# Patient Record
Sex: Female | Born: 1970
Health system: Southern US, Community
[De-identification: ages and names within clinical notes are randomized; demographics above are authoritative.]

## PROBLEM LIST (undated history)

## (undated) DIAGNOSIS — I341 Nonrheumatic mitral (valve) prolapse: Secondary | ICD-10-CM

## (undated) DIAGNOSIS — T753XXA Motion sickness, initial encounter: Secondary | ICD-10-CM

## (undated) DIAGNOSIS — I890 Lymphedema, not elsewhere classified: Secondary | ICD-10-CM

## (undated) DIAGNOSIS — K621 Rectal polyp: Secondary | ICD-10-CM

## (undated) DIAGNOSIS — Q784 Enchondromatosis: Secondary | ICD-10-CM

## (undated) DIAGNOSIS — IMO0001 Reserved for inherently not codable concepts without codable children: Secondary | ICD-10-CM

## (undated) DIAGNOSIS — R011 Cardiac murmur, unspecified: Secondary | ICD-10-CM

## (undated) DIAGNOSIS — F419 Anxiety disorder, unspecified: Secondary | ICD-10-CM

## (undated) DIAGNOSIS — F319 Bipolar disorder, unspecified: Secondary | ICD-10-CM

## (undated) DIAGNOSIS — Z923 Personal history of irradiation: Secondary | ICD-10-CM

## (undated) DIAGNOSIS — Z9889 Other specified postprocedural states: Secondary | ICD-10-CM

## (undated) DIAGNOSIS — K311 Adult hypertrophic pyloric stenosis: Secondary | ICD-10-CM

## (undated) DIAGNOSIS — N941 Unspecified dyspareunia: Secondary | ICD-10-CM

## (undated) DIAGNOSIS — Z9221 Personal history of antineoplastic chemotherapy: Secondary | ICD-10-CM

## (undated) DIAGNOSIS — Z973 Presence of spectacles and contact lenses: Secondary | ICD-10-CM

## (undated) DIAGNOSIS — Z124 Encounter for screening for malignant neoplasm of cervix: Secondary | ICD-10-CM

## (undated) DIAGNOSIS — D509 Iron deficiency anemia, unspecified: Secondary | ICD-10-CM

## (undated) DIAGNOSIS — K4090 Unilateral inguinal hernia, without obstruction or gangrene, not specified as recurrent: Secondary | ICD-10-CM

## (undated) DIAGNOSIS — R112 Nausea with vomiting, unspecified: Secondary | ICD-10-CM

## (undated) DIAGNOSIS — N921 Excessive and frequent menstruation with irregular cycle: Secondary | ICD-10-CM

## (undated) DIAGNOSIS — G43909 Migraine, unspecified, not intractable, without status migrainosus: Secondary | ICD-10-CM

## (undated) DIAGNOSIS — K219 Gastro-esophageal reflux disease without esophagitis: Secondary | ICD-10-CM

## (undated) HISTORY — PX: NASAL SINUS SURGERY: SHX719

## (undated) HISTORY — DX: Bipolar disorder, unspecified: F31.9

## (undated) HISTORY — DX: Adult hypertrophic pyloric stenosis: K31.1

## (undated) HISTORY — DX: Migraine, unspecified, not intractable, without status migrainosus: G43.909

## (undated) HISTORY — PX: ARTHROSCOPIC REPAIR ACL: SUR80

## (undated) HISTORY — PX: LYMPH NODE BIOPSY: SHX201

## (undated) HISTORY — PX: PYLOROPLASTY: SHX418

## (undated) HISTORY — DX: Reserved for inherently not codable concepts without codable children: IMO0001

## (undated) HISTORY — DX: Gastro-esophageal reflux disease without esophagitis: K21.9

## (undated) HISTORY — PX: TUBAL LIGATION: SHX77

## (undated) HISTORY — DX: Anxiety disorder, unspecified: F41.9

## (undated) HISTORY — DX: Encounter for screening for malignant neoplasm of cervix: Z12.4

---
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
## 1997-11-06 ENCOUNTER — Observation Stay (HOSPITAL_COMMUNITY): Admission: RE | Admit: 1997-11-06 | Discharge: 1997-11-07 | Payer: Self-pay | Admitting: Orthopedic Surgery

## 1998-03-04 ENCOUNTER — Other Ambulatory Visit: Admission: RE | Admit: 1998-03-04 | Discharge: 1998-03-04 | Payer: Self-pay | Admitting: Obstetrics and Gynecology

## 2000-03-11 ENCOUNTER — Other Ambulatory Visit: Admission: RE | Admit: 2000-03-11 | Discharge: 2000-03-11 | Payer: Self-pay | Admitting: Obstetrics and Gynecology

## 2002-01-05 HISTORY — PX: TOE SURGERY: SHX1073

## 2004-01-17 ENCOUNTER — Ambulatory Visit: Payer: Self-pay | Admitting: Family Medicine

## 2006-02-17 ENCOUNTER — Encounter (INDEPENDENT_AMBULATORY_CARE_PROVIDER_SITE_OTHER): Payer: Self-pay | Admitting: Gynecology

## 2006-02-17 ENCOUNTER — Ambulatory Visit: Payer: Self-pay | Admitting: Obstetrics & Gynecology

## 2007-01-03 ENCOUNTER — Ambulatory Visit: Payer: Self-pay | Admitting: Gynecology

## 2007-02-23 ENCOUNTER — Ambulatory Visit: Payer: Self-pay | Admitting: Family Medicine

## 2007-02-23 ENCOUNTER — Encounter: Payer: Self-pay | Admitting: Family Medicine

## 2007-03-19 ENCOUNTER — Emergency Department: Payer: Self-pay | Admitting: Emergency Medicine

## 2007-03-30 ENCOUNTER — Ambulatory Visit: Payer: Self-pay | Admitting: Gynecology

## 2007-04-06 ENCOUNTER — Ambulatory Visit: Payer: Self-pay | Admitting: Obstetrics & Gynecology

## 2008-01-06 DIAGNOSIS — Z8614 Personal history of Methicillin resistant Staphylococcus aureus infection: Secondary | ICD-10-CM

## 2008-01-06 HISTORY — DX: Personal history of Methicillin resistant Staphylococcus aureus infection: Z86.14

## 2008-05-07 ENCOUNTER — Ambulatory Visit: Payer: Self-pay | Admitting: Family Medicine

## 2008-05-28 ENCOUNTER — Ambulatory Visit: Payer: Self-pay | Admitting: Family Medicine

## 2010-04-06 DIAGNOSIS — Z124 Encounter for screening for malignant neoplasm of cervix: Secondary | ICD-10-CM

## 2010-04-06 HISTORY — DX: Encounter for screening for malignant neoplasm of cervix: Z12.4

## 2010-05-20 NOTE — Assessment & Plan Note (Signed)
NAME:  Carolyn Clements NO.:  [id number]   MEDICAL RECORD NO.:  [id number]          PATIENT TYPE:  POB   LOCATION:  [facility] at [facility]         FACILITY:  [facility]   PHYSICIAN:  Tinnie Gens, MD        DATE OF BIRTH:  18-Mar-1970   DATE OF SERVICE:  05/28/2008                                  CLINIC NOTE   CHIEF COMPLAINT:  Retained IUD.   HISTORY OF PRESENT ILLNESS:  The patient is a 40 year old gravida 3,  para 2, who had 3 prior C-sections, who has had an IUD in for 5 years.  She desired replacement.  We have previously tried this in the office on  May 08, 2008, and was unable to get a hold of her IUD.  Her strings have  been too short.  Today, we have medicated her with Cytotec and Valium.  She comes back in for a retrial with presence of a crochet hook from the  GYN Clinic.    On exam, the patient's vitals are as in the chart.  She is a well-  nourished female in no acute distress.  Abdomen is soft, nontender, and  nondistended.   PROCEDURE:  The patient was placed in dorsal lithotomy.  The cervix was  visualized with speculum, was cleaned with Betadine and grasped  anteriorly with a single-tooth tenaculum.  An os finder was used to open  cervix.  A crochet hook is passed x2.  The IUD was felt, retrieved for a  little bit but the strings were then able to be easily visualized and  were grasped and the IUD brought out without difficulty.  After this was  done, the uterus was sounded to 8.5 cm and Mirena IUD was placed without  difficulty.  Strings were trimmed to 2.5 cm and speculum and single-  tooth tenaculum were removed from the cervix.  The patient tolerated the  procedure well.   IMPRESSION:  Intrauterine device retrieval with reinsertion.   PLAN:  Follow up in 2 weeks for IUD string check, alternatively the  patient can check for herself.  If she feels the strings, she can cancel  that appointment.           ______________________________  Tinnie Gens, MD     TP/MEDQ  D:  05/28/2008  T:  05/29/2008  Job:  [id number]

## 2010-05-20 NOTE — Assessment & Plan Note (Signed)
NAME:  Carolyn Clements, Carolyn Clements NO.:  [id number]   MEDICAL RECORD NO.:  [id number]          PATIENT TYPE:  POB   LOCATION:  [facility]         FACILITY:  [facility]   PHYSICIAN:  Tinnie Gens, MD        DATE OF BIRTH:  Sep 30, 1970   DATE OF SERVICE:                                  CLINIC NOTE   CHIEF COMPLAINT:  Yearly exam.   HISTORY OF PRESENT ILLNESS:  The patient is a 40 year old gravida 3,  para 2 who is here for yearly exam.  She also complains today she has a  Mirena IUD, it has been in for 4 years.  She has occasional spotting,  but no significant cycles.  Minimal in the way of cramping.  Last Pap  was 02/2006.   PAST MEDICAL HISTORY:  Significant for GERD and multiple enchondromas  which require yearly follow up with bone scan that is done at an outside  office.   PAST SURGICAL HISTORY:  1. She had an enchondroma removed from her great toe.  2. C-section x2.  3. Knee surgery x 3.  4. Pylorus stenosis repair as an infant.  5. Hernia repair age 40, inguinal.   ALLERGIES:  Sulfa. All narcotics, latex and Macrobid.   MEDICATIONS:  1. Omeprazole b.i.d.  2. Loratadine daily.  3. Multivitamin daily.   FAMILY HISTORY:  Lung cancer, prostate cancer, breast cancer.   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  She has social alcohol use.  No tobacco or drug use.   OB HISTORY:  G3, P2, one SAB at 12.  C section times 2.   GYN HISTORY:  No history of abnormal Pap. Cycles as in HPI.   REVIEW OF SYSTEMS:  A 14 point system reviewed, negative for headache,  vision changes, chest pain, shortness of breath, nausea, vomiting,  diarrhea, constipation, blood in stool, blood in urine, vaginal  discharge, breast masses.   PHYSICAL EXAMINATION:  VITALS:  As noted in the chart.  Blood pressure  111/75, weight 147.  GENERAL:  She is a well-nourished, well-developed female in no acute  distress.  HEENT:  Normocephalic,  atraumatic.  Sclerae anicteric.  NECK:  Supple.  Normal thyroid.  LUNGS:  Clear bilaterally.  CV:  Regular rate and rhythm without rales, gallops or murmurs.  ABDOMEN:  Soft, nontender, nondistended.  EXTREMITIES:  No cyanosis, clubbing, edema.  BREASTS:  Symmetric with everted nipples.  No masses.  No  supraclavicular or axillary adenopathy.  GU:  Normal external female genitalia.  BUS is normal.  Vagina is pink  and rugated.  Cervix is nulliparous without lesion.  Uterus was small,  anteverted.  No adnexal mass or tenderness.   IMPRESSION:  GYN exam.   PLAN:  Pap smear today.  Follow up as needed.           ______________________________  Tinnie Gens, MD     TP/MEDQ  D:  02/23/2007  T:  02/24/2007  Job:  [id number]

## 2010-05-20 NOTE — Assessment & Plan Note (Signed)
NAME:  Carolyn Clements NO.:  [id number]   MEDICAL RECORD NO.:  [id number]          PATIENT TYPE:  POB   LOCATION:  [facility]         FACILITY:  [facility]   PHYSICIAN:  Tinnie Gens, MD        DATE OF BIRTH:  13-Aug-1970   DATE OF SERVICE:                                  CLINIC NOTE   CHIEF COMPLAINT:  IUD change.   HISTORY OF PRESENT ILLNESS:  The patient is a 40 year old gravida 3,  para 2, who has had 2 prior C-sections, who has an IUD in place since  June of 2005.  Her IUD is placed by Dr. Mia Creek and strings were unable  to visualize today.   PROCEDURE:  A speculum was placed inside the vagina.  Cervix was noted  to be very small and IUD strings could not be visualized.  A OS Finder  was used to dilate of the cervix and then multiple passes with a  straight and curved grasper were used to try to locate her IUD, however,  cannot be easily located.  The patient tolerated the procedure well,  however, we could not ever get her old IUD to come out.  A second IUD  was not placed because of this, and the patient will be booked in the  OR.   IMPRESSION:  Retained intrauterine device.   PLAN:  The patient's IUD removal with replacement.  We will schedule  this in the OR, where the patient will be more comfortable and further  instruments and ability to dilate her cervix can be used to retrieve her  old IUD.  The patient did undergo ultrasound recently in the hospital  that showed her IUD to be in perfect place, so I suspect it will be an  easy enough procedure.  Once we are able to do that, we will book her in  the next 2-3 weeks.           ______________________________  Tinnie Gens, MD     TP/MEDQ  D:  05/07/2008  T:  05/08/2008  Job:  [id number]

## 2010-08-25 ENCOUNTER — Other Ambulatory Visit: Payer: Self-pay | Admitting: *Deleted

## 2010-08-25 NOTE — Telephone Encounter (Signed)
Ok to refill 

## 2010-08-26 MED ORDER — ALPRAZOLAM ER 0.5 MG PO TB24: 0.5000 mg | ORAL_TABLET | Freq: Every day | ORAL | Status: DC

## 2010-08-26 NOTE — Telephone Encounter (Signed)
Rx called to pharmacy

## 2010-09-05 ENCOUNTER — Other Ambulatory Visit: Payer: Self-pay | Admitting: Internal Medicine

## 2010-09-09 ENCOUNTER — Other Ambulatory Visit: Payer: Self-pay | Admitting: Internal Medicine

## 2010-09-09 MED ORDER — OMEPRAZOLE 20 MG PO CPDR: 20.0000 mg | DELAYED_RELEASE_CAPSULE | Freq: Two times a day (BID) | ORAL | Status: DC

## 2010-09-10 ENCOUNTER — Telehealth: Payer: Self-pay | Admitting: *Deleted

## 2010-09-10 DIAGNOSIS — B373 Candidiasis of vulva and vagina: Secondary | ICD-10-CM

## 2010-09-10 MED ORDER — FLUCONAZOLE 150 MG PO TABS: 150.0000 mg | ORAL_TABLET | Freq: Every day | ORAL | Status: AC

## 2010-09-10 NOTE — Telephone Encounter (Signed)
rx sent to pharmacy

## 2010-09-10 NOTE — Telephone Encounter (Signed)
Patient says that she has a yeast infection and is asking if she can have med called in. She also says that she always requires two pills instead of one. [facility] [place]

## 2010-09-22 ENCOUNTER — Other Ambulatory Visit: Payer: Self-pay | Admitting: Internal Medicine

## 2010-09-23 MED ORDER — BUPROPION HCL ER (SR) 100 MG PO TB12: 100.0000 mg | ORAL_TABLET | Freq: Two times a day (BID) | ORAL | Status: DC

## 2010-09-25 ENCOUNTER — Other Ambulatory Visit: Payer: Self-pay | Admitting: Internal Medicine

## 2010-09-26 ENCOUNTER — Other Ambulatory Visit: Payer: Self-pay | Admitting: *Deleted

## 2010-09-26 MED ORDER — ALPRAZOLAM ER 0.5 MG PO TB24: 0.5000 mg | ORAL_TABLET | Freq: Every day | ORAL | Status: AC

## 2010-09-26 MED ORDER — ALPRAZOLAM 0.5 MG PO TABS: 0.5000 mg | ORAL_TABLET | Freq: Every evening | ORAL | Status: DC | PRN

## 2010-09-26 NOTE — Telephone Encounter (Signed)
Opened in error

## 2010-11-24 ENCOUNTER — Ambulatory Visit (INDEPENDENT_AMBULATORY_CARE_PROVIDER_SITE_OTHER): Payer: BC Managed Care – PPO | Admitting: Internal Medicine

## 2010-11-24 ENCOUNTER — Encounter: Payer: Self-pay | Admitting: Internal Medicine

## 2010-11-24 DIAGNOSIS — Z124 Encounter for screening for malignant neoplasm of cervix: Secondary | ICD-10-CM | POA: Insufficient documentation

## 2010-11-24 DIAGNOSIS — Z538 Procedure and treatment not carried out for other reasons: Secondary | ICD-10-CM | POA: Insufficient documentation

## 2010-11-24 DIAGNOSIS — K5900 Constipation, unspecified: Secondary | ICD-10-CM

## 2010-11-24 DIAGNOSIS — F419 Anxiety disorder, unspecified: Secondary | ICD-10-CM | POA: Insufficient documentation

## 2010-11-24 DIAGNOSIS — K219 Gastro-esophageal reflux disease without esophagitis: Secondary | ICD-10-CM | POA: Insufficient documentation

## 2010-11-24 DIAGNOSIS — G43909 Migraine, unspecified, not intractable, without status migrainosus: Secondary | ICD-10-CM | POA: Insufficient documentation

## 2010-11-24 LAB — TSH: TSH: 1.24 u[IU]/mL (ref 0.35–5.50)

## 2010-11-24 MED ORDER — ALPRAZOLAM ER 0.5 MG PO TB24: 0.5000 mg | ORAL_TABLET | ORAL | Status: AC

## 2010-11-24 MED ORDER — BUPROPION HCL 75 MG PO TABS: 75.0000 mg | ORAL_TABLET | Freq: Two times a day (BID) | ORAL | Status: DC

## 2010-11-24 NOTE — Progress Notes (Signed)
  Subjective:    Patient ID: Carolyn Clements, female    DOB: 05-15-1970, 40 y.o.   MRN: [id number]  HPI   40 yo white female with history of generalized anxiety want sto start getting of of meds..  Has not been using xanax at bedtime ,  Only the XR,  And yusing wellbutrin 100 mg twice daiy.  Notes constipation ,  Weight gain.,  No hair loss or dry skin.   6 lbs despite doing cardio 3 to  5 times week, and teaching kick boxing.   Review of Systems  Constitutional: Positive for unexpected weight change. Negative for fever and chills.  HENT: Negative for hearing loss, ear pain, nosebleeds, congestion, sore throat, facial swelling, rhinorrhea, sneezing, mouth sores, trouble swallowing, neck pain, neck stiffness, voice change, postnasal drip, sinus pressure, tinnitus and ear discharge.   Eyes: Negative for pain, discharge, redness and visual disturbance.  Respiratory: Negative for cough, chest tightness, shortness of breath, wheezing and stridor.   Cardiovascular: Negative for chest pain, palpitations and leg swelling.  Gastrointestinal: Positive for constipation.  Musculoskeletal: Negative for myalgias and arthralgias.  Skin: Negative for color change and rash.  Neurological: Negative for dizziness, weakness, light-headedness and headaches.  Hematological: Negative for adenopathy.       Objective:   Physical Exam  Constitutional: She is oriented to person, place, and time. She appears well-developed and well-nourished.  HENT:  Mouth/Throat: Oropharynx is clear and moist.  Eyes: EOM are normal. Pupils are equal, round, and reactive to light. No scleral icterus.  Neck: Normal range of motion. Neck supple. No JVD present. No thyromegaly present.  Cardiovascular: Normal rate, regular rhythm, normal heart sounds and intact distal pulses.   Pulmonary/Chest: Effort normal and breath sounds normal.  Abdominal: Soft. Bowel sounds are normal. She exhibits no mass. There is no tenderness.    Musculoskeletal: Normal range of motion. She exhibits no edema.  Lymphadenopathy:    She has no cervical adenopathy.  Neurological: She is alert and oriented to person, place, and time.  Skin: Skin is warm and dry.  Psychiatric: She has a normal mood and affect.          Assessment & Plan:  Depression with anxiety features:   Aggravated by loss of father to lung CA ealrier in the year.  She is requesting reductio in medications as a trial.  Will reduce wellbutrin dose in PM to 75 mg, followed by am dose to 75 mg.  Next step would be to stop the alprazolam or continue reducing wellbutrin.    Weight gain: given concurrent constipation, will rule out thyroid dysfunction.  Discussed diet and current exercise regimen

## 2010-11-24 NOTE — Patient Instructions (Signed)
Reduce the afternoon dose of wellbutrin to 75 mg for one week,  Then reduce morning dose to 75 if tolerated.  Try the Atkins chocolate lovers variety of  protein bars at [employer].  (130 cal, 11 g fiber,  Lots of protein,  Low carb).  We will check your thyroid function today

## 2010-11-26 ENCOUNTER — Encounter: Payer: Self-pay | Admitting: Internal Medicine

## 2010-12-11 ENCOUNTER — Encounter: Payer: Self-pay | Admitting: Internal Medicine

## 2011-01-09 ENCOUNTER — Other Ambulatory Visit: Payer: Self-pay | Admitting: Internal Medicine

## 2011-02-23 ENCOUNTER — Telehealth: Payer: Self-pay | Admitting: *Deleted

## 2011-02-23 MED ORDER — ALPRAZOLAM 0.25 MG PO TABS: 0.2500 mg | ORAL_TABLET | Freq: Every evening | ORAL | Status: DC | PRN

## 2011-02-23 MED ORDER — BUPROPION HCL 75 MG PO TABS: 37.5000 mg | ORAL_TABLET | Freq: Two times a day (BID) | ORAL | Status: DC

## 2011-02-23 NOTE — Telephone Encounter (Signed)
The alprazolam in her chart is not extended release it is the short acting,.  The extended does not come in a 0.25 , if she breaks the extended release in half it becomes short acting,

## 2011-02-23 NOTE — Telephone Encounter (Signed)
You can call in the alprazolam at the 0.25 mg dose #30 with 2 refills.  The wellbutrin does not come in a lower dose than 75 mg but I  Have sent a new rx for 1/2 tablet bid to pharmacy

## 2011-02-23 NOTE — Telephone Encounter (Signed)
Pt is asking if she can lower her doses on wellbutrin and xanax.  She is currently taking 75 mg's of wellbutrin twice a day and one .5 mg tablet of extended release xanax.  If her xanax dose is to stay the same she will need a refill called to her pharmacy.  Please advise.

## 2011-02-24 ENCOUNTER — Other Ambulatory Visit: Payer: Self-pay | Admitting: *Deleted

## 2011-02-24 MED ORDER — FLUTICASONE PROPIONATE 50 MCG/ACT NA SUSP: 2.0000 | Freq: Every day | NASAL | Status: DC

## 2011-02-25 ENCOUNTER — Telehealth: Payer: Self-pay | Admitting: Internal Medicine

## 2011-02-25 NOTE — Telephone Encounter (Signed)
Should she stop taking her Alprazolam E R or should she refill.

## 2011-02-25 NOTE — Telephone Encounter (Signed)
Pt states she has both extended release, which she takes every day, and short acting xanax, which she takes very seldom.  She has several of the .5 mg short acting so she will break these in half and take half a tablet every day.  She'll call back in about a month to report on how she's doing.

## 2011-02-25 NOTE — Telephone Encounter (Signed)
Not sure I can answer that without seeing patient,  If she is taking the lowest dose once a day and doesn't freel she needs it,  Then she can stop it.,

## 2011-02-25 NOTE — Telephone Encounter (Signed)
See other phone note from today for more information.

## 2011-04-09 ENCOUNTER — Telehealth: Payer: Self-pay | Admitting: Internal Medicine

## 2011-04-09 DIAGNOSIS — N760 Acute vaginitis: Secondary | ICD-10-CM

## 2011-04-09 MED ORDER — FLUCONAZOLE 150 MG PO TABS: 150.0000 mg | ORAL_TABLET | Freq: Every day | ORAL | Status: AC

## 2011-04-09 NOTE — Telephone Encounter (Signed)
Patient notified of Rx.  

## 2011-04-09 NOTE — Telephone Encounter (Signed)
Office Message [address] [place], [place] [address] p. [phone] f. [phone] To: [facility] (Daytime Triage) Fax: [phone] From: [facility] Date/ Time: 04/09/2011 9:18 AM Taken By: Patriciaann Clan, RN Caller: Anina Facility: Not Collected Patient: Carolyn Clements, Carolyn Clements DOB: 1970-11-19 Phone: [phone] Reason for Call: Caller was unable to be reached on callback - Left Message Regarding Appointment: No Appt Date: Appt Time: Unknown Provider: Reason: Details: Outcome:

## 2011-04-09 NOTE — Telephone Encounter (Signed)
[facility] [place]

## 2011-04-09 NOTE — Telephone Encounter (Signed)
Caller: Dakayla/Mother; PCP: Duncan Dull; CB#: [phone];  LMP-Mirena IUD.  Patient states she developed vaginal itching/irritation,white vaginal discharge. Onset 04/08/11. Patient requesting Rx for Diflucan. States she has hx of yeast infections. Triage per Vaginal Discharge/Irritation Protocol. No emergent sx identified. Care advice given per guidelines. Patient verbalizes understanding.  PATIENT REQUESTING RX FOR DICFLUCAN. [facility] [place] @ [phone]. PATIENT CAN BE REACHED @ [phone].

## 2011-04-09 NOTE — Telephone Encounter (Signed)
[facility] Triage Call Report Triage Record Num: [id number] Operator: Patriciaann Clan Patient Name: Carolyn Clements Call Date & Time: 04/09/2011 11:43:54AM Patient Phone: [phone] PCP: Duncan Dull Patient Gender: Female PCP Fax : [phone] Patient DOB: 04-08-1970 Practice Name: [facility] Reason for Call: Caller: Junior/Mother; PCP: Duncan Dull; CB#: [phone]; LMP-Mirena IUD. Patient states she developed vaginal itching/irritation,white vaginal discharge. Onset 04/08/11. Patient requesting Rx for Diflucan. States she has hx of yeast infections. Triage per Vaginal Discharge/Irritation Protocol. No emergent sx identified. Care advice given per guidelines. Patient verbalizes understanding. PATIENT REQUESTING RX FOR DICFLUCAN. [facility] [place] @ [phone]. PATIENT CAN BE REACHED @ [phone]. Protocol(s) Used: Vaginal Discharge or Irritation Recommended Outcome per Protocol: See Provider within 24 hours Reason for Outcome: Genital itching, burning or redness Care Advice: ~ Keep perineum clean and dry. ~ Call provider if symptoms worsen or new symptoms develop. ~ SYMPTOM / CONDITION MANAGEMENT Refrain from douching, using scented deodorant tampons, or nonprescription medication until evaluated by provider. Do not use feminine hygiene sprays. Use condoms during sex. ~ 04/09/2011 12:06:03PM Page 1 of 1 [email or web address]

## 2011-05-11 ENCOUNTER — Other Ambulatory Visit: Payer: Self-pay | Admitting: Internal Medicine

## 2011-06-03 ENCOUNTER — Telehealth: Payer: Self-pay | Admitting: Internal Medicine

## 2011-06-03 DIAGNOSIS — B373 Candidiasis of vulva and vagina: Secondary | ICD-10-CM

## 2011-06-03 MED ORDER — FLUCONAZOLE 150 MG PO TABS: 150.0000 mg | ORAL_TABLET | Freq: Every day | ORAL | Status: AC

## 2011-06-03 NOTE — Telephone Encounter (Signed)
I sent an rx for oral fuconazole to her pharmacy ,.  Take daily for 2 days,  If not rexolved after this, ask her to  make appt for pelvic exam.

## 2011-06-03 NOTE — Telephone Encounter (Signed)
Caller: Xanthe/Patient; PCP: Duncan Dull; CB#: [phone]; ; ; Call regarding Yeast Infection; notes vaginal irritation 06/02/11.  Per protocol, emergent symptoms denied; advised appt within 24 hours.  Nno appts available with any provider until 06/08/11;  patient reluctant to go to UC for this.  Info to office for provider review/Rx/callback.  [facility] [phone].   MAY REACH PATIENT AT [phone].

## 2011-06-04 NOTE — Telephone Encounter (Signed)
Patient notified

## 2011-06-17 ENCOUNTER — Other Ambulatory Visit: Payer: Self-pay | Admitting: Internal Medicine

## 2011-06-17 MED ORDER — ALPRAZOLAM 0.25 MG PO TABS: 0.2500 mg | ORAL_TABLET | Freq: Every evening | ORAL | Status: DC | PRN

## 2011-07-07 ENCOUNTER — Ambulatory Visit (INDEPENDENT_AMBULATORY_CARE_PROVIDER_SITE_OTHER): Payer: BC Managed Care – PPO | Admitting: Internal Medicine

## 2011-07-07 ENCOUNTER — Encounter: Payer: Self-pay | Admitting: Internal Medicine

## 2011-07-07 VITALS — BP 110/58 | HR 73 | Temp 97.8°F | Resp 16 | Wt 145.2 lb

## 2011-07-07 DIAGNOSIS — L02429 Furuncle of limb, unspecified: Secondary | ICD-10-CM

## 2011-07-07 DIAGNOSIS — F5102 Adjustment insomnia: Secondary | ICD-10-CM

## 2011-07-07 NOTE — Patient Instructions (Signed)
For your insomnia, try taking the alprazolam 0.25 mg when you wake up for the second time.   Will refill your medication once you call to let me know it is working .

## 2011-07-07 NOTE — Assessment & Plan Note (Addendum)
Currently on doxycycline by dermatologist, pending cultures. Discussed using hibiclens once a week and Lever 2000 soap.

## 2011-07-07 NOTE — Progress Notes (Signed)
Patient ID: Carolyn Clements, female   DOB: Jun 08, 1970, 41 y.o.   MRN: [id number]  Patient Active Problem List  Diagnosis  . GERD (gastroesophageal reflux disease)  . Migraine headache  . IUD  . Screening for cervical cancer  . Insomnia, transient    Subjective:  CC:   Chief Complaint  Patient presents with  . Insomnia    HPI:   Carolyn Clements a 41 y.o. female who presents New onset insomnia.  She is waking up every 2 to 3 hours. She has tried earplugs, noise machines, limiting evening water intake. Following all practices of good sleep hygiene.  Off of all meds for the last several months.  She denies any symptoms of restless legs.  No  anxiety issues keeping her awake.  Sleeps in the fetal position. Symptoms also occurred while on vacation.  2nd issue is recent culture of a boil on right knee which was positive for suspected MRSA, and she is now on doxycyline.  This is her second infection.     Past Medical History  Diagnosis Date  . Pyloric stenosis infant    Inguinal Hernia repair  . Anxiety   . GERD (gastroesophageal reflux disease)   . Migraine headache   . IUD     [facility]  Dr Shawnie Pons  . Screening for cervical cancer April 2012    normal PAP     Past Surgical History  Procedure Date  . Arthroscopic repair acl     torn/repaired ACL, followed by patellar fractuer, [employer]  . Cesarean section     2003 and 2004  . Hernia repair 1976  . Pyloroplasty 1974         The following portions of the patient's history were reviewed and updated as appropriate: Allergies, current medications, and problem list.    Review of Systems:   12 Pt  review of systems was negative except those addressed in the HPI,     History   Social History  . Marital Status: Married    Spouse Name: N/A    Number of Children: N/A  . Years of Education: N/A   Occupational History  . physical therapist     Physical Therapist   Social History  Main Topics  . Smoking status: Never Smoker   . Smokeless tobacco: Never Used  . Alcohol Use: Yes  . Drug Use: No  . Sexually Active: Not on file   Other Topics Concern  . Not on file   Social History Narrative  . No narrative on file    Objective:  BP 110/58  Pulse 73  Temp 97.8 F (36.6 C) (Oral)  Resp 16  Wt 145 lb 4 oz (65.885 kg)  SpO2 99%  General appearance: alert, cooperative and appears stated age Ears: normal TM's and external ear canals both ears Throat: lips, mucosa, and tongue normal; teeth and gums normal Neck: no adenopathy, no carotid bruit, supple, symmetrical, trachea midline and thyroid not enlarged, symmetric, no tenderness/mass/nodules Back: symmetric, no curvature. ROM normal. No CVA tenderness. Lungs: clear to auscultation bilaterally Heart: regular rate and rhythm, S1, S2 normal, no murmur, click, rub or gallop Abdomen: soft, non-tender; bowel sounds normal; no masses,  no organomegaly Pulses: 2+ and symmetric Skin: Skin color, texture, turgor normal. No rashes or lesions Lymph nodes: Cervical, supraclavicular, and axillary nodes normal.  Assessment and Plan:  Insomnia, transient Her early morning wakening is cutting her sleep cycle short.  Since she  has tolerated alprazolam in the past, suggested using alprazolam for early waking.   Furuncle of knee Currently on doxycycline by dermatologist, pending cultures. Discussed using hibiclens once a week and Lever 2000 soap.    Updated Medication List Outpatient Encounter Prescriptions as of 07/07/2011  Medication Sig Dispense Refill  . ALPRAZolam (XANAX) 0.25 MG tablet Take 1 tablet (0.25 mg total) by mouth at bedtime as needed for sleep or anxiety.  30 tablet  3  . doxycycline (VIBRAMYCIN) 100 MG capsule Take 100 mg by mouth 2 (two) times daily. X 10 days      . fexofenadine (ALLEGRA) 180 MG tablet Take 180 mg by mouth daily.        . fluticasone (FLONASE) 50 MCG/ACT nasal spray Place 2 sprays into  the nose daily.  16 g  1  . ketotifen (ZADITOR) 0.025 % ophthalmic solution Place 1 drop into both eyes 2 (two) times daily.        . meloxicam (MOBIC) 15 MG tablet Take 15 mg by mouth daily.        . Multiple Vitamin (MULTIVITAMIN) tablet Take 1 tablet by mouth daily.        Marland Kitchen omeprazole (PRILOSEC) 20 MG capsule TAKE 1 CAPSULE (20 MG TOTAL) BY MOUTH 2 (TWO) TIMES DAILY.  60 capsule  3  . Probiotic Product (PROBIOTIC FORMULA PO) Take by mouth.        . DISCONTD: buPROPion (WELLBUTRIN SR) 100 MG 12 hr tablet Take 1 tablet (100 mg total) by mouth 2 (two) times daily.  60 tablet  3  . DISCONTD: buPROPion (WELLBUTRIN) 75 MG tablet Take 0.5 tablets (37.5 mg total) by mouth 2 (two) times daily.  60 tablet  6

## 2011-07-07 NOTE — Assessment & Plan Note (Signed)
Her early morning wakening is cutting her sleep cycle short.  Since she has tolerated alprazolam in the past, suggested using alprazolam for early waking.

## 2011-07-08 ENCOUNTER — Ambulatory Visit: Payer: BC Managed Care – PPO | Admitting: Internal Medicine

## 2011-07-22 ENCOUNTER — Other Ambulatory Visit: Payer: Self-pay | Admitting: Internal Medicine

## 2011-09-09 ENCOUNTER — Other Ambulatory Visit: Payer: Self-pay | Admitting: Internal Medicine

## 2011-10-25 ENCOUNTER — Other Ambulatory Visit: Payer: Self-pay | Admitting: Internal Medicine

## 2011-10-26 NOTE — Telephone Encounter (Signed)
Ok to refill,  Authorized in epic and printed  

## 2011-10-28 NOTE — Telephone Encounter (Signed)
rx called into pharmacy

## 2011-11-04 ENCOUNTER — Telehealth: Payer: Self-pay | Admitting: Internal Medicine

## 2011-11-04 DIAGNOSIS — N760 Acute vaginitis: Secondary | ICD-10-CM

## 2011-11-04 MED ORDER — FLUCONAZOLE 150 MG PO TABS: 150.0000 mg | ORAL_TABLET | Freq: Every day | ORAL | Status: DC

## 2011-11-04 NOTE — Telephone Encounter (Signed)
LMP none/IUD.  Caller: Carolyn Clements/Patient; Patient Name: Carolyn Clements; PCP: Duncan Dull (Adults only); Best Callback Phone Number: [phone].  Patient calling about symptoms of yeast infection.  Onset 10/31/11.  States she has these a couple of times a year.  c/o itching, irritation.  Per protocol, emergent symptoms denied; advised appt within 24 hours.  Declines; states Dr. Darrick Huntsman usually calls something in for her.  info to office for provider review/Rx/callback. [facility]  [phone].   May reach patient at [phone] cell/may leave message if she is with a patient.

## 2011-11-04 NOTE — Telephone Encounter (Signed)
[facility] [place]

## 2012-01-07 ENCOUNTER — Ambulatory Visit (INDEPENDENT_AMBULATORY_CARE_PROVIDER_SITE_OTHER): Payer: BC Managed Care – PPO | Admitting: Internal Medicine

## 2012-01-07 ENCOUNTER — Encounter: Payer: Self-pay | Admitting: Internal Medicine

## 2012-01-07 VITALS — BP 96/64 | HR 70 | Temp 98.0°F | Resp 16 | Ht 63.0 in | Wt 146.0 lb

## 2012-01-07 DIAGNOSIS — F411 Generalized anxiety disorder: Secondary | ICD-10-CM

## 2012-01-07 DIAGNOSIS — F419 Anxiety disorder, unspecified: Secondary | ICD-10-CM

## 2012-01-07 MED ORDER — SERTRALINE HCL 50 MG PO TABS: 50.0000 mg | ORAL_TABLET | Freq: Every day | ORAL | Status: DC

## 2012-01-07 MED ORDER — ALPRAZOLAM 0.25 MG PO TABS: 0.2500 mg | ORAL_TABLET | Freq: Every evening | ORAL | Status: DC | PRN

## 2012-01-07 NOTE — Patient Instructions (Addendum)
Start the sertraline at 1/2 tablet daily  With dinner,  And   Increase to 1 tablet after 1 week   Continue wellbutrin in the morning only  for one week , then every other day for one week then stop   Continue alprazolam as needed

## 2012-01-07 NOTE — Progress Notes (Signed)
Patient ID: Carolyn Clements, female   DOB: Mar 08, 1970, 42 y.o.   MRN: [id number]  Patient Active Problem List  Diagnosis  . GERD (gastroesophageal reflux disease)  . Migraine headache  . IUD  . Screening for cervical cancer  . Insomnia, transient  . Furuncle of knee  . Generalized anxiety disorder    Subjective:  CC:   Chief Complaint  Patient presents with  . sleep problems  . discuss medication    HPI:   Tomorrow Avent Mimsis a 42 y.o. female who presents Uncontrolled anxiety. Patient weaned herself off of the Wellbutrin several months ago as directed but noticed that after 2 months she was having increased problems with concentration and increased irritability. She is tolerating a morning dose but notes when she takes the afternoon dose she has increased insomnia. She still using the Xanax to initiate sleep. 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 She has been doing a lot of reflecting on her childhood and her current emotional state. She is concerned about her interaction with her children being too controlling and recognizes that her childhood was very controlled. She states that she has been 42 years old since she was a child. She remembers never been allowed adequate a child but was constantly being covered by her father and corrected and regulated to prevent her from making any mistakes.  Her first concern is being a super wife and super mom and she frequently and neglects herself. 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  Her mother recently made the comment that she  she has a more relaxed style with her children and with her up to the house. Rather than appreciated a comment she found that observation somehow an  indictment on her housekeeping skills.  She and her husband have been  working overtime for the last 6 months and do not have much time by themselves.  Mostly Dad who was in charge but not often there. Dad was Buyer, retail,  Mom was stay at home mom and appeased Dad.  "dad was my best friend" and she is just like him. She does does not have any signs or symptoms of obsessive-compulsive behaviors but does have a compulsive personality and is resentful that her childhood was not more spontaneous. She does not want to make the same stick with her children it she is annoyed by her children's failure to act responsibly. Children are 48 and 61 years old. She is requesting pharmacotherapy and referral to psychotherapy    Past Medical History  Diagnosis Date  . Pyloric stenosis infant    Inguinal Hernia repair  . Anxiety   . GERD (gastroesophageal reflux disease)   . Migraine headache   . IUD     [facility]  Dr Shawnie Pons  . Screening for cervical cancer April 2012    normal PAP     Past Surgical History  Procedure Date  . Arthroscopic repair acl     torn/repaired ACL, followed by patellar fractuer, [employer] school [employer]  . Cesarean section     2003 and 2004  . Hernia repair 1976  . Pyloroplasty 1974         The following portions of the patient's history were reviewed and updated  as appropriate: Allergies, current medications, and problem list.    Review of Systems:   12 Pt  review of systems was negative except those addressed in the HPI,     History   Social History  . Marital Status: Married    Spouse Name: N/A    Number of Children: N/A  . Years of Education: N/A   Occupational History  . physical therapist     Physical Therapist   Social History Main Topics  . Smoking status: Never Smoker   . Smokeless tobacco: Never Used  . Alcohol Use: 1.8 oz/week    3 Shots of liquor per week  . Drug Use: No  . Sexually Active: Not on file   Other Topics Concern  . Not on file   Social History Narrative  . No narrative on  file    Objective:  BP 96/64  Pulse 70  Temp 98 F (36.7 C) (Oral)  Resp 16  Ht 5\' 3"  (1.6 m)  Wt 146 lb (66.225 kg)  BMI 25.86 kg/m2  SpO2 97%  General appearance: alert, cooperative and appears stated age Ears: normal TM's and external ear canals both ears Throat: lips, mucosa, and tongue normal; teeth and gums normal Neck: no adenopathy, no carotid bruit, supple, symmetrical, trachea midline and thyroid not enlarged, symmetric, no tenderness/mass/nodules Back: symmetric, no curvature. ROM normal. No CVA tenderness. Lungs: clear to auscultation bilaterally Heart: regular rate and rhythm, S1, S2 normal, no murmur, click, rub or gallop Abdomen: soft, non-tender; bowel sounds normal; no masses,  no organomegaly Pulses: 2+ and symmetric Skin: Skin color, texture, turgor normal. No rashes or lesions Lymph nodes: Cervical, supraclavicular, and axillary nodes normal. Psych: tearful, makes eye contact.  Affect appropriate.   Ahssessand looking ment and Plan:  Generalized anxiety disorder To 30 minutes discussing her current symptoms , her childhood memories and her relationship with her father. She has obsessive-compulsive personality traits and controlling behavior. Up to this point in her life these traits have been beneficial in achieving career goals but are currently interfering with her happiness sleep cycle and interaction with her children. We discussed at length today how this  can be managed. I'm recommending a trial of sertraline and a two week  tapering off of the Wellbutrin. I have also referred her for psychotherapy. In   Updated Medication List Outpatient Encounter Prescriptions as of 01/07/2012  Medication Sig Dispense Refill  . ALPRAZolam (XANAX) 0.25 MG tablet Take 1 tablet (0.25 mg total) by mouth at bedtime as needed for sleep.  30 tablet  3  . fexofenadine (ALLEGRA) 180 MG tablet Take 180 mg by mouth daily.        . fluticasone (FLONASE) 50 MCG/ACT nasal spray  Place 2 sprays into the nose daily.  16 g  1  . ketotifen (ZADITOR) 0.025 % ophthalmic solution Place 1 drop into both eyes 2 (two) times daily.        . meloxicam (MOBIC) 15 MG tablet Take 15 mg by mouth daily.        . Multiple Vitamin (MULTIVITAMIN) tablet Take 1 tablet by mouth daily.        Marland Kitchen omeprazole (PRILOSEC) 20 MG capsule TAKE 1 CAPSULE (20 MG TOTAL) BY MOUTH 2 (TWO) TIMES DAILY.  60 capsule  3  . Probiotic Product (PROBIOTIC FORMULA PO) Take by mouth.        . [DISCONTINUED] ALPRAZolam (XANAX) 0.25 MG tablet TAKE 1 TABLET BY MOUTH AT BEDTIME AS NEEDED FOR  ANXIETY  30 tablet  3  . [DISCONTINUED] buPROPion (WELLBUTRIN SR) 100 MG 12 hr tablet TAKE 1 TABLET BY MOUTH TWICE A DAY  60 tablet  3  . [DISCONTINUED] buPROPion (WELLBUTRIN SR) 100 MG 12 hr tablet       . doxycycline (VIBRAMYCIN) 100 MG capsule Take 100 mg by mouth 2 (two) times daily. X 10 days      . fluconazole (DIFLUCAN) 150 MG tablet Take 1 tablet (150 mg total) by mouth daily.  2 tablet  0  . sertraline (ZOLOFT) 50 MG tablet Take 1 tablet (50 mg total) by mouth daily.  30 tablet  3     Orders Placed This Encounter  Procedures  . Ambulatory referral to Psychology    No Follow-up on file.

## 2012-01-07 NOTE — Assessment & Plan Note (Addendum)
To 30 minutes discussing her current symptoms , her childhood memories and her relationship with her father. She has obsessive-compulsive personality traits and controlling behavior. Up to this point in her life these traits have been beneficial in achieving career goals but are currently interfering with her happiness sleep cycle and interaction with her children. We discussed at length today how this  can be managed. I'm recommending a trial of sertraline and a two week  tapering off of the Wellbutrin. I have also referred her for psychotherapy. In

## 2012-01-08 ENCOUNTER — Other Ambulatory Visit: Payer: Self-pay | Admitting: Internal Medicine

## 2012-02-03 ENCOUNTER — Ambulatory Visit: Payer: BC Managed Care – PPO | Admitting: Psychology

## 2012-02-15 ENCOUNTER — Ambulatory Visit (INDEPENDENT_AMBULATORY_CARE_PROVIDER_SITE_OTHER): Payer: BC Managed Care – PPO | Admitting: Psychology

## 2012-02-15 DIAGNOSIS — F411 Generalized anxiety disorder: Secondary | ICD-10-CM

## 2012-02-22 ENCOUNTER — Encounter: Payer: Self-pay | Admitting: Internal Medicine

## 2012-02-22 DIAGNOSIS — F411 Generalized anxiety disorder: Secondary | ICD-10-CM

## 2012-03-22 ENCOUNTER — Ambulatory Visit (INDEPENDENT_AMBULATORY_CARE_PROVIDER_SITE_OTHER): Payer: BC Managed Care – PPO | Admitting: Adult Health

## 2012-03-22 ENCOUNTER — Encounter: Payer: Self-pay | Admitting: Adult Health

## 2012-03-22 VITALS — BP 103/52 | HR 69 | Temp 98.2°F | Resp 14 | Ht 63.0 in | Wt 143.0 lb

## 2012-03-22 DIAGNOSIS — R42 Dizziness and giddiness: Secondary | ICD-10-CM

## 2012-03-22 DIAGNOSIS — R259 Unspecified abnormal involuntary movements: Secondary | ICD-10-CM

## 2012-03-22 DIAGNOSIS — R251 Tremor, unspecified: Secondary | ICD-10-CM | POA: Insufficient documentation

## 2012-03-22 NOTE — Assessment & Plan Note (Signed)
Symptoms occuring when changing position. Patient's b/p is low and may be causing her problem. She is not drinking enough fluid. 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 She purposefully limits her fluid intake because of lack of facilities while she is out working with patients. Orthostatic b/p were done with dropping of systolic & diastolic pressures with changes mainly from sitting to standing. Also did from squatting to standing with the most drop noted (see v/s). Checking labs: metabolic panel, cbc

## 2012-03-22 NOTE — Patient Instructions (Addendum)
  Please have your labs drawn prior to leaving the office.  I will notify you of the results once they are available.  Have your husband observe to see if your tremors also occur during rest.

## 2012-03-22 NOTE — Assessment & Plan Note (Addendum)
Patient noticed tremors approximately 3 months ago with progressing symptoms. Changes to medications as per HPI. Uncertain if the tremors may have increased with these new meds. I will call Dr. Maryruth Bun and discuss this with her. Baseline ECG done secondary to her symptoms of lightheadedness. ECG was normal. Will check labs: TSH, cbc, metabolic panel, mag level.

## 2012-03-22 NOTE — Progress Notes (Signed)
  Subjective:    Patient ID: Carolyn Clements, female    DOB: January 29, 1970, 42 y.o.   MRN: [id number]  HPI  Patient is a pleasant 42 year old female with history of generalized anxiety disorder who presents to clinic with complaints of tremors. These tremors are of the upper extremities and also in the muscles of her thighs. She first noticed them approximately 3 months ago. They have become progressively worse. Within the last 6 weeks she has noticed other symptoms such as sitting to standing head rush with lightheadedness, buzzing in both years, decreased appetite, she feels her eyes are not focusing well, decreased sleep, decreased endurance and stamina while doing cardio exercise. She has not noticed whether the tremors occur at rest.  Patient was recently taken off Zoloft and Xanax. She was started on trazodone and Effexor. Patient has been on Wellbutrin and she reports that this may be discontinued at a later visit. Medication changes were done by Dr. Maryruth Bun.   Current Outpatient Prescriptions on File Prior to Visit  Medication Sig Dispense Refill  . fexofenadine (ALLEGRA) 180 MG tablet Take 180 mg by mouth daily.        . fluticasone (FLONASE) 50 MCG/ACT nasal spray Place 2 sprays into the nose daily.  16 g  1  . ketotifen (ZADITOR) 0.025 % ophthalmic solution Place 1 drop into both eyes 2 (two) times daily.        . meloxicam (MOBIC) 15 MG tablet Take 15 mg by mouth daily.        Marland Kitchen omeprazole (PRILOSEC) 20 MG capsule TAKE 1 CAPSULE (20 MG TOTAL) BY MOUTH 2 (TWO) TIMES DAILY.  60 capsule  3  . Probiotic Product (PROBIOTIC FORMULA PO) Take by mouth.        . fluconazole (DIFLUCAN) 150 MG tablet Take 1 tablet (150 mg total) by mouth daily.  2 tablet  0   No current facility-administered medications on file prior to visit.    Review of Systems  Constitutional: Positive for appetite change. Negative for fever and fatigue.  HENT: Positive for sinus pressure.   Eyes:       She feels her eyes  are unable to focus  Respiratory: Negative for cough, chest tightness, shortness of breath and wheezing.   Cardiovascular: Negative for chest pain and palpitations.  Neurological: Positive for tremors and light-headedness. Negative for syncope and headaches.  Psychiatric/Behavioral: The patient is nervous/anxious.    BP 103/52  Pulse 69  Temp(Src) 98.2 F (36.8 C) (Oral)  Resp 14  Ht 5\' 3"  (1.6 m)  Wt 143 lb (64.864 kg)  BMI 25.34 kg/m2  SpO2 100%    Objective:   Physical Exam  Constitutional: She is oriented to person, place, and time. She appears well-developed and well-nourished.  Appears slightly anxious  HENT:  Head: Normocephalic and atraumatic.  Musculoskeletal: Normal range of motion.  Neurological: She is alert and oriented to person, place, and time. She exhibits normal muscle tone. Coordination normal.  Upper extremity tremors noticed with arms extended out. Thigh tremor notice while patient sitting and legs in 90 degree angle.  Skin: Skin is warm and dry.  Psychiatric: She has a normal mood and affect. Her behavior is normal. Judgment and thought content normal.          Assessment & Plan:

## 2012-03-23 LAB — CBC WITH DIFFERENTIAL/PLATELET
Basophils Absolute: 0 10*3/uL (ref 0.0–0.1)
Eosinophils Relative: 2.2 % (ref 0.0–5.0)
HCT: 35 % — ABNORMAL LOW (ref 36.0–46.0)
Hemoglobin: 11.8 g/dL — ABNORMAL LOW (ref 12.0–15.0)
Lymphs Abs: 1.4 10*3/uL (ref 0.7–4.0)
Monocytes Relative: 8 % (ref 3.0–12.0)
Neutro Abs: 3.5 10*3/uL (ref 1.4–7.7)
RDW: 12.8 % (ref 11.5–14.6)

## 2012-03-23 LAB — COMPREHENSIVE METABOLIC PANEL
AST: 20 U/L (ref 0–37)
Albumin: 4 g/dL (ref 3.5–5.2)
Alkaline Phosphatase: 51 U/L (ref 39–117)
Potassium: 4.1 mEq/L (ref 3.5–5.1)
Sodium: 136 mEq/L (ref 135–145)
Total Bilirubin: 0.8 mg/dL (ref 0.3–1.2)
Total Protein: 7.6 g/dL (ref 6.0–8.3)

## 2012-03-23 LAB — MAGNESIUM: Magnesium: 1.8 mg/dL (ref 1.5–2.5)

## 2012-04-20 ENCOUNTER — Ambulatory Visit: Payer: BC Managed Care – PPO | Admitting: Adult Health

## 2012-05-03 ENCOUNTER — Telehealth: Payer: Self-pay

## 2012-05-03 DIAGNOSIS — R251 Tremor, unspecified: Secondary | ICD-10-CM

## 2012-05-03 NOTE — Telephone Encounter (Signed)
Referral is in process as requested 

## 2012-05-03 NOTE — Telephone Encounter (Signed)
Notified patient that her referral is in process

## 2012-05-03 NOTE — Telephone Encounter (Signed)
Patient left message on voicemail regarding her tremors. Patient stated that since her last visit on 3/18 they have gotten a little better but she is still having the tremors. She wants to know do she need to come in or do you think she should have a MRI done. Was seen last by Raquel but she wanted to know your opinion on what she should do.

## 2012-05-03 NOTE — Telephone Encounter (Signed)
i would not order an MRI for tremors.,  I would refer to neurology.  Please let me know if she wants to proceed,

## 2012-05-03 NOTE — Telephone Encounter (Signed)
Called patient back and left message on voicemail for her to return call

## 2012-05-03 NOTE — Telephone Encounter (Signed)
Patient returned call informed her that you would refer to neurology. Pt states that is fine and if possible she would like to see someone in the [facility] area first but if not able [place] area would be her second choice.

## 2012-05-09 ENCOUNTER — Other Ambulatory Visit: Payer: Self-pay | Admitting: Internal Medicine

## 2012-05-09 NOTE — Telephone Encounter (Signed)
Rx sent to pharmacy by escript  

## 2012-06-02 ENCOUNTER — Ambulatory Visit: Payer: Self-pay | Admitting: Otolaryngology

## 2012-06-15 ENCOUNTER — Ambulatory Visit: Payer: Self-pay | Admitting: Neurology

## 2012-07-26 ENCOUNTER — Ambulatory Visit: Payer: Self-pay | Admitting: Otolaryngology

## 2012-07-26 LAB — CLOSTRIDIUM DIFFICILE BY PCR

## 2012-07-29 ENCOUNTER — Encounter: Payer: Self-pay | Admitting: Adult Health

## 2012-07-29 ENCOUNTER — Ambulatory Visit (INDEPENDENT_AMBULATORY_CARE_PROVIDER_SITE_OTHER): Payer: BC Managed Care – PPO | Admitting: Adult Health

## 2012-07-29 VITALS — BP 96/56 | HR 63 | Temp 98.7°F | Resp 12 | Wt 137.0 lb

## 2012-07-29 DIAGNOSIS — J029 Acute pharyngitis, unspecified: Secondary | ICD-10-CM

## 2012-07-29 DIAGNOSIS — R197 Diarrhea, unspecified: Secondary | ICD-10-CM | POA: Insufficient documentation

## 2012-07-29 LAB — CBC WITH DIFFERENTIAL/PLATELET
Basophils Absolute: 0.1 10*3/uL (ref 0.0–0.1)
Basophils Relative: 0.6 % (ref 0.0–3.0)
HCT: 34.1 % — ABNORMAL LOW (ref 36.0–46.0)
Hemoglobin: 11.2 g/dL — ABNORMAL LOW (ref 12.0–15.0)
Lymphs Abs: 1.2 10*3/uL (ref 0.7–4.0)
MCHC: 32.8 g/dL (ref 30.0–36.0)
Monocytes Relative: 9.1 % (ref 3.0–12.0)
Neutro Abs: 6.4 10*3/uL (ref 1.4–7.7)
RBC: 3.82 Mil/uL — ABNORMAL LOW (ref 3.87–5.11)
RDW: 14.4 % (ref 11.5–14.6)

## 2012-07-29 NOTE — Progress Notes (Signed)
  Subjective:    Patient ID: Carolyn Clements, female    DOB: [birth year], 42 y.o.   MRN: [id number]  HPI  Patient presents s/p sinus surgery and sgy for deviated septum in May. She developed postop sinusitus and was started on antibiotics and prednisone. In the beginning of July she was again started on a 12 day prednisone taper. She started to have loose stools. Reports this has been ongoing for 2.5 weeks. She was running a low grade fever from 7/18 through 7/22. Now also with sore throat. She reports that ENT doctor checked for c-diff and came back negative. She adds that she was recently started on lamictal for diagnosis of mild bipolar. Dose was increased approximately 1 week prior to her symptom of diarrhea.   Current Outpatient Prescriptions on File Prior to Visit  Medication Sig Dispense Refill  . ketotifen (ZADITOR) 0.025 % ophthalmic solution Place 1 drop into both eyes 2 (two) times daily.        . fexofenadine (ALLEGRA) 180 MG tablet 180 mg once a week.       Marland Kitchen omeprazole (PRILOSEC) 20 MG capsule TAKE 1 CAPSULE (20 MG TOTAL) BY MOUTH 2 (TWO) TIMES DAILY.  60 capsule  5  . Probiotic Product (PROBIOTIC FORMULA PO) Take by mouth.         No current facility-administered medications on file prior to visit.    Review of Systems  Constitutional: Positive for fever.  HENT: Positive for sore throat.   Respiratory: Negative.   Cardiovascular: Negative.   Gastrointestinal: Positive for diarrhea.  Psychiatric/Behavioral: Negative.        Objective:   Physical Exam  Constitutional: She is oriented to person, place, and time. She appears well-developed and well-nourished. No distress.  Cardiovascular: Normal rate and regular rhythm.   Pulmonary/Chest: Effort normal. No respiratory distress.  Abdominal: Soft. She exhibits no distension and no mass. There is no tenderness. There is no rebound and no guarding.  Hyperactive bowel sounds  Lymphadenopathy:    She has no cervical  adenopathy.  Neurological: She is alert and oriented to person, place, and time.  Skin: Skin is warm and dry.  Psychiatric: She has a normal mood and affect. Her behavior is normal. Judgment and thought content normal.          Assessment & Plan:

## 2012-07-29 NOTE — Assessment & Plan Note (Signed)
This may be multifactorial including common side effect of Lamictal especially with increasing dose. This medication is new to patient. Check CBC, stool for C. difficile, ova and parasite and culture.

## 2012-07-29 NOTE — Assessment & Plan Note (Signed)
Suspect viral. Culture for strep negative. Continue to drink fluids, may use Chloraseptic spray and gargle with salt water solution. RTC if symptoms are not improved within 3-4 days.

## 2012-07-30 LAB — CLOSTRIDIUM DIFFICILE EIA: CDIFTX: NEGATIVE

## 2012-08-01 ENCOUNTER — Encounter: Payer: Self-pay | Admitting: Internal Medicine

## 2012-08-01 LAB — STOOL CULTURE

## 2012-08-02 ENCOUNTER — Encounter: Payer: Self-pay | Admitting: Adult Health

## 2012-11-08 ENCOUNTER — Other Ambulatory Visit: Payer: Self-pay | Admitting: Internal Medicine

## 2012-11-10 ENCOUNTER — Other Ambulatory Visit: Payer: Self-pay

## 2012-11-11 ENCOUNTER — Other Ambulatory Visit: Payer: Self-pay | Admitting: Internal Medicine

## 2012-11-22 ENCOUNTER — Encounter: Payer: Self-pay | Admitting: Internal Medicine

## 2012-11-22 ENCOUNTER — Ambulatory Visit (INDEPENDENT_AMBULATORY_CARE_PROVIDER_SITE_OTHER): Payer: BC Managed Care – PPO | Admitting: Internal Medicine

## 2012-11-22 VITALS — BP 104/68 | HR 69 | Temp 98.7°F | Resp 12 | Ht 63.0 in | Wt 132.8 lb

## 2012-11-22 DIAGNOSIS — D509 Iron deficiency anemia, unspecified: Secondary | ICD-10-CM

## 2012-11-22 DIAGNOSIS — R5381 Other malaise: Secondary | ICD-10-CM

## 2012-11-22 DIAGNOSIS — N390 Urinary tract infection, site not specified: Secondary | ICD-10-CM | POA: Insufficient documentation

## 2012-11-22 LAB — COMPREHENSIVE METABOLIC PANEL
ALT: 9 U/L (ref 0–35)
AST: 18 U/L (ref 0–37)
Alkaline Phosphatase: 50 U/L (ref 39–117)
BUN: 15 mg/dL (ref 6–23)
CO2: 30 mEq/L (ref 19–32)
Calcium: 9.1 mg/dL (ref 8.4–10.5)
Chloride: 101 mEq/L (ref 96–112)
Creatinine, Ser: 0.7 mg/dL (ref 0.4–1.2)
GFR: 92.35 mL/min (ref >60.00)
Total Bilirubin: 0.7 mg/dL (ref 0.3–1.2)

## 2012-11-22 LAB — CBC WITH DIFFERENTIAL/PLATELET
Basophils Relative: 0.3 % (ref 0.0–3.0)
Eosinophils Absolute: 0 10*3/uL (ref 0.0–0.7)
Eosinophils Relative: 0.4 % (ref 0.0–5.0)
Hemoglobin: 11.7 g/dL — ABNORMAL LOW (ref 12.0–15.0)
Lymphocytes Relative: 16.7 % (ref 12.0–46.0)
Lymphs Abs: 1.4 10*3/uL (ref 0.7–4.0)
MCV: 86 fl (ref 78.0–100.0)
Monocytes Relative: 6.2 % (ref 3.0–12.0)
Neutro Abs: 6.5 10*3/uL (ref 1.4–7.7)
RBC: 3.98 Mil/uL (ref 3.87–5.11)
RDW: 13.8 % (ref 11.5–14.6)
WBC: 8.5 10*3/uL (ref 4.5–10.5)

## 2012-11-22 LAB — POCT URINALYSIS DIPSTICK
Glucose, UA: NEGATIVE
Spec Grav, UA: 1.01
Urobilinogen, UA: 0.2

## 2012-11-22 LAB — IRON AND TIBC
%SAT: 14 % — ABNORMAL LOW (ref 20–55)
TIBC: 362 ug/dL (ref 250–470)
UIBC: 310 ug/dL (ref 125–400)

## 2012-11-22 MED ORDER — CIPROFLOXACIN HCL 250 MG PO TABS: 250.0000 mg | ORAL_TABLET | Freq: Two times a day (BID) | ORAL | Status: DC

## 2012-11-22 NOTE — Progress Notes (Signed)
Pre-visit discussion using our clinic review tool. No additional management support is needed unless otherwise documented below in the visit note.  

## 2012-11-22 NOTE — Progress Notes (Signed)
Patient ID: Carolyn Clements, female   DOB: 01-17-1970, 42 y.o.   MRN: [id number]   Patient Active Problem List   Diagnosis Date Noted  . Anemia, iron deficiency 11/22/2012  . UTI (urinary tract infection) 11/22/2012  . Frequent loose stools 07/29/2012  . Sore throat 07/29/2012  . Tremor 03/22/2012  . Lightheadedness 03/22/2012  . Generalized anxiety disorder 01/07/2012  . Insomnia, transient 07/07/2011  . Furuncle of knee 07/07/2011  . GERD (gastroesophageal reflux disease)   . Migraine headache   . IUD   . Screening for cervical cancer     Subjective:  CC:   Chief Complaint  Patient presents with  . Urinary Tract Infection    polyuria, dysuria at end of flow. Trace of blood this morning.    HPI:   Carolyn Clements a 42 y.o. female who presents with UTI symptoms for 4 days. Frequency,  Slight burniing at the end of the void.  Has been staying at the hospital with her mother.  Feeling run down and dehydrated.  Donated blood in September. Was told her iron was low  after blood donation .Denies heavy menstrual periods.     Past Medical History  Diagnosis Date  . Pyloric stenosis infant    Inguinal Hernia repair  . Anxiety   . GERD (gastroesophageal reflux disease)   . Migraine headache   . IUD     [facility]  Dr Shawnie Pons  . Screening for cervical cancer April 2012    normal PAP     Past Surgical History  Procedure Laterality Date  . Arthroscopic repair acl      torn/repaired ACL, followed by patellar fractuer, high school [employer]  . Cesarean section      2003 and 2004  . Hernia repair  1976  . Pyloroplasty  1974       The following portions of the patient's history were reviewed and updated as appropriate: Allergies, current medications, and problem list.    Review of Systems:   12 Pt  review of systems was negative except those addressed in the HPI,     History   Social History  . Marital Status: Married    Spouse Name: N/A     Number of Children: N/A  . Years of Education: N/A   Occupational History  . physical therapist     Physical Therapist   Social History Main Topics  . Smoking status: Never Smoker   . Smokeless tobacco: Never Used  . Alcohol Use: 1.8 oz/week    3 Shots of liquor per week  . Drug Use: No  . Sexual Activity: Not on file   Other Topics Concern  . Not on file   Social History Narrative  . No narrative on file    Objective:  Filed Vitals:   11/22/12 1425  BP: 104/68  Pulse: 69  Temp: 98.7 F (37.1 C)  Resp: 12     General appearance: alert, cooperative and appears stated age Ears: normal TM's and external ear canals both ears Throat: lips, mucosa, and tongue normal; teeth and gums normal Neck: no adenopathy, no carotid bruit, supple, symmetrical, trachea midline and thyroid not enlarged, symmetric, no tenderness/mass/nodules Back: symmetric, no curvature. ROM normal. No CVA tenderness. Lungs: clear to auscultation bilaterally Heart: regular rate and rhythm, S1, S2 normal, no murmur, click, rub or gallop Abdomen: soft, non-tender; bowel sounds normal; no masses,  no organomegaly Pulses: 2+ and symmetric Skin: Skin color, texture, turgor  normal. No rashes or lesions Lymph nodes: Cervical, supraclavicular, and axillary nodes normal.  Assessment and Plan:  UTI (urinary tract infection) UA is abnormal and she is having dysuria, Empirici cipro   Anemia, iron deficiency History of mild normocytic anemia.  Iron stores ate borderline.  prenatal vitamins recommended.  Lab Results  Component Value Date   WBC 8.5 11/22/2012   HGB 11.7* 11/22/2012   HCT 34.2* 11/22/2012   MCV 86.0 11/22/2012   PLT 178.0 11/22/2012   Lab Results  Component Value Date   FERRITIN 14.6 11/22/2012   Lab Results  Component Value Date   IRON 52 11/22/2012   TIBC 362 11/22/2012   FERRITIN 14.6 11/22/2012    Updated Medication List Outpatient Encounter Prescriptions as of 11/22/2012   Medication Sig  . fexofenadine (ALLEGRA) 180 MG tablet 180 mg daily.   . fluticasone (VERAMYST) 27.5 MCG/SPRAY nasal spray Place 2 sprays into the nose daily.  Marland Kitchen ketotifen (ZADITOR) 0.025 % ophthalmic solution Place 1 drop into both eyes 2 (two) times daily.    Marland Kitchen lamoTRIgine (LAMICTAL) 25 MG tablet Take 75 mg by mouth daily.  Marland Kitchen levonorgestrel (MIRENA) 20 MCG/24HR IUD 1 each by Intrauterine route once.  Marland Kitchen omeprazole (PRILOSEC) 20 MG capsule TAKE ONE CAPSULE BY MOUTH TWICE A DAY  . Probiotic Product (PROBIOTIC FORMULA PO) Take by mouth.    . traZODone (DESYREL) 150 MG tablet Take 150 mg by mouth at bedtime.  . ciprofloxacin (CIPRO) 250 MG tablet Take 1 tablet (250 mg total) by mouth 2 (two) times daily.  . [DISCONTINUED] rimantadine (FLUMADINE) 100 MG tablet Take 100 mg by mouth 2 (two) times daily.     Orders Placed This Encounter  Procedures  . Urine culture  . Fecal occult blood, imunochemical  . Iron and TIBC  . Ferritin  . CBC with Differential  . Comp Met (CMET)  . TSH  . POCT urinalysis dipstick    No Follow-up on file.

## 2012-11-23 ENCOUNTER — Encounter: Payer: Self-pay | Admitting: Internal Medicine

## 2012-11-23 LAB — URINE CULTURE: Colony Count: NO GROWTH

## 2012-11-24 ENCOUNTER — Encounter: Payer: Self-pay | Admitting: Internal Medicine

## 2012-11-24 NOTE — Assessment & Plan Note (Signed)
History of mild normocytic anemia.  Iron stores ate borderline.  prenatal vitamins recommended.  Lab Results  Component Value Date   WBC 8.5 11/22/2012   HGB 11.7* 11/22/2012   HCT 34.2* 11/22/2012   MCV 86.0 11/22/2012   PLT 178.0 11/22/2012   Lab Results  Component Value Date   FERRITIN 14.6 11/22/2012   Lab Results  Component Value Date   IRON 52 11/22/2012   TIBC 362 11/22/2012   FERRITIN 14.6 11/22/2012

## 2012-11-24 NOTE — Assessment & Plan Note (Signed)
UA is abnormal and she is having dysuria, Empirici cipro

## 2013-03-31 ENCOUNTER — Encounter: Payer: Self-pay | Admitting: Family Medicine

## 2013-03-31 ENCOUNTER — Ambulatory Visit (INDEPENDENT_AMBULATORY_CARE_PROVIDER_SITE_OTHER): Payer: BC Managed Care – PPO | Admitting: Family Medicine

## 2013-03-31 VITALS — BP 117/57 | HR 72 | Ht 63.0 in | Wt 124.0 lb

## 2013-03-31 DIAGNOSIS — Z30431 Encounter for routine checking of intrauterine contraceptive device: Secondary | ICD-10-CM

## 2013-03-31 DIAGNOSIS — Z975 Presence of (intrauterine) contraceptive device: Principal | ICD-10-CM

## 2013-03-31 DIAGNOSIS — Z30433 Encounter for removal and reinsertion of intrauterine contraceptive device: Secondary | ICD-10-CM

## 2013-03-31 DIAGNOSIS — Z538 Procedure and treatment not carried out for other reasons: Secondary | ICD-10-CM

## 2013-03-31 NOTE — Progress Notes (Signed)
    Subjective:    Patient ID: Carolyn Clements is a 43 y.o. female presenting with Contraception  on 03/31/2013  HPI: Here for IUD removal and re-insertion.  [facility] and removed on 2nd attempt. IUD re-inserted.  No cycle.  Review of Systems  Constitutional: Negative for fever and chills.  Gastrointestinal: Negative for nausea, vomiting and abdominal pain.  Genitourinary: Negative for dysuria.      Objective:    BP 117/57  Pulse 72  Ht 5\' 3"  (1.6 m)  Wt 124 lb (56.246 kg)  BMI 21.97 kg/m2 Physical Exam  Vitals reviewed. Constitutional: She appears well-developed and well-nourished.  Cardiovascular: Normal rate.   Abdominal: Soft.   Procedure: Speculum placed inside vagina.  Cervix visualized. Strings are not visualized.  Cervix dilated with os finder.  IUD hook used and pt. Could not tolerate further attempts at retrieval in office.  TVUS performed to confirm IUD in uterus.  Appears to be there.      Assessment & Plan:   Attempted IUD removal, unsuccessful Will go to OR and remove with re-insertion.    Return in about 3 months (around 07/01/2013) for postop check.

## 2013-03-31 NOTE — Assessment & Plan Note (Signed)
Will go to OR and remove with re-insertion.

## 2013-03-31 NOTE — Patient Instructions (Signed)
Levonorgestrel intrauterine device (IUD) What is this medicine? LEVONORGESTREL IUD (LEE voe nor jes trel) is a contraceptive (birth control) device. The device is placed inside the uterus by a healthcare professional. It is used to prevent pregnancy and can also be used to treat heavy bleeding that occurs during your period. Depending on the device, it can be used for 3 to 5 years. This medicine may be used for other purposes; ask your health care provider or pharmacist if you have questions. COMMON BRAND NAME(S): Mirena, Skyla What should I tell my health care provider before I take this medicine? They need to know if you have any of these conditions: -abnormal Pap smear -cancer of the breast, uterus, or cervix -diabetes -endometritis -genital or pelvic infection now or in the past -have more than one sexual partner or your partner has more than one partner -heart disease -history of an ectopic or tubal pregnancy -immune system problems -IUD in place -liver disease or tumor -problems with blood clots or take blood-thinners -use intravenous drugs -uterus of unusual shape -vaginal bleeding that has not been explained -an unusual or allergic reaction to levonorgestrel, other hormones, silicone, or polyethylene, medicines, foods, dyes, or preservatives -pregnant or trying to get pregnant -breast-feeding How should I use this medicine? This device is placed inside the uterus by a health care professional. Talk to your pediatrician regarding the use of this medicine in children. Special care may be needed. Overdosage: If you think you have taken too much of this medicine contact a poison control center or emergency room at once. NOTE: This medicine is only for you. Do not share this medicine with others. What if I miss a dose? This does not apply. What may interact with this medicine? Do not take this medicine with any of the following  medications: -amprenavir -bosentan -fosamprenavir This medicine may also interact with the following medications: -aprepitant -barbiturate medicines for inducing sleep or treating seizures -bexarotene -griseofulvin -medicines to treat seizures like carbamazepine, ethotoin, felbamate, oxcarbazepine, phenytoin, topiramate -modafinil -pioglitazone -rifabutin -rifampin -rifapentine -some medicines to treat HIV infection like atazanavir, indinavir, lopinavir, nelfinavir, tipranavir, ritonavir -St. John's wort -warfarin This list may not describe all possible interactions. Give your health care provider a list of all the medicines, herbs, non-prescription drugs, or dietary supplements you use. Also tell them if you smoke, drink alcohol, or use illegal drugs. Some items may interact with your medicine. What should I watch for while using this medicine? Visit your doctor or health care professional for regular check ups. See your doctor if you or your partner has sexual contact with others, becomes HIV positive, or gets a sexual transmitted disease. This product does not protect you against HIV infection (AIDS) or other sexually transmitted diseases. You can check the placement of the IUD yourself by reaching up to the top of your vagina with clean fingers to feel the threads. Do not pull on the threads. It is a good habit to check placement after each menstrual period. Call your doctor right away if you feel more of the IUD than just the threads or if you cannot feel the threads at all. The IUD may come out by itself. You may become pregnant if the device comes out. If you notice that the IUD has come out use a backup birth control method like condoms and call your health care provider. Using tampons will not change the position of the IUD and are okay to use during your period. What side effects may I   notice from receiving this medicine? Side effects that you should report to your doctor or  health care professional as soon as possible: -allergic reactions like skin rash, itching or hives, swelling of the face, lips, or tongue -fever, flu-like symptoms -genital sores -high blood pressure -no menstrual period for 6 weeks during use -pain, swelling, warmth in the leg -pelvic pain or tenderness -severe or sudden headache -signs of pregnancy -stomach cramping -sudden shortness of breath -trouble with balance, talking, or walking -unusual vaginal bleeding, discharge -yellowing of the eyes or skin Side effects that usually do not require medical attention (report to your doctor or health care professional if they continue or are bothersome): -acne -breast pain -change in sex drive or performance -changes in weight -cramping, dizziness, or faintness while the device is being inserted -headache -irregular menstrual bleeding within first 3 to 6 months of use -nausea This list may not describe all possible side effects. Call your doctor for medical advice about side effects. You may report side effects to FDA at 1-800-FDA-1088. Where should I keep my medicine? This does not apply. NOTE: This sheet is a summary. It may not cover all possible information. If you have questions about this medicine, talk to your doctor, pharmacist, or health care provider.  2014, [employer]vier/Gold Standard. (2011-01-22 13:54:04)  

## 2013-05-03 ENCOUNTER — Encounter (HOSPITAL_COMMUNITY): Payer: Self-pay

## 2013-05-08 ENCOUNTER — Encounter (HOSPITAL_COMMUNITY): Payer: Self-pay | Admitting: *Deleted

## 2013-05-11 ENCOUNTER — Ambulatory Visit: Payer: Self-pay | Admitting: Otolaryngology

## 2013-05-12 NOTE — H&P (Signed)
Carolyn Clements is an 43 y.o. No obstetric history on file. Unknown female.   Chief Complaint: IUD removal and re-insertion HPI: Had IUD placed 5 years ago.  Difficulty removing it in the office.  For removal and re-insertion.  Past Medical History  Diagnosis Date  . Pyloric stenosis infant    Inguinal Hernia repair  . Anxiety   . GERD (gastroesophageal reflux disease)   . Migraine headache   . IUD     [facility]  Dr Kennon Rounds  . Screening for cervical cancer April 2012    normal PAP   . PONV (postoperative nausea and vomiting)   . MVP (mitral valve prolapse)     Past Surgical History  Procedure Laterality Date  . Arthroscopic repair acl      torn/repaired ACL, followed by patellar fractuer, [employer]  . Cesarean section      2003 and 2004  . Hernia repair  1976  . Pyloroplasty  1974    Family History  Problem Relation Age of Onset  . Cancer Father     adenocarcinoma, nonsmoker  . Cancer Maternal Grandfather     lung  . BRCA 1/2 Paternal Grandmother   . Cancer Paternal Grandfather     lung  . COPD Mother    Social History:  reports that she has never smoked. She has never used smokeless tobacco. She reports that she drinks about 1.8 ounces of alcohol per week. She reports that she does not use illicit drugs.  Allergies:  Allergies  Allergen Reactions  . Codeine Sulfate Itching and Nausea And Vomiting    Says all narcotics cause itching and she tries to avoid  . Sulfa Antibiotics Hives    Hives and fever at age of 43, has not had since then  . Latex Rash    Not severe, but avoids gloves    No current facility-administered medications on file prior to encounter.   Current Outpatient Prescriptions on File Prior to Encounter  Medication Sig Dispense Refill  . fexofenadine (ALLEGRA) 180 MG tablet Take 180 mg by mouth daily.       Marland Kitchen ketotifen (ZADITOR) 0.025 % ophthalmic solution Place 1 drop into both eyes 2 (two) times daily.         Marland Kitchen levonorgestrel (MIRENA) 20 MCG/24HR IUD 1 each by Intrauterine route once.      Marland Kitchen omeprazole (PRILOSEC) 20 MG capsule TAKE ONE CAPSULE BY MOUTH TWICE A DAY  60 capsule  5  . Probiotic Product (PROBIOTIC FORMULA PO) Take 1 capsule by mouth 2 (two) times daily.       . traZODone (DESYREL) 150 MG tablet Take 150 mg by mouth at bedtime.       ROS: Pertinent items are noted in HPI.  Physical Exam: General appearance: alert, cooperative and appears stated age Neck: supple, symmetrical, trachea midline Lungs: clear to auscultation bilaterally Heart: regular rate and rhythm Abdomen: soft, non-tender; bowel sounds normal; no masses,  no organomegaly Skin: Skin color, texture, turgor normal. No rashes or lesions   Lab Results  Component Value Date   WBC 8.5 11/22/2012   HGB 11.7* 11/22/2012   HCT 34.2* 11/22/2012   MCV 86.0 11/22/2012   PLT 178.0 11/22/2012   No results found for this basename: PREGTESTUR, PREGSERUM, HCG, HCGQUANT     Assessment/Plan Patient Active Problem List   Diagnosis Date Noted  . Anemia, iron deficiency 11/22/2012  . UTI (urinary tract infection) 11/22/2012  . Frequent  loose stools 07/29/2012  . Sore throat 07/29/2012  . Tremor 03/22/2012  . Lightheadedness 03/22/2012  . Generalized anxiety disorder 01/07/2012  . Insomnia, transient 07/07/2011  . Furuncle of knee 07/07/2011  . GERD (gastroesophageal reflux disease)   . Migraine headache   . Attempted IUD removal, unsuccessful   . Screening for cervical cancer    For Dilation and Removal of IUD with re-insertion of IUD.  Carolyn Clements 05/12/2013, 2:21 PM

## 2013-05-17 ENCOUNTER — Ambulatory Visit (HOSPITAL_COMMUNITY): Payer: BC Managed Care – PPO | Admitting: Anesthesiology

## 2013-05-17 ENCOUNTER — Encounter (HOSPITAL_COMMUNITY): Payer: BC Managed Care – PPO | Admitting: Anesthesiology

## 2013-05-17 ENCOUNTER — Encounter (HOSPITAL_COMMUNITY): Payer: Self-pay

## 2013-05-17 ENCOUNTER — Encounter (HOSPITAL_COMMUNITY)
Admission: RE | Disposition: A | Discharge status: Home / Self Care | Payer: Self-pay | Source: Ambulatory Visit | Attending: Family Medicine

## 2013-05-17 ENCOUNTER — Ambulatory Visit (HOSPITAL_COMMUNITY)
Admission: RE | Admit: 2013-05-17 | Discharge: 2013-05-17 | Disposition: A | Discharge status: Home / Self Care | Payer: BC Managed Care – PPO | Source: Ambulatory Visit | Attending: Family Medicine | Admitting: Family Medicine

## 2013-05-17 ENCOUNTER — Other Ambulatory Visit: Payer: Self-pay | Admitting: *Deleted

## 2013-05-17 DIAGNOSIS — Z30433 Encounter for removal and reinsertion of intrauterine contraceptive device: Secondary | ICD-10-CM | POA: Insufficient documentation

## 2013-05-17 DIAGNOSIS — F411 Generalized anxiety disorder: Secondary | ICD-10-CM | POA: Insufficient documentation

## 2013-05-17 DIAGNOSIS — I059 Rheumatic mitral valve disease, unspecified: Secondary | ICD-10-CM | POA: Insufficient documentation

## 2013-05-17 DIAGNOSIS — K219 Gastro-esophageal reflux disease without esophagitis: Secondary | ICD-10-CM | POA: Insufficient documentation

## 2013-05-17 DIAGNOSIS — Z30430 Encounter for insertion of intrauterine contraceptive device: Secondary | ICD-10-CM

## 2013-05-17 HISTORY — DX: Other specified postprocedural states: R11.2

## 2013-05-17 HISTORY — DX: Nonrheumatic mitral (valve) prolapse: I34.1

## 2013-05-17 HISTORY — PX: IUD REMOVAL: SHX5392

## 2013-05-17 HISTORY — DX: Other specified postprocedural states: Z98.890

## 2013-05-17 LAB — PREGNANCY, URINE: Preg Test, Ur: NEGATIVE

## 2013-05-17 SURGERY — REMOVAL, INTRAUTERINE DEVICE
Anesthesia: Monitor Anesthesia Care | Site: Vagina

## 2013-05-17 MED ORDER — MEPERIDINE HCL 25 MG/ML IJ SOLN: 6.2500 mg | INTRAMUSCULAR | Status: DC | PRN

## 2013-05-17 MED ORDER — OMEPRAZOLE 20 MG PO CPDR: DELAYED_RELEASE_CAPSULE | ORAL | Status: DC

## 2013-05-17 MED ORDER — DEXAMETHASONE SODIUM PHOSPHATE 10 MG/ML IJ SOLN
INTRAMUSCULAR | Status: AC
  Filled 2013-05-17: qty 1

## 2013-05-17 MED ORDER — FENTANYL CITRATE 0.05 MG/ML IJ SOLN
INTRAMUSCULAR | Status: AC
  Filled 2013-05-17: qty 2

## 2013-05-17 MED ORDER — METOCLOPRAMIDE HCL 5 MG/ML IJ SOLN: 10.0000 mg | Freq: Once | INTRAMUSCULAR | Status: DC | PRN

## 2013-05-17 MED ORDER — LIDOCAINE HCL 1 % IJ SOLN
INTRAMUSCULAR | Status: AC
  Filled 2013-05-17: qty 20

## 2013-05-17 MED ORDER — KETOROLAC TROMETHAMINE 30 MG/ML IJ SOLN
INTRAMUSCULAR | Status: AC
  Filled 2013-05-17: qty 1

## 2013-05-17 MED ORDER — KETOROLAC TROMETHAMINE 30 MG/ML IJ SOLN
INTRAMUSCULAR | Status: DC | PRN
  Administered 2013-05-17: 30 mg via INTRAVENOUS

## 2013-05-17 MED ORDER — SCOPOLAMINE 1 MG/3DAYS TD PT72
1.0000 | MEDICATED_PATCH | TRANSDERMAL | Status: DC
  Administered 2013-05-17: 1.5 mg via TRANSDERMAL

## 2013-05-17 MED ORDER — PROPOFOL 10 MG/ML IV EMUL
INTRAVENOUS | Status: AC
Start: 2013-05-17 — End: 2013-05-17
  Filled 2013-05-17: qty 20

## 2013-05-17 MED ORDER — PROPOFOL 10 MG/ML IV EMUL
INTRAVENOUS | Status: DC | PRN
  Administered 2013-05-17: 20 mg via INTRAVENOUS
  Administered 2013-05-17: 50 mg via INTRAVENOUS
  Administered 2013-05-17: 30 mg via INTRAVENOUS
  Administered 2013-05-17: 50 mg via INTRAVENOUS

## 2013-05-17 MED ORDER — MIDAZOLAM HCL 2 MG/2ML IJ SOLN
INTRAMUSCULAR | Status: AC
  Filled 2013-05-17: qty 2

## 2013-05-17 MED ORDER — MIDAZOLAM HCL 2 MG/2ML IJ SOLN
INTRAMUSCULAR | Status: DC | PRN
  Administered 2013-05-17: 2 mg via INTRAVENOUS

## 2013-05-17 MED ORDER — ONDANSETRON HCL 4 MG/2ML IJ SOLN
INTRAMUSCULAR | Status: AC
  Filled 2013-05-17: qty 2

## 2013-05-17 MED ORDER — ONDANSETRON HCL 4 MG/2ML IJ SOLN
INTRAMUSCULAR | Status: DC | PRN
  Administered 2013-05-17: 4 mg via INTRAVENOUS

## 2013-05-17 MED ORDER — FENTANYL CITRATE 0.05 MG/ML IJ SOLN: 25.0000 ug | INTRAMUSCULAR | Status: DC | PRN

## 2013-05-17 MED ORDER — FENTANYL CITRATE 0.05 MG/ML IJ SOLN
INTRAMUSCULAR | Status: DC | PRN
  Administered 2013-05-17 (×2): 50 ug via INTRAVENOUS

## 2013-05-17 MED ORDER — LACTATED RINGERS IV SOLN
INTRAVENOUS | Status: DC
  Administered 2013-05-17 (×2): via INTRAVENOUS

## 2013-05-17 MED ORDER — KETOROLAC TROMETHAMINE 30 MG/ML IJ SOLN: 15.0000 mg | Freq: Once | INTRAMUSCULAR | Status: DC | PRN

## 2013-05-17 MED ORDER — LIDOCAINE HCL 1 % IJ SOLN
INTRAMUSCULAR | Status: DC | PRN
  Administered 2013-05-17: 20 mL

## 2013-05-17 MED ORDER — LIDOCAINE HCL (CARDIAC) 20 MG/ML IV SOLN
INTRAVENOUS | Status: AC
  Filled 2013-05-17: qty 5

## 2013-05-17 MED ORDER — LACTATED RINGERS IV SOLN: INTRAVENOUS | Status: DC

## 2013-05-17 MED ORDER — LIDOCAINE HCL (CARDIAC) 20 MG/ML IV SOLN
INTRAVENOUS | Status: DC | PRN
  Administered 2013-05-17: 80 mg via INTRAVENOUS

## 2013-05-17 MED ORDER — SCOPOLAMINE 1 MG/3DAYS TD PT72
MEDICATED_PATCH | TRANSDERMAL | Status: AC
  Filled 2013-05-17: qty 1

## 2013-05-17 MED ORDER — DEXAMETHASONE SODIUM PHOSPHATE 10 MG/ML IJ SOLN
INTRAMUSCULAR | Status: DC | PRN
  Administered 2013-05-17: 10 mg via INTRAVENOUS

## 2013-05-17 SURGICAL SUPPLY — 14 items
CATH ROBINSON RED A/P 16FR (CATHETERS) ×2 IMPLANT
CLOTH BEACON ORANGE TIMEOUT ST (SAFETY) ×2 IMPLANT
CONTAINER PREFILL 10% NBF 60ML (FORM) ×4 IMPLANT
GLOVE BIOGEL PI IND STRL 7.0 (GLOVE) ×1 IMPLANT
GLOVE BIOGEL PI INDICATOR 7.0 (GLOVE) ×1
GLOVE ECLIPSE 7.0 STRL STRAW (GLOVE) ×2 IMPLANT
GOWN STRL REUS W/TWL LRG LVL3 (GOWN DISPOSABLE) ×4 IMPLANT
NDL SPNL 22GX3.5 QUINCKE BK (NEEDLE) ×1 IMPLANT
NEEDLE SPNL 22GX3.5 QUINCKE BK (NEEDLE) ×2 IMPLANT
PACK VAGINAL MINOR WOMEN LF (CUSTOM PROCEDURE TRAY) ×2 IMPLANT
PAD PREP 24X48 CUFFED NSTRL (MISCELLANEOUS) ×2 IMPLANT
SYR CONTROL 10ML LL (SYRINGE) ×2 IMPLANT
TOWEL OR 17X24 6PK STRL BLUE (TOWEL DISPOSABLE) ×4 IMPLANT
WATER STERILE IRR 1000ML POUR (IV SOLUTION) ×2 IMPLANT

## 2013-05-17 NOTE — Interval H&P Note (Signed)
History and Physical Interval Note:  05/17/2013 3:29 PM  Carolyn Clements  has presented today for surgery, with the diagnosis of cpt 58301 - Removal of trapped IUD and replacement  The various methods of treatment have been discussed with the patient and family. After consideration of risks, benefits and other options for treatment, the patient has consented to  Procedure(s) with comments: INTRAUTERINE DEVICE (IUD) REMOVAL (N/A) - Replacement of IUD as a surgical intervention .  The patient's history has been reviewed, patient examined, no change in status, stable for surgery.  I have reviewed the patient's chart and labs.  Questions were answered to the patient's satisfaction.     Reva Boresanya S Chrissie Dacquisto

## 2013-05-17 NOTE — Op Note (Signed)
PROCEDURE DATE: 05/17/2013  PREOPERATIVE DIAGNOSIS: Retained IUD  POSTOPERATIVE DIAGNOSIS: The same  PROCEDURE:     Dilation and IUD removal and Mirena re-insertion  SURGEON:  PRATT,TANYA S  INDICATIONS: Retained IUD, unable to remove in the office.  Risks of surgery were discussed with the patient including but not limited to: bleeding which may require transfusion; infection which may require antibiotics; injury to uterus or surrounding organs.  Likelihood of obtaining sample is high.  FINDINGS:  A 8 wk size uterus, nml appearing cervix.  ANESTHESIA:    Monitored intravenous sedation, paracervical block-1 % lidocaine  ESTIMATED BLOOD LOSS:  Less than 20 ml.  SPECIMENS:  ECC and Endometrial curettings to pathology  COMPLICATIONS:  None immediate.  PROCEDURE DETAILS:  The patient received intravenous antibiotics while in the preoperative area.  She was then taken to the operating room where general anesthesia was administered and was found to be adequate.  After an adequate timeout was performed, she was placed in the dorsal lithotomy position and examined; then prepped and draped in the sterile manner.   Her bladder was catheterized for an unmeasured amount of clear, yellow urine. A vaginal speculum was then placed in the patient's vagina and a single tooth tenaculum was applied to the anterior lip of the cervix.  A paracervical block using 1% Lidocaine with Epi was administered. The uterus sounded to 7.5 cm. The cervix was gently dilated to accommodate a polyp forcep that was gently advanced to the uterine fundus.  The Mirena was grasped and removed easily. Another Mirena placed according to manufacturer's recommendations. Strings trimmed to 3 cm.There was minimal bleeding noted and the tenaculum removed with good hemostasis noted.  The patient tolerated the procedure well.  The patient was taken to the recovery area in stable condition.  Shelbie Proctoranya S PrattMD 05/17/2013 3:53 PM

## 2013-05-17 NOTE — Anesthesia Preprocedure Evaluation (Signed)
Anesthesia Evaluation  Patient identified by MRN, date of birth, ID band Patient awake    Reviewed: Allergy & Precautions, H&P , NPO status , Patient's Chart, lab work & pertinent test results, reviewed documented beta blocker date and time   History of Anesthesia Complications (+) PONV and history of anesthetic complications  Airway Mallampati: II TM Distance: >3 FB Neck ROM: full   Comment: Recent sinus surgery ( 1 week ago) - currently on steroid dose pack Dental  (+) Teeth Intact   Pulmonary neg pulmonary ROS,  breath sounds clear to auscultation  Pulmonary exam normal       Cardiovascular + Valvular Problems/Murmurs MVP Rhythm:regular Rate:Normal     Neuro/Psych Anxiety    GI/Hepatic Neg liver ROS, GERD-  Medicated,  Endo/Other  negative endocrine ROS  Renal/GU      Musculoskeletal   Abdominal   Peds  Hematology negative hematology ROS (+)   Anesthesia Other Findings Doesn't take narcotics because all narcotics make her itch (hasnt taken any since sinus surgery 1 week ago).   Reproductive/Obstetrics negative OB ROS                           Anesthesia Physical Anesthesia Plan  ASA: II  Anesthesia Plan: MAC   Post-op Pain Management:    Induction:   Airway Management Planned:   Additional Equipment:   Intra-op Plan:   Post-operative Plan:   Informed Consent: I have reviewed the patients History and Physical, chart, labs and discussed the procedure including the risks, benefits and alternatives for the proposed anesthesia with the patient or authorized representative who has indicated his/her understanding and acceptance.   Dental Advisory Given  Plan Discussed with: CRNA and Surgeon  Anesthesia Plan Comments:         Anesthesia Quick Evaluation

## 2013-05-17 NOTE — Discharge Instructions (Signed)
DISCHARGE INSTRUCTIONS: IUD insertion The following instructions have been prepared to help you care for yourself upon your return home.  You may use over-the-counter Ibuprofen if needed for pain. May start after 9:53 pm as needed for cramps   Personal hygiene:  Use sanitary pads for vaginal drainage, not tampons.  Shower the day after your procedure.  NO tub baths, pools or Jacuzzis for 2-3 weeks.  Wipe front to back after using the bathroom.  Activity and limitations:  Do NOT drive or operate any equipment for 24 hours. The effects of anesthesia are still present and drowsiness may result.  Do NOT rest in bed all day.  Walking is encouraged.  Walk up and down stairs slowly.  You may resume your normal activity in one to two days or as indicated by your physician.  Sexual activity: NO intercourse for at least 2 weeks after the procedure, or as indicated by your physician.  Diet: Eat a light meal as desired this evening. You may resume your usual diet tomorrow.  Return to work: You may resume your work activities in one to two days or as indicated by your doctor.  What to expect after your surgery: Expect to have vaginal bleeding/discharge for 2-3 days and spotting for up to 10 days. It is not unusual to have soreness for up to 1-2 weeks. You may have a slight burning sensation when you urinate for the first day. Mild cramps may continue for a couple of days. You may have a regular period in 2-6 weeks.  Call your doctor for any of the following:  Excessive vaginal bleeding, saturating and changing one pad every hour.  Inability to urinate 6 hours after discharge from hospital.  Pain not relieved by pain medication.  Fever of 100.4 F or greater.  Unusual vaginal discharge or odor.      Dilation and curettage (D&C) and vacuum curettage are minor procedures. A D&C involves stretching (dilation) the cervix and scraping (curettage) the inside lining of the womb  (uterus). During a D&C, tissue is gently scraped from the inside lining of the uterus. During a vacuum curettage, the lining and tissue in the uterus are removed with the use of gentle suction.  Curettage may be performed to either diagnose or treat a problem. As a diagnostic procedure, curettage is performed to examine tissues from the uterus. A diagnostic curettage may be performed for the following symptoms:   Irregular bleeding in the uterus.   Bleeding with the development of clots.   Spotting between menstrual periods.   Prolonged menstrual periods.   Bleeding after menopause.   No menstrual period (amenorrhea).   A change in size and shape of the uterus.  As a treatment procedure, curettage may be performed for the following reasons:   Removal of an IUD (intrauterine device).   Removal of retained placenta after giving birth. Retained placenta can cause an infection or bleeding severe enough to require transfusions.   Abortion.   Miscarriage.   Removal of polyps inside the uterus.   Removal of uncommon types of noncancerous lumps (fibroids).  LET [facility]OUR HEALTH CARE PROVIDER KNOW ABOUT:   Any allergies you have.   All medicines you are taking, including vitamins, herbs, eye drops, creams, and over-the-counter medicines.   Previous problems you or members of your family have had with the use of anesthetics.   Any blood disorders you have.   Previous surgeries you have had.   Medical conditions you have. RISKS AND COMPLICATIONS  Generally, this is a safe procedure. However, as with any procedure, complications can occur. Possible complications include:  Excessive bleeding.   Infection of the uterus.   Damage to the cervix.   Development of scar tissue (adhesions) inside the uterus, later causing abnormal amounts of menstrual bleeding.   Complications from the general anesthetic, if a general anesthetic is used.   Putting a hole  (perforation) in the uterus. This is rare.  BEFORE THE PROCEDURE   Eat and drink before the procedure only as directed by your health care provider.   Arrange for someone to take you home.  PROCEDURE  This procedure usually takes about 15 30 minutes.  You will be given one of the following:  A medicine that numbs the area in and around the cervix (local anesthetic).   A medicine to make you sleep through the procedure (general anesthetic).  You will lie on your back with your legs in stirrups.   A warm metal or plastic instrument (speculum) will be placed in your vagina to keep it open and to allow the health care provider to see the cervix.  There are two ways in which your cervix can be softened and dilated. These include:   Taking a medicine.   Having thin rods (laminaria) inserted into your cervix.   A curved tool (curette) will be used to scrape cells from the inside lining of the uterus. In some cases, gentle suction is applied with the curette. The curette will then be removed.  AFTER THE PROCEDURE    You will rest in the recovery area until you are stable and are ready to go home.   You may feel sick to your stomach (nauseous) or throw up (vomit) if you were given a general anesthetic.   You may have a sore throat if a tube was placed in your throat during general anesthesia.   You may have light cramping and bleeding. This may last for 2 days to 2 weeks after the procedure.   Your uterus needs to make a new lining after the procedure. This may make your next period late. Document Released: 12/22/2004 Document Revised: 08/24/2012 Document Reviewed: 07/21/2012 [facility] Patient Information 2014 [place], [place].  Levonorgestrel intrauterine device (IUD) What is this medicine? LEVONORGESTREL IUD (LEE voe nor jes trel) is a contraceptive (birth control) device. The device is placed inside the uterus by a healthcare professional. It is used to prevent  pregnancy and can also be used to treat heavy bleeding that occurs during your period. Depending on the device, it can be used for 3 to 5 years. This medicine may be used for other purposes; ask your health care provider or pharmacist if you have questions. COMMON BRAND NAME(S): Gretta CoolMirena, Skyla What should I tell my health care provider before I take this medicine? They need to know if you have any of these conditions: -abnormal Pap smear -cancer of the breast, uterus, or cervix -diabetes -endometritis -genital or pelvic infection now or in the past -have more than one sexual partner or your partner has more than one partner -heart disease -history of an ectopic or tubal pregnancy -immune system problems -IUD in place -liver disease or tumor -problems with blood clots or take blood-thinners -use intravenous drugs -uterus of unusual shape -vaginal bleeding that has not been explained -an unusual or allergic reaction to levonorgestrel, other hormones, silicone, or polyethylene, medicines, foods, dyes, or preservatives -pregnant or trying to get pregnant -breast-feeding How should I use this medicine? This  device is placed inside the uterus by a health care professional. Talk to your pediatrician regarding the use of this medicine in children. Special care may be needed. Overdosage: If you think you have taken too much of this medicine contact a poison control center or emergency room at once. NOTE: This medicine is only for you. Do not share this medicine with others. What if I miss a dose? This does not apply. What may interact with this medicine? Do not take this medicine with any of the following medications: -amprenavir -bosentan -fosamprenavir This medicine may also interact with the following medications: -aprepitant -barbiturate medicines for inducing sleep or treating seizures -bexarotene -griseofulvin -medicines to treat seizures like carbamazepine, ethotoin, felbamate,  oxcarbazepine, phenytoin, topiramate -modafinil -pioglitazone -rifabutin -rifampin -rifapentine -some medicines to treat HIV infection like atazanavir, indinavir, lopinavir, nelfinavir, tipranavir, ritonavir -St. John's wort -warfarin This list may not describe all possible interactions. Give your health care provider a list of all the medicines, herbs, non-prescription drugs, or dietary supplements you use. Also tell them if you smoke, drink alcohol, or use illegal drugs. Some items may interact with your medicine. What should I watch for while using this medicine? Visit your doctor or health care professional for regular check ups. See your doctor if you or your partner has sexual contact with others, becomes HIV positive, or gets a sexual transmitted disease. This product does not protect you against HIV infection (AIDS) or other sexually transmitted diseases. You can check the placement of the IUD yourself by reaching up to the top of your vagina with clean fingers to feel the threads. Do not pull on the threads. It is a good habit to check placement after each menstrual period. Call your doctor right away if you feel more of the IUD than just the threads or if you cannot feel the threads at all. The IUD may come out by itself. You may become pregnant if the device comes out. If you notice that the IUD has come out use a backup birth control method like condoms and call your health care provider. Using tampons will not change the position of the IUD and are okay to use during your period. What side effects may I notice from receiving this medicine? Side effects that you should report to your doctor or health care professional as soon as possible: -allergic reactions like skin rash, itching or hives, swelling of the face, lips, or tongue -fever, flu-like symptoms -genital sores -high blood pressure -no menstrual period for 6 weeks during use -pain, swelling, warmth in the leg -pelvic pain  or tenderness -severe or sudden headache -signs of pregnancy -stomach cramping -sudden shortness of breath -trouble with balance, talking, or walking -unusual vaginal bleeding, discharge -yellowing of the eyes or skin Side effects that usually do not require medical attention (report to your doctor or health care professional if they continue or are bothersome): -acne -breast pain -change in sex drive or performance -changes in weight -cramping, dizziness, or faintness while the device is being inserted -headache -irregular menstrual bleeding within first 3 to 6 months of use -nausea This list may not describe all possible side effects. Call your doctor for medical advice about side effects. You may report side effects to FDA at 1-800-FDA-1088. Where should I keep my medicine? This does not apply. NOTE: This sheet is a summary. It may not cover all possible information. If you have questions about this medicine, talk to your doctor, pharmacist, or health care provider.  2014,  [employer]vier/Gold Standard. (2011-01-22 13:54:04)

## 2013-05-17 NOTE — Transfer of Care (Signed)
Immediate Anesthesia Transfer of Care Note  Patient: Carolyn Clements  Procedure(s) Performed: Procedure(s) with comments: INTRAUTERINE DEVICE (IUD) REMOVAL (N/A) - Replacement of IUD  Patient Location: PACU  Anesthesia Type:MAC  Level of Consciousness: awake, alert  and oriented  Airway & Oxygen Therapy: Patient Spontanous Breathing and Patient connected to nasal cannula oxygen  Post-op Assessment: Report given to PACU RN, Post -op Vital signs reviewed and stable and Patient moving all extremities  Post vital signs: Reviewed and stable  Complications: No apparent anesthesia complications

## 2013-05-18 ENCOUNTER — Encounter (HOSPITAL_COMMUNITY): Payer: Self-pay | Admitting: Family Medicine

## 2013-05-19 NOTE — Anesthesia Postprocedure Evaluation (Signed)
  Anesthesia Post-op Note  Patient: Carolyn Clements  Procedure(s) Performed: Procedure(s) with comments: INTRAUTERINE DEVICE (IUD) REMOVAL (N/A) - Replacement of IUD Patient is awake and responsive. Pain and nausea are reasonably well controlled. Vital signs are stable and clinically acceptable. Oxygen saturation is clinically acceptable. There are no apparent anesthetic complications at this time. Patient is ready for discharge.

## 2013-05-31 ENCOUNTER — Encounter: Payer: Self-pay | Admitting: Obstetrics & Gynecology

## 2013-05-31 ENCOUNTER — Ambulatory Visit (INDEPENDENT_AMBULATORY_CARE_PROVIDER_SITE_OTHER): Payer: BC Managed Care – PPO | Admitting: Obstetrics & Gynecology

## 2013-05-31 ENCOUNTER — Encounter: Payer: BC Managed Care – PPO | Admitting: Obstetrics & Gynecology

## 2013-05-31 VITALS — BP 106/58 | HR 58 | Ht 63.0 in | Wt 128.0 lb

## 2013-05-31 DIAGNOSIS — Z30431 Encounter for routine checking of intrauterine contraceptive device: Secondary | ICD-10-CM

## 2013-05-31 NOTE — Patient Instructions (Signed)
Return to clinic for any scheduled appointments or for any gynecologic concerns as needed.   

## 2013-05-31 NOTE — Progress Notes (Signed)
   CLINIC ENCOUNTER NOTE  History:  43 y.o.  [id number] here today for IUD check, postoperative check after removal of retained IUD and replacement in OR about 3 weeks ago. She can feel strings. No concerning side effects.  The following portions of the patient's history were reviewed and updated as appropriate: allergies, current medications, past family history, past medical history, past social history, past surgical history and problem list. Normal pap and negative HRHPV in 2012.  Review of Systems:  Pertinent items are noted in HPI.  Objective:  Physical Exam BP 106/58  Pulse 58  Ht 5\' 3"  (1.6 m)  Wt 128 lb (58.06 kg)  BMI 22.68 kg/m2 Gen: NAD Abd: Soft, nontender and nondistended Pelvic: Normal appearing external genitalia; normal appearing vaginal mucosa and cervix.  Normal discharge.   IUD strings visualized; 3 cm in length from os.  Assessment & Plan:  Normal IUD check, patient reassured Will return for pap smear, and have mammogram scheduled at a later visit Routine preventative health maintenance measures emphasized.   Jaynie Collins, MD, FACOG Attending Obstetrician & Gynecologist Faculty Practice, [facility]

## 2013-06-12 NOTE — Addendum Note (Signed)
Addendum created 06/12/13 1404 by Cristela Blue, MD   Modules edited: Notes Section   Notes Section:  File: [id number]

## 2013-06-12 NOTE — Anesthesia Postprocedure Evaluation (Signed)
  Anesthesia Post-op Note  Patient: Carolyn Clements  Procedure(s) Performed: Procedure(s) with comments: INTRAUTERINE DEVICE (IUD) REMOVAL (N/A) - Replacement of IUD Last Vitals:  Filed Vitals:   05/17/13 1736  BP: 111/68  Pulse: 46  Temp: 36.7 C  Resp: 16    Patient is awake and responsive. Pain and nausea are reasonably well controlled. Vital signs are stable and clinically acceptable. Oxygen saturation is clinically acceptable. There are no apparent anesthetic complications at this time. Patient is ready for discharge.

## 2013-07-06 ENCOUNTER — Ambulatory Visit: Payer: Self-pay | Admitting: Emergency Medicine

## 2013-10-17 LAB — LIPID PANEL
Cholesterol: 179 mg/dL (ref 0–200)
HDL: 77 mg/dL — AB (ref 35–70)
LDL Cholesterol: 93 mg/dL
Triglycerides: 46 mg/dL (ref 40–160)

## 2013-10-17 LAB — HEMOGLOBIN A1C: HEMOGLOBIN A1C: 5.3 % (ref 4.0–6.0)

## 2013-10-17 LAB — BASIC METABOLIC PANEL: Glucose: 99 mg/dL

## 2013-10-31 ENCOUNTER — Telehealth: Payer: Self-pay | Admitting: Internal Medicine

## 2013-10-31 NOTE — Telephone Encounter (Signed)
Pt called to make an appt for fatigue. The next available appt is in December. Please advise where to add pt to the schedule.msn

## 2013-10-31 NOTE — Telephone Encounter (Signed)
November 9 acute.

## 2013-11-03 ENCOUNTER — Telehealth: Payer: Self-pay | Admitting: Internal Medicine

## 2013-11-03 MED ORDER — CIPROFLOXACIN HCL 250 MG PO TABS: 250.0000 mg | ORAL_TABLET | Freq: Two times a day (BID) | ORAL | Status: DC

## 2013-11-03 NOTE — Telephone Encounter (Signed)
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 Patient has an appointment Monday @ 6.30 PM.

## 2013-11-03 NOTE — Telephone Encounter (Signed)
Left detailed message for patient that medication called to [facility] [place] and cancelled Monday appointment.

## 2013-11-03 NOTE — Telephone Encounter (Signed)
Not needed.  i [facility] [place].  Cancel Monday's appt,  If symptoms still present we'll work her in for ua and culture

## 2013-11-03 NOTE — Telephone Encounter (Signed)
[name] called saying she thinks she has a UTI. She's had the following symptoms x's 4days: discomfort in urination, different smell or urine, aching. She's wondering if she can be seen today instead of Monday or if something can be called in for her. I offered her an appt [facility] at 10am and at 1:15pm but she said she wouldn't be able to make those appts. Please call the pt. Pt ph# [phone] Thank you.

## 2013-11-06 ENCOUNTER — Encounter: Payer: Self-pay | Admitting: Obstetrics & Gynecology

## 2013-11-06 ENCOUNTER — Ambulatory Visit (INDEPENDENT_AMBULATORY_CARE_PROVIDER_SITE_OTHER): Payer: BC Managed Care – PPO | Admitting: Internal Medicine

## 2013-11-06 VITALS — BP 104/74 | HR 61 | Temp 98.5°F | Resp 14 | Ht 63.0 in | Wt 134.2 lb

## 2013-11-06 DIAGNOSIS — E559 Vitamin D deficiency, unspecified: Secondary | ICD-10-CM

## 2013-11-06 DIAGNOSIS — D509 Iron deficiency anemia, unspecified: Secondary | ICD-10-CM

## 2013-11-06 DIAGNOSIS — Z1159 Encounter for screening for other viral diseases: Secondary | ICD-10-CM

## 2013-11-06 DIAGNOSIS — R5383 Other fatigue: Secondary | ICD-10-CM

## 2013-11-06 NOTE — Progress Notes (Signed)
Patient ID: Carolyn Clements, female   DOB: 07-11-1970, 43 y.o.   MRN: [id number]   Patient Active Problem List   Diagnosis Date Noted  . Fatigue 11/07/2013  . Anemia, iron deficiency 11/22/2012  . UTI (urinary tract infection) 11/22/2012  . Generalized anxiety disorder 01/07/2012  . GERD (gastroesophageal reflux disease)   . Migraine headache   . Attempted IUD removal, unsuccessful   . Screening for cervical cancer     Subjective:  CC:   Chief Complaint  Patient presents with  . Fatigue    last 4 to 5 weeks.    HPI:   Carolyn Clements is a 43 y.o. female who presents for evaluation of fatigue.   FATIGUE. Symptoms present for about 6 weeks.  Wakes up tired,  Having trouble staying awake during the day.  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  Personal exercise routine has been disrupted lately, which has gotten her out of sorts.  Has history of iron deficiency,  Chronic  Not due to menstruation (patient has IUD and has not had menses in years) and is being treated for GAD with lamictal (for over a year) and Ability recently added (3 weeks ago)  Does not snore.   In bed by 10:30 or 11:00,  Uses lap top within 2 hours of bedtime.    Past Medical History  Diagnosis Date  . Pyloric stenosis infant    Inguinal Hernia repair  . Anxiety   . GERD (gastroesophageal reflux disease)   . Migraine headache   . [facility]  Dr Shawnie PonsPratt  . Screening for cervical cancer April 2012    normal PAP   . PONV (postoperative nausea and vomiting)   . MVP (mitral valve prolapse)     Past Surgical History  Procedure Laterality Date  . Arthroscopic repair acl      torn/repaired ACL, followed by patellar fractuer, [employer]  . Cesarean section      2003 and 2004  . Hernia repair  1976  . Pyloroplasty  1974  . Iud removal N/A 05/17/2013    Procedure: INTRAUTERINE DEVICE (IUD) REMOVAL;  Surgeon: Reva Boresanya S Pratt, MD;  Location: [facility];  Service: Gynecology;   Laterality: N/A;  Replacement of IUD       The following portions of the patient's history were reviewed and updated as appropriate: Allergies, current medications, and problem list.    Review of Systems:   Patient denies headache, fevers, malaise, unintentional weight loss, skin rash, eye pain, sinus congestion and sinus pain, sore throat, dysphagia,  hemoptysis , cough, dyspnea, wheezing, chest pain, palpitations, orthopnea, edema, abdominal pain, nausea, melena, diarrhea, constipation, flank pain, dysuria, hematuria, urinary  Frequency, nocturia, numbness, tingling, seizures,  Focal weakness, Loss of consciousness,  Tremor, insomnia, depression, anxiety, and suicidal ideation.     History   Social History  . Marital Status: Married    Spouse Name: N/A    Number of Children: N/A  . Years of Education: N/A   Occupational History  . physical therapist     Physical Therapist   Social History Main Topics  . Smoking status: Never Smoker   . Smokeless tobacco: Never Used  . Alcohol Use: 1.8 oz/week    3 Shots of liquor per week  . Drug Use: No  . Sexual Activity: Yes    Birth Control/ Protection: IUD   Other Topics Concern  . Not on file  Social History Narrative    Objective:  Filed Vitals:   11/06/13 1855  BP: 104/74  Pulse: 61  Temp: 98.5 F (36.9 C)  Resp: 14     General appearance: alert, cooperative and appears stated age Ears: normal TM's and external ear canals both ears Throat: lips, mucosa, and tongue normal; teeth and gums normal Neck: no adenopathy, no carotid bruit, supple, symmetrical, trachea midline and thyroid not enlarged, symmetric, no tenderness/mass/nodules Back: symmetric, no curvature. ROM normal. No CVA tenderness. Lungs: clear to auscultation bilaterally Heart: regular rate and rhythm, S1, S2 normal, no murmur, click, rub or gallop Abdomen: soft, non-tender; bowel sounds normal; no masses,  no organomegaly Pulses: 2+ and  symmetric Skin: Skin color, texture, turgor normal. No rashes or lesions Lymph nodes: Cervical, supraclavicular, and axillary nodes normal.  Assessment and Plan:  Fatigue Etiology unclear.  Will rule out anemia, thyroid disorder ,  Advised to consider sleep study if labs rule out deficiencies,  But she has deferred additional workup at this time.    Updated Medication List Outpatient Encounter Prescriptions as of 11/06/2013  Medication Sig  . ARIPiprazole (ABILIFY) 5 MG tablet Take 5 mg by mouth daily.  . ciprofloxacin (CIPRO) 250 MG tablet Take 1 tablet (250 mg total) by mouth 2 (two) times daily.  . fexofenadine (ALLEGRA) 180 MG tablet Take 180 mg by mouth daily.   Marland Kitchen. FINACEA 15 % cream Apply 1 application topically 2 (two) times daily.  Marland Kitchen. ketotifen (ZADITOR) 0.025 % ophthalmic solution Place 1 drop into both eyes 2 (two) times daily.    Marland Kitchen. lamoTRIgine (LAMICTAL) 150 MG tablet Take 150 mg by mouth daily.  . NON FORMULARY Inject 2 each into the muscle once a week. Allergy shots from allergist  . omeprazole (PRILOSEC) 20 MG capsule TAKE ONE CAPSULE BY MOUTH TWICE A DAY  . Probiotic Product (PROBIOTIC FORMULA PO) Take 1 capsule by mouth 2 (two) times daily.   . Pseudoephedrine HCl (SUDAFED 12 HOUR PO) Take 1 tablet by mouth daily as needed (sinus infections).  . traZODone (DESYREL) 150 MG tablet Take 150 mg by mouth at bedtime.  . triamcinolone (NASACORT ALLERGY 24HR) 55 MCG/ACT AERO nasal inhaler Place 2 sprays into the nose daily.  . Guaifenesin 1200 MG TB12 Take 1 tablet by mouth 2 (two) times daily as needed (sinus infections).  Marland Kitchen. levonorgestrel (MIRENA) 20 MCG/24HR IUD 1 each by Intrauterine route once.     Orders Placed This Encounter  Procedures  . CBC with Differential  . TSH  . Comprehensive metabolic panel  . Vit D  25 hydroxy (rtn osteoporosis monitoring)  . Hepatitis C antibody  . IBC panel  . Ferritin    No Follow-up on file.

## 2013-11-06 NOTE — Progress Notes (Signed)
Pre-visit discussion using our clinic review tool. No additional management support is needed unless otherwise documented below in the visit note.  

## 2013-11-07 ENCOUNTER — Other Ambulatory Visit (INDEPENDENT_AMBULATORY_CARE_PROVIDER_SITE_OTHER): Payer: BC Managed Care – PPO

## 2013-11-07 DIAGNOSIS — D509 Iron deficiency anemia, unspecified: Secondary | ICD-10-CM | POA: Diagnosis not present

## 2013-11-07 DIAGNOSIS — E559 Vitamin D deficiency, unspecified: Secondary | ICD-10-CM | POA: Diagnosis not present

## 2013-11-07 DIAGNOSIS — R5383 Other fatigue: Secondary | ICD-10-CM | POA: Insufficient documentation

## 2013-11-07 DIAGNOSIS — Z1159 Encounter for screening for other viral diseases: Secondary | ICD-10-CM

## 2013-11-07 LAB — COMPREHENSIVE METABOLIC PANEL
ALBUMIN: 3.5 g/dL (ref 3.5–5.2)
ALK PHOS: 60 U/L (ref 39–117)
ALT: 13 U/L (ref 0–35)
AST: 20 U/L (ref 0–37)
BILIRUBIN TOTAL: 0.6 mg/dL (ref 0.2–1.2)
BUN: 19 mg/dL (ref 6–23)
CO2: 29 mEq/L (ref 19–32)
Calcium: 9.1 mg/dL (ref 8.4–10.5)
Chloride: 100 mEq/L (ref 96–112)
Creatinine, Ser: 0.9 mg/dL (ref 0.4–1.2)
GFR: 77.27 mL/min (ref >60.00)
Glucose, Bld: 86 mg/dL (ref 70–99)
Potassium: 4.2 mEq/L (ref 3.5–5.1)
SODIUM: 137 meq/L (ref 135–145)
TOTAL PROTEIN: 6.5 g/dL (ref 6.0–8.3)

## 2013-11-07 LAB — CBC WITH DIFFERENTIAL/PLATELET
Basophils Absolute: 0 10*3/uL (ref 0.0–0.1)
Basophils Relative: 0.4 % (ref 0.0–3.0)
EOS ABS: 0 10*3/uL (ref 0.0–0.7)
Eosinophils Relative: 0.1 % (ref 0.0–5.0)
HCT: 37.3 % (ref 36.0–46.0)
Hemoglobin: 12.3 g/dL (ref 12.0–15.0)
Lymphocytes Relative: 28.4 % (ref 12.0–46.0)
Lymphs Abs: 1.6 10*3/uL (ref 0.7–4.0)
MCHC: 33.1 g/dL (ref 30.0–36.0)
MCV: 92.9 fl (ref 78.0–100.0)
Monocytes Absolute: 0.5 10*3/uL (ref 0.1–1.0)
Monocytes Relative: 8.3 % (ref 3.0–12.0)
NEUTROS PCT: 62.8 % (ref 43.0–77.0)
Neutro Abs: 3.5 10*3/uL (ref 1.4–7.7)
PLATELETS: 209 10*3/uL (ref 150.0–400.0)
RBC: 4.01 Mil/uL (ref 3.87–5.11)
RDW: 13.1 % (ref 11.5–15.5)
WBC: 5.7 10*3/uL (ref 4.0–10.5)

## 2013-11-07 LAB — IBC PANEL
Iron: 72 ug/dL (ref 42–145)
Saturation Ratios: 22.5 % (ref 20.0–50.0)
Transferrin: 228.3 mg/dL (ref 212.0–360.0)

## 2013-11-07 NOTE — Assessment & Plan Note (Signed)
Etiology unclear.  Will rule out anemia, thyroid disorder ,  Advised to consider sleep study if labs rule out deficiencies,  But she has deferred additional workup at this time.

## 2013-11-08 LAB — FERRITIN: Ferritin: 44.5 ng/mL (ref 10.0–291.0)

## 2013-11-08 LAB — VITAMIN D 25 HYDROXY (VIT D DEFICIENCY, FRACTURES): VITD: 33.3 ng/mL (ref 30.00–100.00)

## 2013-11-08 LAB — TSH: TSH: 1.01 u[IU]/mL (ref 0.35–4.50)

## 2013-11-08 LAB — HEPATITIS C ANTIBODY: HCV AB: NEGATIVE

## 2013-11-10 ENCOUNTER — Encounter: Payer: Self-pay | Admitting: Internal Medicine

## 2013-11-23 ENCOUNTER — Other Ambulatory Visit: Payer: Self-pay | Admitting: Internal Medicine

## 2013-11-24 ENCOUNTER — Other Ambulatory Visit: Payer: Self-pay | Admitting: Internal Medicine

## 2013-11-24 DIAGNOSIS — R5383 Other fatigue: Secondary | ICD-10-CM

## 2013-12-12 ENCOUNTER — Ambulatory Visit (INDEPENDENT_AMBULATORY_CARE_PROVIDER_SITE_OTHER): Payer: BC Managed Care – PPO | Admitting: Endocrinology

## 2013-12-12 ENCOUNTER — Encounter: Payer: Self-pay | Admitting: Endocrinology

## 2013-12-12 VITALS — BP 100/62 | HR 60 | Resp 14 | Ht 63.0 in | Wt 134.5 lb

## 2013-12-12 DIAGNOSIS — R5383 Other fatigue: Secondary | ICD-10-CM

## 2013-12-12 NOTE — Progress Notes (Signed)
Reason for visit: Fatigue, ? Adrenal fatigue  HPI  Carolyn Clements is a 43 y.o.-year-old female, referred by her PCP, Duncan DullULLO,TERESA, MD,  for evaluation for fatigue.  The patient reports having fatigue on and off for the past few years, with recent worsening in the past 6 months. Over time, she has attributed her fatigue to her work, family and kids, but recently this has affected her quality of life at work and home. She notices this the first thing in the morning and through the afternoon. Energy levels get better during the evening. She feels that she could take a nap during the day on most days. She feels that sleeps atleast 4 hours straight through the night. Doesn't feel refreshed when she awakens, denies spells of apnea through the night or snoring. No recent weight changes.  No recent steroid use, no skin darkening, denies nausea. Always had lower BPs, denies syncope. Is on chronic daily nasocort. Has occasional LH through the day Denies abdominal pain except when she believes she has some gluten intake. Denies celiac disease, just gluten insensitivity. Denies prior thyroid disease. Denies FH thyroid disease or adrenal problems.  Has been following a gluten free healthy diet and exercises regularly.  No recent medication changes except abilify.  Recent labs were normal for thyroid, CMP, CBC, Vitamin D, glucose and calcium.   I reviewed pt's thyroid tests: Lab Results  Component Value Date   TSH 1.01 11/07/2013   TSH 0.68 11/22/2012   TSH 1.17 03/22/2012   TSH 1.24 11/24/2010    Lab Results  Component Value Date   GLUCOSE 86 11/07/2013   Lab Results  Component Value Date   CALCIUM 9.1 11/07/2013    I reviewed her chart and she also has a history of  Migraines, GAD, iron deficiency anemia .    ROS: Review of Systems: [x]  complains of  [  ] denies General:   [  ] Recent weight change [ x ] Fatigue  [  ] Loss of appetite Eyes: [  ]  Vision Difficulty [  ]  Eye pain ENT: [  ]   Hearing difficulty [  ]  Difficulty Swallowing CVS: [  ] Chest pain [  ]  Palpitations/Irregular Heart beat [  ]  Shortness of breath lying flat [  ] Swelling of legs Resp: [  ] Frequent Cough [  ] Shortness of Breath  [  ]  Wheezing GI: [  ] Heartburn  [  ] Nausea or Vomiting  [  ] Diarrhea [  ] Constipation  [  ] Abdominal Pain GU: [  ]  Polyuria  [  ]  nocturia Bones/joints:  [  ]  Muscle aches  [  ] Joint Pain  [  ] Bone pain Skin/Hair/Nails: [  ]  Rash  [  ] New stretch marks [  ]  Itching [  ] Hair loss [  ]  Excessive hair growth Reproduction: [  ] Low sexual desire , [  ]  Women: Menstrual cycle problems [  ]  Women: Breast Discharge [  ] Men: Difficulty with erections [  ]  Men: Enlarged Breasts CNS: [  ] Frequent Headaches [  ] Blurry vision [ x ] Tremors [  ] Seizures [  ] Loss of consciousness [  ] Localized weakness Endocrine: [  ]  Excess thirst [  ]  Feeling excessively hot [  ]  Feeling excessively cold Heme: [  ]  Easy bruising [  ]  Enlarged glands or lumps in neck Allergy: [  ]  Food allergies [ x ] Environmental allergies  PE: BP 100/62 mmHg  Pulse 60  Resp 14  Ht 5\' 3"  (1.6 m)  Wt 134 lb 8 oz (61.009 kg)  BMI 23.83 kg/m2  SpO2 98% Wt Readings from Last 3 Encounters:  12/12/13 134 lb 8 oz (61.009 kg)  11/06/13 134 lb 4 oz (60.895 kg)  05/31/13 128 lb (58.06 kg)    HEENT: [place]/AT, EOMI, no icterus, no proptosis, no chemosis, no mild lid lag, no retraction, eyes close completely Neck: thyroid gland - smooth, non-tender, no erythema, no tracheal deviation; negative Pemberton's sign; no lymphadenopathy; no bruits Lungs: good air entry, clear bilaterally Heart: S1&S2 normal, regular rate & rhythm; no murmurs, rubs or gallops Abd: soft, NT, ND, no HSM, +BS Ext: occ tremor in hands bilaterally, no edema, 2+ DP/PT pulses, good muscle mass, no skin darkening Neuro: normal gait, 2+ reflexes bilaterally, normal 5/5 strength, no proximal myopathy  Derm: no pretibial  myxoedema/skin dryness   ASSESSMENT: 1. Fatigue  PLAN:  Problem List Items Addressed This Visit      Other   Fatigue - Primary    I reviewed with her the endocrine causes of fatigue. Recent lab screen was normal and hence thyroid disorders, hypercalcemia, DM, hyponatremia are all less likely.  For completeness , would be reasonable to rule out adrenal insufficiency as cause. She is on inhaled steroid, which could affect the baseline morning cortisol.  Discussed symptoms of adrenal insufficiency and that she has low likelihood of this disorder.  Would ask her to return for morning cortisol, ACTH level this week. If that is abnormal, then will proceed with ACTH stimulation testing.   Suspect that her fatigue may be related to sleep hygiene and possibly sleep apnea, for which I encouraged  her to get tested through her PCP.  Also, it is possible that this could be a side effect of her medications, for which she is going to check with neuropsychologist.   RTC prn.    Relevant Orders      Cortisol      ACTH     45 minutes spent with the patient, > 50% time spent on discussion of topics mentioned above.    Nylan Nakatani [facility]  12/12/2013   4:18 PM

## 2013-12-12 NOTE — Patient Instructions (Signed)
Please return this Friday for nonfasting blood work between 8-9 am.   If this test is low , then will consider ACTH stimulation test.   Return to clinic prn.

## 2013-12-12 NOTE — Progress Notes (Signed)
Pre visit review using our clinic review tool, if applicable. No additional management support is needed unless otherwise documented below in the visit note. 

## 2013-12-13 ENCOUNTER — Encounter: Payer: Self-pay | Admitting: Endocrinology

## 2013-12-13 NOTE — Assessment & Plan Note (Signed)
I reviewed with her the endocrine causes of fatigue. Recent lab screen was normal and hence thyroid disorders, hypercalcemia, DM, hyponatremia are all less likely.  For completeness , would be reasonable to rule out adrenal insufficiency as cause. She is on inhaled steroid, which could affect the baseline morning cortisol.  Discussed symptoms of adrenal insufficiency and that she has low likelihood of this disorder.  Would ask her to return for morning cortisol, ACTH level this week. If that is abnormal, then will proceed with ACTH stimulation testing.   Suspect that her fatigue may be related to sleep hygiene and possibly sleep apnea, for which I encouraged  her to get tested through her PCP.  Also, it is possible that this could be a side effect of her medications, for which she is going to check with neuropsychologist.   RTC prn.

## 2013-12-15 ENCOUNTER — Other Ambulatory Visit (INDEPENDENT_AMBULATORY_CARE_PROVIDER_SITE_OTHER): Payer: BC Managed Care – PPO

## 2013-12-15 DIAGNOSIS — R5383 Other fatigue: Secondary | ICD-10-CM

## 2013-12-15 LAB — CORTISOL: CORTISOL PLASMA: 11.3 ug/dL

## 2013-12-15 NOTE — Addendum Note (Signed)
Addended by: Cristela BluePULLIAM, Pleas Carneal W on: 12/15/2013 08:29 AM   Modules accepted: Orders

## 2013-12-18 LAB — ACTH: C206 ACTH: 14 pg/mL (ref 6–50)

## 2014-01-29 ENCOUNTER — Telehealth: Payer: Self-pay

## 2014-01-29 MED ORDER — FLUCONAZOLE 150 MG PO TABS: 150.0000 mg | ORAL_TABLET | Freq: Every day | ORAL | Status: DC

## 2014-01-29 NOTE — Telephone Encounter (Signed)
The patient called and is hoping to have an rx called in for a yeast infection.

## 2014-01-29 NOTE — Telephone Encounter (Signed)
RX SENT

## 2014-01-29 NOTE — Telephone Encounter (Signed)
Pt.notified

## 2014-01-29 NOTE — Telephone Encounter (Signed)
Patient  Has itching and burnng.

## 2014-03-17 ENCOUNTER — Other Ambulatory Visit: Payer: Self-pay | Admitting: Internal Medicine

## 2014-04-07 ENCOUNTER — Ambulatory Visit
Admit: 2014-04-07 | Disposition: A | Discharge status: Home / Self Care | Payer: Self-pay | Attending: Internal Medicine | Admitting: Internal Medicine

## 2014-04-12 ENCOUNTER — Ambulatory Visit
Admit: 2014-04-12 | Disposition: A | Discharge status: Home / Self Care | Payer: Self-pay | Attending: Family Medicine | Admitting: Family Medicine

## 2014-06-26 ENCOUNTER — Ambulatory Visit (INDEPENDENT_AMBULATORY_CARE_PROVIDER_SITE_OTHER): Payer: BLUE CROSS/BLUE SHIELD | Admitting: Internal Medicine

## 2014-06-26 ENCOUNTER — Ambulatory Visit: Payer: Self-pay | Admitting: Internal Medicine

## 2014-06-26 ENCOUNTER — Encounter: Payer: Self-pay | Admitting: Internal Medicine

## 2014-06-26 VITALS — BP 102/60 | HR 53 | Temp 98.7°F | Wt 128.0 lb

## 2014-06-26 DIAGNOSIS — R55 Syncope and collapse: Secondary | ICD-10-CM | POA: Diagnosis not present

## 2014-06-26 NOTE — Patient Instructions (Signed)
Vasovagal Syncope, Adult °Syncope, commonly known as fainting, is a temporary loss of consciousness. It occurs when the blood flow to the brain is reduced. Vasovagal syncope (also called neurocardiogenic syncope) is a fainting spell in which the blood flow to the brain is reduced because of a sudden drop in heart rate and blood pressure. Vasovagal syncope occurs when the brain and the cardiovascular system (blood vessels) do not adequately communicate and respond to each other. This is the most common cause of fainting. It often occurs in response to fear or some other type of emotional or physical stress. The body has a reaction in which the heart starts beating too slowly or the blood vessels expand, reducing blood pressure. This type of fainting spell is generally considered harmless. However, injuries can occur if a person takes a sudden fall during a fainting spell.  °CAUSES  °Vasovagal syncope occurs when a person's blood pressure and heart rate decrease suddenly, usually in response to a trigger. Many things and situations can trigger an episode. Some of these include:  °· Pain.   °· Fear.   °· The sight of blood or medical procedures, such as blood being drawn from a vein.   °· Common activities, such as coughing, swallowing, stretching, or going to the bathroom.   °· Emotional stress.   °· Prolonged standing, especially in a warm environment.   °· Lack of sleep or rest.   °· Prolonged lack of food.   °· Prolonged lack of fluids.   °· Recent illness. °· The use of certain drugs that affect blood pressure, such as cocaine, alcohol, marijuana, inhalants, and opiates.   °SYMPTOMS  °Before the fainting episode, you may:  °· Feel dizzy or light headed.   °· Become pale. °· Sense that you are going to faint.   °· Feel like the room is spinning.   °· Have tunnel vision, only seeing directly in front of you.   °· Feel sick to your stomach (nauseous).   °· See spots or slowly lose vision.   °· Hear ringing in your  ears.   °· Have a headache.   °· Feel warm and sweaty.   °· Feel a sensation of pins and needles. °During the fainting spell, you will generally be unconscious for no longer than a couple minutes before waking up and returning to normal. If you get up too quickly before your body can recover, you may faint again. Some twitching or jerky movements may occur during the fainting spell.  °DIAGNOSIS  °Your caregiver will ask about your symptoms, take a medical history, and perform a physical exam. Various tests may be done to rule out other causes of fainting. These may include blood tests and tests to check the heart, such as electrocardiography, echocardiography, and possibly an electrophysiology study. When other causes have been ruled out, a test may be done to check the body's response to changes in position (tilt table test). °TREATMENT  °Most cases of vasovagal syncope do not require treatment. Your caregiver may recommend ways to avoid fainting triggers and may provide home strategies for preventing fainting. If you must be exposed to a possible trigger, you can drink additional fluids to help reduce your chances of having an episode of vasovagal syncope. If you have warning signs of an oncoming episode, you can respond by positioning yourself favorably (lying down). °If your fainting spells continue, you may be given medicines to prevent fainting. Some medicines may help make you more resistant to repeated episodes of vasovagal syncope. Special exercises or compression stockings may be recommended. In rare cases, the surgical placement   of a pacemaker is considered. °HOME CARE INSTRUCTIONS  °· Learn to identify the warning signs of vasovagal syncope.   °· Sit or lie down at the first warning sign of a fainting spell. If sitting, put your head down between your legs. If you lie down, swing your legs up in the air to increase blood flow to the brain.   °· Avoid hot tubs and saunas. °· Avoid prolonged  standing. °· Drink enough fluids to keep your urine clear or pale yellow. Avoid caffeine. °· Increase salt in your diet as directed by your caregiver.   °· If you have to stand for a long time, perform movements such as:   °¨ Crossing your legs.   °¨ Flexing and stretching your leg muscles.   °¨ Squatting.   °¨ Moving your legs.   °¨ Bending over.   °· Only take over-the-counter or prescription medicines as directed by your caregiver. Do not suddenly stop any medicines without asking your caregiver first.  °SEEK MEDICAL CARE IF:  °· Your fainting spells continue or happen more frequently in spite of treatment.   °· You lose consciousness for more than a couple minutes. °· You have fainting spells during or after exercising or after being startled.   °· You have new symptoms that occur with the fainting spells, such as:   °¨ Shortness of breath. °¨ Chest pain.   °¨ Irregular heartbeat.   °· You have episodes of twitching or jerky movements that last longer than a few seconds. °· You have episodes of twitching or jerky movements without obvious fainting. °SEEK IMMEDIATE MEDICAL CARE IF:  °· You have injuries or bleeding after a fainting spell.   °· You have episodes of twitching or jerky movements that last longer than 5 minutes.   °· You have more than one spell of twitching or jerky movements before returning to consciousness after fainting. °MAKE SURE YOU:  °· Understand these instructions. °· Will watch your condition. °· Will get help right away if you are not doing well or get worse. °Document Released: 12/09/2011 Document Reviewed: 12/09/2011 °[employer]® Patient Information ©2015 [employer]. This information is not intended to replace advice given to you by your health care provider. Make sure you discuss any questions you have with your health care provider. ° °

## 2014-06-26 NOTE — Progress Notes (Signed)
Subjective:    Patient ID: Carolyn Clements, female    DOB: 29-Aug-1970, 44 y.o.   MRN: [id number]  HPI  Pt presents to the clinic today with c/o dizziness. This has been intermittent, but has been getting worse over the last few months. She has had 2 presyncopal episodes since 04/2014. One episode she fell, hit her head and ended up with stitches on her forehead.  It seems worse first thing in the morning when she gets up out of the bed. She reports it is a sudden onset of the room spinning, sweating, nuasea and headache. It does not last very long. She denies chest pain, chest tightness or shortness of breath. She has noted that her BP and HR runs low at time and is not sure if it is related. She has been wearing a fitbit to keep up with her HR. She has noticed at night that sometimes it gets as low as 43.  Review of Systems      Past Medical History  Diagnosis Date  . Pyloric stenosis infant    Inguinal Hernia repair  . Anxiety   . GERD (gastroesophageal reflux disease)   . Migraine headache   . IUD     [facility]  Dr Kennon Rounds  . Screening for cervical cancer April 2012    normal PAP   . PONV (postoperative nausea and vomiting)   . MVP (mitral valve prolapse)     Current Outpatient Prescriptions  Medication Sig Dispense Refill  . FINACEA 15 % cream Apply 1 application topically 2 (two) times daily.  11  . ketotifen (ZADITOR) 0.025 % ophthalmic solution Place 1 drop into both eyes 2 (two) times daily.      Marland Kitchen lamoTRIgine (LAMICTAL) 150 MG tablet Take 150 mg by mouth daily.    Marland Kitchen levonorgestrel (MIRENA) 20 MCG/24HR IUD 1 each by Intrauterine route once.    . NON FORMULARY Inject 2 each into the muscle once a week. Allergy shots from allergist    . omeprazole (PRILOSEC) 20 MG capsule TAKE ONE CAPSULE BY MOUTH TWICE A DAY 60 capsule 5  . Probiotic Product (PROBIOTIC FORMULA PO) Take 1 capsule by mouth 2 (two) times daily.     . traZODone (DESYREL) 150 MG tablet Take 150  mg by mouth at bedtime.    . triamcinolone (NASACORT ALLERGY 24HR) 55 MCG/ACT AERO nasal inhaler Place 2 sprays into the nose daily.    . fluconazole (DIFLUCAN) 150 MG tablet Take 1 tablet (150 mg total) by mouth daily. (Patient not taking: Reported on 06/26/2014) 2 tablet 0   No current facility-administered medications for this visit.    Allergies  Allergen Reactions  . Codeine Sulfate Itching and Nausea And Vomiting    Says all narcotics cause itching and she tries to avoid  . Sulfa Antibiotics Hives    Hives and fever at age of 30, has not had since then  . Latex Rash    Not severe, but avoids gloves  condoms    Family History  Problem Relation Age of Onset  . Cancer Father     adenocarcinoma, nonsmoker  . Cancer Maternal Grandfather     lung  . BRCA 1/2 Paternal Grandmother   . Cancer Paternal Grandfather     lung  . COPD Mother   . Diabetes Mother   . Hypertension Mother     History   Social History  . Marital Status: Married    Spouse Name: N/A  .  Number of Children: N/A  . Years of Education: N/A   Occupational History  . physical therapist     Physical Therapist   Social History Main Topics  . Smoking status: Never Smoker   . Smokeless tobacco: Never Used  . Alcohol Use: 1.8 oz/week    3 Shots of liquor per week     Comment: occasional  . Drug Use: No  . Sexual Activity: Yes    Birth Control/ Protection: IUD   Other Topics Concern  . Not on file   Social History Narrative     Constitutional: Denies fever, malaise, fatigue, headache or abrupt weight changes.  HEENT: Denies eye pain, eye redness, ear pain, ringing in the ears, wax buildup, runny nose, nasal congestion, bloody nose, or sore throat. Respiratory: Denies difficulty breathing, shortness of breath, cough or sputum production.   Cardiovascular: Denies chest pain, chest tightness, palpitations or swelling in the hands or feet.  Gastrointestinal: Denies abdominal pain, bloating,  constipation, diarrhea or blood in the stool.  GU: Denies urgency, frequency, pain with urination, burning sensation, blood in urine, odor or discharge. Musculoskeletal: Denies decrease in range of motion, difficulty with gait, muscle pain or joint pain and swelling.  Skin: Denies redness, rashes, lesions or ulcercations.  Neurological: Pt reports dizziness. Denies difficulty with memory, difficulty with speech or problems with balance and coordination.   No other specific complaints in a complete review of systems (except as listed in HPI above).  Objective:   Physical Exam  BP 102/60 mmHg  Pulse 53  Temp(Src) 98.7 F (37.1 C) (Oral)  Wt 128 lb (58.06 kg)  SpO2 99% Wt Readings from Last 3 Encounters:  06/26/14 128 lb (58.06 kg)  12/12/13 134 lb 8 oz (61.009 kg)  11/06/13 134 lb 4 oz (60.895 kg)    General: Appears her stated age, well developed, well nourished in NAD. HEENT: Head: normal shape and size; Eyes: sclera white, no icterus, conjunctiva pink, PERRLA and EOMs intact; Ears: Tm's gray and intact, normal light reflex;  Cardiovascular: Normal rate and rhythm. S1,S2 noted.  No murmur, rubs or gallops noted.  Pulmonary/Chest: Normal effort and positive vesicular breath sounds. No respiratory distress. No wheezes, rales or ronchi noted.  Neurological: Alert and oriented. Coordination normal.    BMET    Component Value Date/Time   NA 137 11/07/2013 1442   K 4.2 11/07/2013 1442   CL 100 11/07/2013 1442   CO2 29 11/07/2013 1442   GLUCOSE 86 11/07/2013 1442   BUN 19 11/07/2013 1442   CREATININE 0.9 11/07/2013 1442   CALCIUM 9.1 11/07/2013 1442    Lipid Panel     Component Value Date/Time   CHOL 179 10/17/2013 0542   TRIG 46 10/17/2013 0542   HDL 77* 10/17/2013 0542   LDLCALC 93 10/17/2013 0542    CBC    Component Value Date/Time   WBC 5.7 11/07/2013 1442   RBC 4.01 11/07/2013 1442   HGB 12.3 11/07/2013 1442   HCT 37.3 11/07/2013 1442   PLT 209.0 11/07/2013  1442   MCV 92.9 11/07/2013 1442   MCHC 33.1 11/07/2013 1442   RDW 13.1 11/07/2013 1442   LYMPHSABS 1.6 11/07/2013 1442   MONOABS 0.5 11/07/2013 1442   EOSABS 0.0 11/07/2013 1442   BASOSABS 0.0 11/07/2013 1442    Hgb A1C Lab Results  Component Value Date   HGBA1C 5.3 10/17/2013         Assessment & Plan:  Vasovagal symptoms:  Orthostatics normal ECG today  normal Advised her to push fluids and make sure she is making position changes slowly She would like to see a cardiologist for further evaluation Cardiology referral placed- Rosaria Ferries will call you to set up the cardiology appt  Follow up with PCP if symptoms persist or worsen

## 2014-06-26 NOTE — Progress Notes (Signed)
Pre visit review using our clinic review tool, if applicable. No additional management support is needed unless otherwise documented below in the visit note. 

## 2014-07-03 ENCOUNTER — Encounter: Payer: Self-pay | Admitting: Family Medicine

## 2014-07-03 ENCOUNTER — Ambulatory Visit (INDEPENDENT_AMBULATORY_CARE_PROVIDER_SITE_OTHER): Payer: BLUE CROSS/BLUE SHIELD | Admitting: Family Medicine

## 2014-07-03 VITALS — BP 114/67 | HR 48 | Wt 130.0 lb

## 2014-07-03 DIAGNOSIS — Z01419 Encounter for gynecological examination (general) (routine) without abnormal findings: Secondary | ICD-10-CM | POA: Diagnosis not present

## 2014-07-03 DIAGNOSIS — Z1151 Encounter for screening for human papillomavirus (HPV): Secondary | ICD-10-CM

## 2014-07-03 DIAGNOSIS — Z124 Encounter for screening for malignant neoplasm of cervix: Secondary | ICD-10-CM

## 2014-07-03 DIAGNOSIS — Z30431 Encounter for routine checking of intrauterine contraceptive device: Secondary | ICD-10-CM | POA: Diagnosis not present

## 2014-07-03 DIAGNOSIS — Q784 Enchondromatosis: Secondary | ICD-10-CM | POA: Insufficient documentation

## 2014-07-03 NOTE — Patient Instructions (Signed)
Preventive Care for Adults A healthy lifestyle and preventive care can promote health and wellness. Preventive health guidelines for women include the following key practices.  A routine yearly physical is a good way to check with your health care provider about your health and preventive screening. It is a chance to share any concerns and updates on your health and to receive a thorough exam.  Visit your dentist for a routine exam and preventive care every 6 months. Brush your teeth twice a day and floss once a day. Good oral hygiene prevents tooth decay and gum disease.  The frequency of eye exams is based on your age, health, family medical history, use of contact lenses, and other factors. Follow your health care provider's recommendations for frequency of eye exams.  Eat a healthy diet. Foods like vegetables, fruits, whole grains, low-fat dairy products, and lean protein foods contain the nutrients you need without too many calories. Decrease your intake of foods high in solid fats, added sugars, and salt. Eat the right amount of calories for you.Get information about a proper diet from your health care provider, if necessary.  Regular physical exercise is one of the most important things you can do for your health. Most adults should get at least 150 minutes of moderate-intensity exercise (any activity that increases your heart rate and causes you to sweat) each week. In addition, most adults need muscle-strengthening exercises on 2 or more days a week.  Maintain a healthy weight. The body mass index (BMI) is a screening tool to identify possible weight problems. It provides an estimate of body fat based on height and weight. Your health care provider can find your BMI and can help you achieve or maintain a healthy weight.For adults 20 years and older:  A BMI below 18.5 is considered underweight.  A BMI of 18.5 to 24.9 is normal.  A BMI of 25 to 29.9 is considered overweight.  A BMI of  30 and above is considered obese.  Maintain normal blood lipids and cholesterol levels by exercising and minimizing your intake of saturated fat. Eat a balanced diet with plenty of fruit and vegetables. Blood tests for lipids and cholesterol should begin at age 76 and be repeated every 5 years. If your lipid or cholesterol levels are high, you are over 50, or you are at high risk for heart disease, you may need your cholesterol levels checked more frequently.Ongoing high lipid and cholesterol levels should be treated with medicines if diet and exercise are not working.  If you smoke, find out from your health care provider how to quit. If you do not use tobacco, do not start.  Lung cancer screening is recommended for adults aged 22-80 years who are at high risk for developing lung cancer because of a history of smoking. A yearly low-dose CT scan of the lungs is recommended for people who have at least a 30-pack-year history of smoking and are a current smoker or have quit within the past 15 years. A pack year of smoking is smoking an average of 1 pack of cigarettes a day for 1 year (for example: 1 pack a day for 30 years or 2 packs a day for 15 years). Yearly screening should continue until the smoker has stopped smoking for at least 15 years. Yearly screening should be stopped for people who develop a health problem that would prevent them from having lung cancer treatment.  If you are pregnant, do not drink alcohol. If you are breastfeeding,  be very cautious about drinking alcohol. If you are not pregnant and choose to drink alcohol, do not have more than 1 drink per day. One drink is considered to be 12 ounces (355 mL) of beer, 5 ounces (148 mL) of wine, or 1.5 ounces (44 mL) of liquor.  Avoid use of street drugs. Do not share needles with anyone. Ask for help if you need support or instructions about stopping the use of drugs.  High blood pressure causes heart disease and increases the risk of  stroke. Your blood pressure should be checked at least every 1 to 2 years. Ongoing high blood pressure should be treated with medicines if weight loss and exercise do not work.  If you are 2-2 years old, ask your health care provider if you should take aspirin to prevent strokes.  Diabetes screening involves taking a blood sample to check your fasting blood sugar level. This should be done once every 3 years, after age 45, if you are within normal weight and without risk factors for diabetes. Testing should be considered at a younger age or be carried out more frequently if you are overweight and have at least 1 risk factor for diabetes.  Breast cancer screening is essential preventive care for women. You should practice "breast self-awareness." This means understanding the normal appearance and feel of your breasts and may include breast self-examination. Any changes detected, no matter how small, should be reported to a health care provider. Women in their 62s and 30s should have a clinical breast exam (CBE) by a health care provider as part of a regular health exam every 1 to 3 years. After age 37, women should have a CBE every year. Starting at age 25, women should consider having a mammogram (breast X-ray test) every year. Women who have a family history of breast cancer should talk to their health care provider about genetic screening. Women at a high risk of breast cancer should talk to their health care providers about having an MRI and a mammogram every year.  Breast cancer gene (BRCA)-related cancer risk assessment is recommended for women who have family members with BRCA-related cancers. BRCA-related cancers include breast, ovarian, tubal, and peritoneal cancers. Having family members with these cancers may be associated with an increased risk for harmful changes (mutations) in the breast cancer genes BRCA1 and BRCA2. Results of the assessment will determine the need for genetic counseling and  BRCA1 and BRCA2 testing.  Routine pelvic exams to screen for cancer are no longer recommended for nonpregnant women who are considered low risk for cancer of the pelvic organs (ovaries, uterus, and vagina) and who do not have symptoms. Ask your health care provider if a screening pelvic exam is right for you.  If you have had past treatment for cervical cancer or a condition that could lead to cancer, you need Pap tests and screening for cancer for at least 20 years after your treatment. If Pap tests have been discontinued, your risk factors (such as having a new sexual partner) need to be reassessed to determine if screening should be resumed. Some women have medical problems that increase the chance of getting cervical cancer. In these cases, your health care provider may recommend more frequent screening and Pap tests.  The HPV test is an additional test that may be used for cervical cancer screening. The HPV test looks for the virus that can cause the cell changes on the cervix. The cells collected during the Pap test can be  tested for HPV. The HPV test could be used to screen women aged 25 years and older, and should be used in women of any age who have unclear Pap test results. After the age of 58, women should have HPV testing at the same frequency as a Pap test.  Colorectal cancer can be detected and often prevented. Most routine colorectal cancer screening begins at the age of 68 years and continues through age 81 years. However, your health care provider may recommend screening at an earlier age if you have risk factors for colon cancer. On a yearly basis, your health care provider may provide home test kits to check for hidden blood in the stool. Use of a small camera at the end of a tube, to directly examine the colon (sigmoidoscopy or colonoscopy), can detect the earliest forms of colorectal cancer. Talk to your health care provider about this at age 27, when routine screening begins. Direct  exam of the colon should be repeated every 5-10 years through age 64 years, unless early forms of pre-cancerous polyps or small growths are found.  People who are at an increased risk for hepatitis B should be screened for this virus. You are considered at high risk for hepatitis B if:  You were born in a country where hepatitis B occurs often. Talk with your health care provider about which countries are considered high risk.  Your parents were born in a high-risk country and you have not received a shot to protect against hepatitis B (hepatitis B vaccine).  You have HIV or AIDS.  You use needles to inject street drugs.  You live with, or have sex with, someone who has hepatitis B.  You get hemodialysis treatment.  You take certain medicines for conditions like cancer, organ transplantation, and autoimmune conditions.  Hepatitis C blood testing is recommended for all people born from [birth year] through 1965 and any individual with known risks for hepatitis C.  Practice safe sex. Use condoms and avoid high-risk sexual practices to reduce the spread of sexually transmitted infections (STIs). STIs include gonorrhea, chlamydia, syphilis, trichomonas, herpes, HPV, and human immunodeficiency virus (HIV). Herpes, HIV, and HPV are viral illnesses that have no cure. They can result in disability, cancer, and death.  You should be screened for sexually transmitted illnesses (STIs) including gonorrhea and chlamydia if:  You are sexually active and are younger than 24 years.  You are older than 24 years and your health care provider tells you that you are at risk for this type of infection.  Your sexual activity has changed since you were last screened and you are at an increased risk for chlamydia or gonorrhea. Ask your health care provider if you are at risk.  If you are at risk of being infected with HIV, it is recommended that you take a prescription medicine daily to prevent HIV infection. This is  called preexposure prophylaxis (PrEP). You are considered at risk if:  You are a heterosexual woman, are sexually active, and are at increased risk for HIV infection.  You take drugs by injection.  You are sexually active with a partner who has HIV.  Talk with your health care provider about whether you are at high risk of being infected with HIV. If you choose to begin PrEP, you should first be tested for HIV. You should then be tested every 3 months for as long as you are taking PrEP.  Osteoporosis is a disease in which the bones lose minerals and strength  with aging. This can result in serious bone fractures or breaks. The risk of osteoporosis can be identified using a bone density scan. Women ages 65 years and over and women at risk for fractures or osteoporosis should discuss screening with their health care providers. Ask your health care provider whether you should take a calcium supplement or vitamin D to reduce the rate of osteoporosis.  Menopause can be associated with physical symptoms and risks. Hormone replacement therapy is available to decrease symptoms and risks. You should talk to your health care provider about whether hormone replacement therapy is right for you.  Use sunscreen. Apply sunscreen liberally and repeatedly throughout the day. You should seek shade when your shadow is shorter than you. Protect yourself by wearing long sleeves, pants, a wide-brimmed hat, and sunglasses year round, whenever you are outdoors.  Once a month, do a whole body skin exam, using a mirror to look at the skin on your back. Tell your health care provider of new moles, moles that have irregular borders, moles that are larger than a pencil eraser, or moles that have changed in shape or color.  Stay current with required vaccines (immunizations).  Influenza vaccine. All adults should be immunized every year.  Tetanus, diphtheria, and acellular pertussis (Td, Tdap) vaccine. Pregnant women should  receive 1 dose of Tdap vaccine during each pregnancy. The dose should be obtained regardless of the length of time since the last dose. Immunization is preferred during the 27th-36th week of gestation. An adult who has not previously received Tdap or who does not know her vaccine status should receive 1 dose of Tdap. This initial dose should be followed by tetanus and diphtheria toxoids (Td) booster doses every 10 years. Adults with an unknown or incomplete history of completing a 3-dose immunization series with Td-containing vaccines should begin or complete a primary immunization series including a Tdap dose. Adults should receive a Td booster every 10 years.  Varicella vaccine. An adult without evidence of immunity to varicella should receive 2 doses or a second dose if she has previously received 1 dose. Pregnant females who do not have evidence of immunity should receive the first dose after pregnancy. This first dose should be obtained before leaving the health care facility. The second dose should be obtained 4-8 weeks after the first dose.  Human papillomavirus (HPV) vaccine. Females aged 13-26 years who have not received the vaccine previously should obtain the 3-dose series. The vaccine is not recommended for use in pregnant females. However, pregnancy testing is not needed before receiving a dose. If a female is found to be pregnant after receiving a dose, no treatment is needed. In that case, the remaining doses should be delayed until after the pregnancy. Immunization is recommended for any person with an immunocompromised condition through the age of 26 years if she did not get any or all doses earlier. During the 3-dose series, the second dose should be obtained 4-8 weeks after the first dose. The third dose should be obtained 24 weeks after the first dose and 16 weeks after the second dose.  Zoster vaccine. One dose is recommended for adults aged 60 years or older unless certain conditions are  present.  Measles, mumps, and rubella (MMR) vaccine. Adults born before 1957 generally are considered immune to measles and mumps. Adults born in 1957 or later should have 1 or more doses of MMR vaccine unless there is a contraindication to the vaccine or there is laboratory evidence of immunity to   each of the three diseases. A routine second dose of MMR vaccine should be obtained at least 28 days after the first dose for students attending postsecondary schools, health care workers, or international travelers. People who received inactivated measles vaccine or an unknown type of measles vaccine during 1963-1967 should receive 2 doses of MMR vaccine. People who received inactivated mumps vaccine or an unknown type of mumps vaccine before 1979 and are at high risk for mumps infection should consider immunization with 2 doses of MMR vaccine. For females of childbearing age, rubella immunity should be determined. If there is no evidence of immunity, females who are not pregnant should be vaccinated. If there is no evidence of immunity, females who are pregnant should delay immunization until after pregnancy. Unvaccinated health care workers born before [birth year] who lack laboratory evidence of measles, mumps, or rubella immunity or laboratory confirmation of disease should consider measles and mumps immunization with 2 doses of MMR vaccine or rubella immunization with 1 dose of MMR vaccine.  Pneumococcal 13-valent conjugate (PCV13) vaccine. When indicated, a person who is uncertain of her immunization history and has no record of immunization should receive the PCV13 vaccine. An adult aged 72 years or older who has certain medical conditions and has not been previously immunized should receive 1 dose of PCV13 vaccine. This PCV13 should be followed with a dose of pneumococcal polysaccharide (PPSV23) vaccine. The PPSV23 vaccine dose should be obtained at least 8 weeks after the dose of PCV13 vaccine. An adult aged 53  years or older who has certain medical conditions and previously received 1 or more doses of PPSV23 vaccine should receive 1 dose of PCV13. The PCV13 vaccine dose should be obtained 1 or more years after the last PPSV23 vaccine dose.  Pneumococcal polysaccharide (PPSV23) vaccine. When PCV13 is also indicated, PCV13 should be obtained first. All adults aged 35 years and older should be immunized. An adult younger than age 46 years who has certain medical conditions should be immunized. Any person who resides in a nursing home or long-term care facility should be immunized. An adult smoker should be immunized. People with an immunocompromised condition and certain other conditions should receive both PCV13 and PPSV23 vaccines. People with human immunodeficiency virus (HIV) infection should be immunized as soon as possible after diagnosis. Immunization during chemotherapy or radiation therapy should be avoided. Routine use of PPSV23 vaccine is not recommended for American Indians, [place]atives, or people younger than 65 years unless there are medical conditions that require PPSV23 vaccine. When indicated, people who have unknown immunization and have no record of immunization should receive PPSV23 vaccine. One-time revaccination 5 years after the first dose of PPSV23 is recommended for people aged 19-64 years who have chronic kidney failure, nephrotic syndrome, asplenia, or immunocompromised conditions. People who received 1-2 doses of PPSV23 before age 55 years should receive another dose of PPSV23 vaccine at age 69 years or later if at least 5 years have passed since the previous dose. Doses of PPSV23 are not needed for people immunized with PPSV23 at or after age 32 years.  Meningococcal vaccine. Adults with asplenia or persistent complement component deficiencies should receive 2 doses of quadrivalent meningococcal conjugate (MenACWY-D) vaccine. The doses should be obtained at least 2 months apart.  Microbiologists working with certain meningococcal bacteria, [place] recruits, people at risk during an outbreak, and people who travel to or live in countries with a high rate of meningitis should be immunized. A first-year college student up through age  21 years who is living in a residence hall should receive a dose if she did not receive a dose on or after her 16th birthday. Adults who have certain high-risk conditions should receive one or more doses of vaccine.  Hepatitis A vaccine. Adults who wish to be protected from this disease, have certain high-risk conditions, work with hepatitis A-infected animals, work in hepatitis A research labs, or travel to or work in countries with a high rate of hepatitis A should be immunized. Adults who were previously unvaccinated and who anticipate close contact with an international adoptee during the first 60 days after arrival in the [place] from a country with a high rate of hepatitis A should be immunized.  Hepatitis B vaccine. Adults who wish to be protected from this disease, have certain high-risk conditions, may be exposed to blood or other infectious body fluids, are household contacts or sex partners of hepatitis B positive people, are clients or workers in certain care facilities, or travel to or work in countries with a high rate of hepatitis B should be immunized.  Haemophilus influenzae type b (Hib) vaccine. A previously unvaccinated person with asplenia or sickle cell disease or having a scheduled splenectomy should receive 1 dose of Hib vaccine. Regardless of previous immunization, a recipient of a hematopoietic stem cell transplant should receive a 3-dose series 6-12 months after her successful transplant. Hib vaccine is not recommended for adults with HIV infection. Preventive Services / Frequency Ages 64 to 68 years  Blood pressure check.** / Every 1 to 2 years.  Lipid and cholesterol check.** / Every 5 years beginning at age  22.  Clinical breast exam.** / Every 3 years for women in their 88s and 53s.  BRCA-related cancer risk assessment.** / For women who have family members with a BRCA-related cancer (breast, ovarian, tubal, or peritoneal cancers).  Pap test.** / Every 2 years from ages 90 through 51. Every 3 years starting at age 21 through age 56 or 3 with a history of 3 consecutive normal Pap tests.  HPV screening.** / Every 3 years from ages 24 through ages 1 to 46 with a history of 3 consecutive normal Pap tests.  Hepatitis C blood test.** / For any individual with known risks for hepatitis C.  Skin self-exam. / Monthly.  Influenza vaccine. / Every year.  Tetanus, diphtheria, and acellular pertussis (Tdap, Td) vaccine.** / Consult your health care provider. Pregnant women should receive 1 dose of Tdap vaccine during each pregnancy. 1 dose of Td every 10 years.  Varicella vaccine.** / Consult your health care provider. Pregnant females who do not have evidence of immunity should receive the first dose after pregnancy.  HPV vaccine. / 3 doses over 6 months, if 72 and younger. The vaccine is not recommended for use in pregnant females. However, pregnancy testing is not needed before receiving a dose.  Measles, mumps, rubella (MMR) vaccine.** / You need at least 1 dose of MMR if you were born in 1957 or later. You may also need a 2nd dose. For females of childbearing age, rubella immunity should be determined. If there is no evidence of immunity, females who are not pregnant should be vaccinated. If there is no evidence of immunity, females who are pregnant should delay immunization until after pregnancy.  Pneumococcal 13-valent conjugate (PCV13) vaccine.** / Consult your health care provider.  Pneumococcal polysaccharide (PPSV23) vaccine.** / 1 to 2 doses if you smoke cigarettes or if you have certain conditions.  Meningococcal vaccine.** /  1 dose if you are age 72 to 59 years and a [place] living in a residence hall, or have one of several medical conditions, you need to get vaccinated against meningococcal disease. You may also need additional booster doses.  Hepatitis A vaccine.** / Consult your health care provider.  Hepatitis B vaccine.** / Consult your health care provider.  Haemophilus influenzae type b (Hib) vaccine.** / Consult your health care provider. Ages 9 to 64 years  Blood pressure check.** / Every 1 to 2 years.  Lipid and cholesterol check.** / Every 5 years beginning at age 14 years.  Lung cancer screening. / Every year if you are aged 59-80 years and have a 30-pack-year history of smoking and currently smoke or have quit within the past 15 years. Yearly screening is stopped once you have quit smoking for at least 15 years or develop a health problem that would prevent you from having lung cancer treatment.  Clinical breast exam.** / Every year after age 34 years.  BRCA-related cancer risk assessment.** / For women who have family members with a BRCA-related cancer (breast, ovarian, tubal, or peritoneal cancers).  Mammogram.** / Every year beginning at age 59 years and continuing for as long as you are in good health. Consult with your health care provider.  Pap test.** / Every 3 years starting at age 81 years through age 75 or 48 years with a history of 3 consecutive normal Pap tests.  HPV screening.** / Every 3 years from ages 10 years through ages 64 to 51 years with a history of 3 consecutive normal Pap tests.  Fecal occult blood test (FOBT) of stool. / Every year beginning at age 94 years and continuing until age 25 years. You may not need to do this test if you get a colonoscopy every 10 years.  Flexible sigmoidoscopy or colonoscopy.** / Every 5 years for a flexible sigmoidoscopy or every 10 years for a colonoscopy beginning at age 69 years and continuing until age 23 years.  Hepatitis C blood test.** / For all people born from [birth year] through  1965 and any individual with known risks for hepatitis C.  Skin self-exam. / Monthly.  Influenza vaccine. / Every year.  Tetanus, diphtheria, and acellular pertussis (Tdap/Td) vaccine.** / Consult your health care provider. Pregnant women should receive 1 dose of Tdap vaccine during each pregnancy. 1 dose of Td every 10 years.  Varicella vaccine.** / Consult your health care provider. Pregnant females who do not have evidence of immunity should receive the first dose after pregnancy.  Zoster vaccine.** / 1 dose for adults aged 31 years or older.  Measles, mumps, rubella (MMR) vaccine.** / You need at least 1 dose of MMR if you were born in 1957 or later. You may also need a 2nd dose. For females of childbearing age, rubella immunity should be determined. If there is no evidence of immunity, females who are not pregnant should be vaccinated. If there is no evidence of immunity, females who are pregnant should delay immunization until after pregnancy.  Pneumococcal 13-valent conjugate (PCV13) vaccine.** / Consult your health care provider.  Pneumococcal polysaccharide (PPSV23) vaccine.** / 1 to 2 doses if you smoke cigarettes or if you have certain conditions.  Meningococcal vaccine.** / Consult your health care provider.  Hepatitis A vaccine.** / Consult your health care provider.  Hepatitis B vaccine.** / Consult your health care provider.  Haemophilus influenzae type b (Hib) vaccine.** / Consult your health care provider. Ages 25  years and over  Blood pressure check.** / Every 1 to 2 years.  Lipid and cholesterol check.** / Every 5 years beginning at age 33 years.  Lung cancer screening. / Every year if you are aged 48-80 years and have a 30-pack-year history of smoking and currently smoke or have quit within the past 15 years. Yearly screening is stopped once you have quit smoking for at least 15 years or develop a health problem that would prevent you from having lung cancer  treatment.  Clinical breast exam.** / Every year after age 76 years.  BRCA-related cancer risk assessment.** / For women who have family members with a BRCA-related cancer (breast, ovarian, tubal, or peritoneal cancers).  Mammogram.** / Every year beginning at age 73 years and continuing for as long as you are in good health. Consult with your health care provider.  Pap test.** / Every 3 years starting at age 81 years through age 45 or 58 years with 3 consecutive normal Pap tests. Testing can be stopped between 65 and 70 years with 3 consecutive normal Pap tests and no abnormal Pap or HPV tests in the past 10 years.  HPV screening.** / Every 3 years from ages 57 years through ages 65 or 71 years with a history of 3 consecutive normal Pap tests. Testing can be stopped between 65 and 70 years with 3 consecutive normal Pap tests and no abnormal Pap or HPV tests in the past 10 years.  Fecal occult blood test (FOBT) of stool. / Every year beginning at age 15 years and continuing until age 68 years. You may not need to do this test if you get a colonoscopy every 10 years.  Flexible sigmoidoscopy or colonoscopy.** / Every 5 years for a flexible sigmoidoscopy or every 10 years for a colonoscopy beginning at age 6 years and continuing until age 69 years.  Hepatitis C blood test.** / For all people born from [birth year] through 1963 and any individual with known risks for hepatitis C.  Osteoporosis screening.** / A one-time screening for women ages 36 years and over and women at risk for fractures or osteoporosis.  Skin self-exam. / Monthly.  Influenza vaccine. / Every year.  Tetanus, diphtheria, and acellular pertussis (Tdap/Td) vaccine.** / 1 dose of Td every 10 years.  Varicella vaccine.** / Consult your health care provider.  Zoster vaccine.** / 1 dose for adults aged 75 years or older.  Pneumococcal 13-valent conjugate (PCV13) vaccine.** / Consult your health care provider.  Pneumococcal  polysaccharide (PPSV23) vaccine.** / 1 dose for all adults aged 3 years and older.  Meningococcal vaccine.** / Consult your health care provider.  Hepatitis A vaccine.** / Consult your health care provider.  Hepatitis B vaccine.** / Consult your health care provider.  Haemophilus influenzae type b (Hib) vaccine.** / Consult your health care provider. ** Family history and personal history of risk and conditions may change your health care provider's recommendations. Document Released: 02/17/2001 Document Revised: 05/08/2013 Document Reviewed: 05/19/2010 [facility] Patient Information 2015 [place], [place]. This information is not intended to replace advice given to you by your health care provider. Make sure you discuss any questions you have with your health care provider.

## 2014-07-03 NOTE — Progress Notes (Signed)
  Subjective:     Carolyn Clements is a 44 y.o. female and is here for a comprehensive physical exam. The patient reports problems - she is seeing cardiology for vasovagal syncope this week. She can no longer feel her IUD strings after 8-9 months of being able to feel them.  History   Social History  . Marital Status: Married    Spouse Name: N/A  . Number of Children: N/A  . Years of Education: N/A   Occupational History  . physical therapist     Physical Therapist   Social History Main Topics  . Smoking status: Never Smoker   . Smokeless tobacco: Never Used  . Alcohol Use: 1.8 oz/week    3 Shots of liquor per week     Comment: occasional  . Drug Use: No  . Sexual Activity: Yes    Birth Control/ Protection: IUD   Other Topics Concern  . Not on file   Social History Narrative   Health Maintenance  Topic Date Due  . HIV Screening  10/23/1987  . PAP SMEAR  04/09/2013  . INFLUENZA VACCINE  08/06/2014  . TETANUS/TDAP  11/22/2021    The following portions of the patient's history were reviewed and updated as appropriate: allergies, current medications, past family history, past medical history, past social history, past surgical history and problem list.  Review of Systems A comprehensive review of systems was negative.   Objective:    BP 114/67 mmHg  Pulse 48  Wt 130 lb (58.968 kg) General appearance: alert, cooperative and appears stated age Head: Normocephalic, without obvious abnormality, atraumatic Neck: no adenopathy, supple, symmetrical, trachea midline and thyroid not enlarged, symmetric, no tenderness/mass/nodules Lungs: clear to auscultation bilaterally Breasts: normal appearance, no masses or tenderness Heart: regular rate and rhythm, S1, S2 normal, no murmur, click, rub or gallop Abdomen: soft, non-tender; bowel sounds normal; no masses,  no organomegaly Pelvic: cervix normal in appearance, external genitalia normal, no adnexal masses or tenderness, no  cervical motion tenderness, rectovaginal septum normal, uterus normal size, shape, and consistency, vagina normal without discharge and IUD strings not seen or felt Extremities: extremities normal, atraumatic, no cyanosis or edema Pulses: 2+ and symmetric Skin: Skin color, texture, turgor normal. No rashes or lesions Lymph nodes: Cervical, supraclavicular, and axillary nodes normal. Neurologic: Grossly normal    Assessment:    Healthy female exam. Missing IUD strings.      Plan:   Problem List Items Addressed This Visit    None    Visit Diagnoses    Screening for malignant neoplasm of cervix    -  Primary    Relevant Orders    Cytology - PAP    Encounter for routine gynecological examination         Does not want to start mammograms until 47 or 52     See After Visit Summary for Counseling Recommendations

## 2014-07-05 LAB — CYTOLOGY - PAP

## 2014-08-16 ENCOUNTER — Telehealth: Payer: Self-pay

## 2014-08-16 MED ORDER — FLUCONAZOLE 150 MG PO TABS: 150.0000 mg | ORAL_TABLET | Freq: Every day | ORAL | Status: DC

## 2014-08-16 NOTE — Telephone Encounter (Signed)
Pt would like for you to prescribe her deiflucan because she is gets yeast infections frequently.

## 2014-08-16 NOTE — Telephone Encounter (Signed)
Ok to refill,  Authorized in epic and sent  

## 2014-11-26 ENCOUNTER — Other Ambulatory Visit: Payer: Self-pay | Admitting: Internal Medicine

## 2014-11-26 NOTE — Telephone Encounter (Signed)
Please advise refill, last OV was 11/10/13 and 11/07/13 for labs.  Patient has been seen for acute issues with other providers.

## 2014-11-27 NOTE — Telephone Encounter (Signed)
Ok to refill,  Refill sent . Please  Offer an appt to discuss downgrading medication

## 2014-12-20 ENCOUNTER — Ambulatory Visit (INDEPENDENT_AMBULATORY_CARE_PROVIDER_SITE_OTHER): Payer: BLUE CROSS/BLUE SHIELD | Admitting: Family Medicine

## 2014-12-20 ENCOUNTER — Encounter: Payer: Self-pay | Admitting: Family Medicine

## 2014-12-20 VITALS — BP 110/68 | HR 83 | Temp 98.5°F | Ht 63.0 in | Wt 140.0 lb

## 2014-12-20 DIAGNOSIS — K649 Unspecified hemorrhoids: Secondary | ICD-10-CM | POA: Insufficient documentation

## 2014-12-20 DIAGNOSIS — R110 Nausea: Secondary | ICD-10-CM | POA: Insufficient documentation

## 2014-12-20 LAB — POCT URINE PREGNANCY: Preg Test, Ur: NEGATIVE

## 2014-12-20 MED ORDER — HYDROCORTISONE 2.5 % RE CREA: 1.0000 "application " | TOPICAL_CREAM | Freq: Two times a day (BID) | RECTAL | Status: DC

## 2014-12-20 MED ORDER — POLYETHYLENE GLYCOL 3350 17 GM/SCOOP PO POWD: 17.0000 g | Freq: Two times a day (BID) | ORAL | Status: DC | PRN

## 2014-12-20 NOTE — Progress Notes (Signed)
Pre visit review using our clinic review tool, if applicable. No additional management support is needed unless otherwise documented below in the visit note. 

## 2014-12-20 NOTE — Assessment & Plan Note (Addendum)
Patient with external hemorrhoids on exam. This is likely the result of constipation. We'll treat her with Anusol and sitz baths. Treat constipation with MiraLAX. We will give her stool cards to complete give a report of blood. She'll monitor this. She will follow up after discomfort is improved for rectal exam versus anoscopy. Given return precautions.

## 2014-12-20 NOTE — Assessment & Plan Note (Addendum)
Likely related to constipation versus postinfectious IBS versus Cymbalta. Benign abdominal exam today Negative urine pregnancy test in the office. We will treat her for constipation with MiraLAX. Offered antinausea medicine, though she declines. She'll see how she does with MiraLAX. She's given return precautions.

## 2014-12-20 NOTE — Progress Notes (Signed)
Carolyn Clements ID: Carolyn Clements, female   DOB: [birth year], 44 y.o.   MRN: [id number]  Marikay Alar, MD Phone: [phone]  Carolyn Clements is a 44 y.o. female who presents today for same-day visit.  She notes 2 weeks ago she had a stomach virus. She had nausea and vomiting for about 12-18 hours. This resolved on its own. She had no diarrhea or fever with this. She notes since that time she has had intermittent nausea whenever she tries to eat or drink anything. She also notes some bloating and gas. She denies abdominal pain. No recent vomiting or diarrhea. She notes having a daily bowel movement, that is different than her typical bowel movements. Normal in color. no blood in her stool, though she notes she has developed hemorrhoids and has had some minimal blood on wiping her rectum. She has several hemorrhoids have started to bother her and are moderately painful. She has used Preparation H wipes and cream with no benefit. She tried milk of magnesia with no benefit. She does note a history of reflux and takes Prilosec twice daily and notes this controls her reflux. She also notes she is on Cymbalta now and wonders if this could be causing her symptoms.  PMH: nonsmoker.   ROS see history of present illness  Objective  Physical Exam Filed Vitals:   12/20/14 1103  BP: 110/68  Pulse: 83  Temp: 98.5 F (36.9 C)    Physical Exam  Constitutional: She is well-developed, well-nourished, and in no distress.  HENT:  Head: Normocephalic and atraumatic.  Cardiovascular: Normal rate, regular rhythm and normal heart sounds.  Exam reveals no gallop and no friction rub.   No murmur heard. Pulmonary/Chest: Effort normal and breath sounds normal. No respiratory distress. She has no wheezes. She has no rales.  Abdominal: Soft. Bowel sounds are normal. She exhibits no distension. There is no tenderness. There is no rebound and no guarding.  Genitourinary:  External rectal exam reveals external  hemorrhoid at 3:00 and 6:00, non-thrombosed, these are moderately tender, there is no erythema or fluctuance, rectal exam deferred on my part given level of discomfort Carolyn Clements  Skin: Skin is warm and dry. She is not diaphoretic.     Assessment/Plan: Please see individual problem list.  Hemorrhoids Carolyn Clements with external hemorrhoids on exam. This is likely the result of constipation. We'll treat her with Anusol and sitz baths. Treat constipation with MiraLAX. We will give her stool cards to complete give a report of blood. She'll monitor this. She will follow up after discomfort is improved for rectal exam versus anoscopy. Given return precautions.  Nausea Likely related to constipation versus postinfectious IBS versus Cymbalta. Benign abdominal exam today Negative urine pregnancy test in the office. We will treat her for constipation with MiraLAX. Offered antinausea medicine, though she declines. She'll see how she does with MiraLAX. She's given return precautions.    Orders Placed This Encounter  Procedures  . POCT urine pregnancy    Meds ordered this encounter  Medications  . hydrocortisone (ANUSOL-HC) 2.5 % rectal cream    Sig: Place 1 application rectally 2 (two) times daily.    Dispense:  30 g    Refill:  0  . polyethylene glycol powder (GLYCOLAX/MIRALAX) powder    Sig: Take 17 g by mouth 2 (two) times daily as needed. Until having normal bowel movements    Dispense:  3350 g    Refill:  1    Dragon voice recognition software was used during  the dictation process of this note. If any phrases or words seem inappropriate it is likely secondary to the translation process being inefficient.  Marikay AlarEric Nakya Weyand

## 2014-12-20 NOTE — Patient Instructions (Signed)
Nice to meet you. Your nausea is likely related to some constipation or could be mild lingering effects from your recent viral illness. We will treat your constipation with MiraLAX twice daily until you have a normal bowel movement. We will start you on Anusol for your hemorrhoids.  If you're hemorrhoids are not improving in the next week please let us know. Once your hemorrhoids improve you should come back for follow-up exam. If you develop abdominal pain, fevers, vomiting, diarrhea, blood in your stool, or any new or change in symptoms please seek medical attention.

## 2015-04-15 DIAGNOSIS — J301 Allergic rhinitis due to pollen: Secondary | ICD-10-CM | POA: Diagnosis not present

## 2015-04-15 DIAGNOSIS — J351 Hypertrophy of tonsils: Secondary | ICD-10-CM | POA: Diagnosis not present

## 2015-04-15 DIAGNOSIS — J329 Chronic sinusitis, unspecified: Secondary | ICD-10-CM | POA: Diagnosis not present

## 2015-04-15 DIAGNOSIS — G479 Sleep disorder, unspecified: Secondary | ICD-10-CM | POA: Diagnosis not present

## 2015-04-23 DIAGNOSIS — G4700 Insomnia, unspecified: Secondary | ICD-10-CM | POA: Diagnosis not present

## 2015-04-23 DIAGNOSIS — J3503 Chronic tonsillitis and adenoiditis: Secondary | ICD-10-CM | POA: Diagnosis not present

## 2015-05-02 DIAGNOSIS — D169 Benign neoplasm of bone and articular cartilage, unspecified: Secondary | ICD-10-CM | POA: Diagnosis not present

## 2015-05-02 DIAGNOSIS — Q784 Enchondromatosis: Secondary | ICD-10-CM | POA: Diagnosis not present

## 2015-05-03 ENCOUNTER — Encounter: Payer: Self-pay | Admitting: Family Medicine

## 2015-05-03 ENCOUNTER — Ambulatory Visit (INDEPENDENT_AMBULATORY_CARE_PROVIDER_SITE_OTHER): Payer: BLUE CROSS/BLUE SHIELD | Admitting: Family Medicine

## 2015-05-03 VITALS — BP 110/62 | HR 90 | Temp 97.4°F | Ht 63.0 in | Wt 149.2 lb

## 2015-05-03 DIAGNOSIS — J329 Chronic sinusitis, unspecified: Secondary | ICD-10-CM | POA: Diagnosis not present

## 2015-05-03 MED ORDER — AMOXICILLIN-POT CLAVULANATE 875-125 MG PO TABS: 1.0000 | ORAL_TABLET | Freq: Two times a day (BID) | ORAL | Status: DC

## 2015-05-03 MED ORDER — FLUCONAZOLE 150 MG PO TABS: 150.0000 mg | ORAL_TABLET | Freq: Once | ORAL | Status: DC

## 2015-05-03 NOTE — Progress Notes (Signed)
Pre visit review using our clinic review tool, if applicable. No additional management support is needed unless otherwise documented below in the visit note. 

## 2015-05-03 NOTE — Patient Instructions (Signed)

## 2015-05-03 NOTE — Progress Notes (Signed)
Subjective:  Patient ID: Carolyn Clements, female    DOB: [birth year]  Age: 45 y.o. MRN: [id number]  CC: Sinus infection  HPI:  45 year old female with a history of recurrent sinus infections s/p sinus surgery x 2 presents with complaints of a sinus infection.  Patient states that she's had headache and sinus pressure particularly in the maxillary and frontal regions for the past 2 weeks. She states that it is moderate to severe. She states that she been using over-the-counter Mucinex and nasal saline with some improvement. No associated fevers or chills. No reports of nasal discharge. She states that this is how her satisfaction only present. No known exacerbating factors. No other complaints at this time.  Social Hx   Social History   Social History  . Marital Status: Married    Spouse Name: N/A  . Number of Children: N/A  . Years of Education: N/A   Occupational History  . physical therapist     Physical Therapist   Social History Main Topics  . Smoking status: Never Smoker   . Smokeless tobacco: Never Used  . Alcohol Use: 1.8 oz/week    3 Shots of liquor per week     Comment: occasional  . Drug Use: No  . Sexual Activity: Yes    Birth Control/ Protection: IUD   Other Topics Concern  . None   Social History Narrative   Review of Systems  Constitutional: Negative.   HENT: Positive for congestion and sinus pressure. Negative for rhinorrhea.    Objective:  BP 110/62 mmHg  Pulse 90  Temp(Src) 97.4 F (36.3 C) (Oral)  Ht 5\' 3"  (1.6 m)  Wt 149 lb 4 oz (67.699 kg)  BMI 26.44 kg/m2  SpO2 98%  BP/Weight 05/03/2015 12/20/2014 07/03/2014  Systolic BP 110 110 114  Diastolic BP 62 68 67  Wt. (Lbs) 149.25 140 130  BMI 26.44 24.81 23.03   Physical Exam  Constitutional: She is oriented to person, place, and time. She appears well-developed. No distress.  HENT:  Head: Normocephalic and atraumatic.  Oropharynx with mild erythema. Normal TMs bilaterally. Mild  maxillary sinus tenderness to palpation.  Cardiovascular: Normal rate and regular rhythm.   No murmur heard. Pulmonary/Chest: Effort normal. No respiratory distress. She has no wheezes. She has no rales.  Neurological: She is alert and oriented to person, place, and time.  Psychiatric: She has a normal mood and affect.  Vitals reviewed.  Lab Results  Component Value Date   WBC 5.7 11/07/2013   HGB 12.3 11/07/2013   HCT 37.3 11/07/2013   PLT 209.0 11/07/2013   GLUCOSE 86 11/07/2013   CHOL 179 10/17/2013   TRIG 46 10/17/2013   HDL 77* 10/17/2013   LDLCALC 93 10/17/2013   ALT 13 11/07/2013   AST 20 11/07/2013   NA 137 11/07/2013   K 4.2 11/07/2013   CL 100 11/07/2013   CREATININE 0.9 11/07/2013   BUN 19 11/07/2013   CO2 29 11/07/2013   TSH 1.01 11/07/2013   HGBA1C 5.3 10/17/2013    Assessment & Plan:   Problem List Items Addressed This Visit    Recurrent sinusitis - Primary    Established problem, acute exacerbation. Treating with Augmentin. Diflucan given and she is prone to yeast infections with antibiotic therapy.      Relevant Medications   amoxicillin-clavulanate (AUGMENTIN) 875-125 MG tablet   fluconazole (DIFLUCAN) 150 MG tablet      Meds ordered this encounter  Medications  . amoxicillin-clavulanate (AUGMENTIN) 875-125  MG tablet    Sig: Take 1 tablet by mouth 2 (two) times daily.    Dispense:  20 tablet    Refill:  0  . fluconazole (DIFLUCAN) 150 MG tablet    Sig: Take 1 tablet (150 mg total) by mouth once. Repeat dosing in 72 hours.    Dispense:  4 tablet    Refill:  1   Follow-up: PRN  Everlene Other DO [facility]

## 2015-05-03 NOTE — Assessment & Plan Note (Signed)
Established problem, acute exacerbation. Treating with Augmentin. Diflucan given and she is prone to yeast infections with antibiotic therapy.

## 2015-05-06 DIAGNOSIS — J351 Hypertrophy of tonsils: Secondary | ICD-10-CM | POA: Diagnosis not present

## 2015-05-06 DIAGNOSIS — G479 Sleep disorder, unspecified: Secondary | ICD-10-CM | POA: Diagnosis not present

## 2015-05-06 DIAGNOSIS — J301 Allergic rhinitis due to pollen: Secondary | ICD-10-CM | POA: Diagnosis not present

## 2015-05-07 ENCOUNTER — Encounter: Payer: Self-pay | Admitting: *Deleted

## 2015-05-16 ENCOUNTER — Ambulatory Visit: Payer: BLUE CROSS/BLUE SHIELD | Admitting: Anesthesiology

## 2015-05-16 ENCOUNTER — Encounter
Admission: RE | Disposition: A | Discharge status: Home / Self Care | Payer: Self-pay | Source: Ambulatory Visit | Attending: Otolaryngology

## 2015-05-16 ENCOUNTER — Ambulatory Visit
Admission: RE | Admit: 2015-05-16 | Discharge: 2015-05-16 | Disposition: A | Discharge status: Home / Self Care | Payer: BLUE CROSS/BLUE SHIELD | Source: Ambulatory Visit | Attending: Otolaryngology | Admitting: Otolaryngology

## 2015-05-16 DIAGNOSIS — Z91040 Latex allergy status: Secondary | ICD-10-CM | POA: Diagnosis not present

## 2015-05-16 DIAGNOSIS — Z885 Allergy status to narcotic agent status: Secondary | ICD-10-CM | POA: Diagnosis not present

## 2015-05-16 DIAGNOSIS — Z882 Allergy status to sulfonamides status: Secondary | ICD-10-CM | POA: Diagnosis not present

## 2015-05-16 DIAGNOSIS — J3501 Chronic tonsillitis: Secondary | ICD-10-CM | POA: Diagnosis not present

## 2015-05-16 DIAGNOSIS — J988 Other specified respiratory disorders: Secondary | ICD-10-CM | POA: Diagnosis not present

## 2015-05-16 DIAGNOSIS — Z8052 Family history of malignant neoplasm of bladder: Secondary | ICD-10-CM | POA: Diagnosis not present

## 2015-05-16 DIAGNOSIS — R51 Headache: Secondary | ICD-10-CM | POA: Diagnosis not present

## 2015-05-16 DIAGNOSIS — J351 Hypertrophy of tonsils: Secondary | ICD-10-CM | POA: Diagnosis not present

## 2015-05-16 DIAGNOSIS — Z825 Family history of asthma and other chronic lower respiratory diseases: Secondary | ICD-10-CM | POA: Diagnosis not present

## 2015-05-16 DIAGNOSIS — J310 Chronic rhinitis: Secondary | ICD-10-CM | POA: Insufficient documentation

## 2015-05-16 DIAGNOSIS — K219 Gastro-esophageal reflux disease without esophagitis: Secondary | ICD-10-CM | POA: Insufficient documentation

## 2015-05-16 DIAGNOSIS — Q386 Other congenital malformations of mouth: Secondary | ICD-10-CM | POA: Diagnosis not present

## 2015-05-16 DIAGNOSIS — Z801 Family history of malignant neoplasm of trachea, bronchus and lung: Secondary | ICD-10-CM | POA: Diagnosis not present

## 2015-05-16 DIAGNOSIS — Z79899 Other long term (current) drug therapy: Secondary | ICD-10-CM | POA: Diagnosis not present

## 2015-05-16 DIAGNOSIS — G479 Sleep disorder, unspecified: Secondary | ICD-10-CM | POA: Diagnosis not present

## 2015-05-16 HISTORY — DX: Enchondromatosis: Q78.4

## 2015-05-16 HISTORY — PX: TONSILLECTOMY: SHX5217

## 2015-05-16 HISTORY — DX: Motion sickness, initial encounter: T75.3XXA

## 2015-05-16 HISTORY — DX: Presence of spectacles and contact lenses: Z97.3

## 2015-05-16 SURGERY — TONSILLECTOMY
Anesthesia: General | Laterality: Bilateral | Wound class: Clean Contaminated

## 2015-05-16 MED ORDER — LACTATED RINGERS IV SOLN
INTRAVENOUS | Status: DC
  Administered 2015-05-16: 08:00:00 via INTRAVENOUS

## 2015-05-16 MED ORDER — OXYCODONE HCL 5 MG PO TABS: 5.0000 mg | ORAL_TABLET | Freq: Once | ORAL | Status: DC

## 2015-05-16 MED ORDER — DEXAMETHASONE SODIUM PHOSPHATE 4 MG/ML IJ SOLN
INTRAMUSCULAR | Status: DC | PRN
  Administered 2015-05-16: 10 mg via INTRAVENOUS

## 2015-05-16 MED ORDER — FENTANYL CITRATE (PF) 100 MCG/2ML IJ SOLN
25.0000 ug | INTRAMUSCULAR | Status: AC | PRN
  Administered 2015-05-16 (×4): 25 ug via INTRAVENOUS
  Administered 2015-05-16: 50 ug via INTRAVENOUS

## 2015-05-16 MED ORDER — ONDANSETRON HCL 4 MG/2ML IJ SOLN
4.0000 mg | Freq: Once | INTRAMUSCULAR | Status: AC | PRN
  Administered 2015-05-16: 4 mg via INTRAVENOUS

## 2015-05-16 MED ORDER — LIDOCAINE HCL (CARDIAC) 20 MG/ML IV SOLN
INTRAVENOUS | Status: DC | PRN
  Administered 2015-05-16: 40 mg via INTRAVENOUS

## 2015-05-16 MED ORDER — DIPHENHYDRAMINE HCL 50 MG/ML IJ SOLN
12.5000 mg | Freq: Four times a day (QID) | INTRAMUSCULAR | Status: DC | PRN
  Administered 2015-05-16: 12.5 mg via INTRAVENOUS

## 2015-05-16 MED ORDER — MIDAZOLAM HCL 5 MG/5ML IJ SOLN
INTRAMUSCULAR | Status: DC | PRN
  Administered 2015-05-16: 2 mg via INTRAVENOUS

## 2015-05-16 MED ORDER — DIPHENHYDRAMINE HCL 50 MG/ML IJ SOLN
12.5000 mg | Freq: Once | INTRAMUSCULAR | Status: AC
  Administered 2015-05-16: 12.5 mg via INTRAVENOUS

## 2015-05-16 MED ORDER — FENTANYL CITRATE (PF) 100 MCG/2ML IJ SOLN
INTRAMUSCULAR | Status: DC | PRN
Start: 2015-05-16 — End: 2015-05-16
  Administered 2015-05-16: 100 ug via INTRAVENOUS

## 2015-05-16 MED ORDER — PROPOFOL 10 MG/ML IV BOLUS
INTRAVENOUS | Status: DC | PRN
  Administered 2015-05-16: 140 mg via INTRAVENOUS
  Administered 2015-05-16: 30 mg via INTRAVENOUS

## 2015-05-16 MED ORDER — ACETAMINOPHEN 10 MG/ML IV SOLN
1000.0000 mg | Freq: Once | INTRAVENOUS | Status: AC
  Administered 2015-05-16: 1000 mg via INTRAVENOUS

## 2015-05-16 MED ORDER — SUCCINYLCHOLINE CHLORIDE 20 MG/ML IJ SOLN
INTRAMUSCULAR | Status: DC | PRN
  Administered 2015-05-16: 100 mg via INTRAVENOUS

## 2015-05-16 MED ORDER — GLYCOPYRROLATE 0.2 MG/ML IJ SOLN
INTRAMUSCULAR | Status: DC | PRN
  Administered 2015-05-16: 0.1 mg via INTRAVENOUS

## 2015-05-16 MED ORDER — LACTATED RINGERS IV SOLN: INTRAVENOUS | Status: DC

## 2015-05-16 MED ORDER — ONDANSETRON HCL 4 MG/2ML IJ SOLN
INTRAMUSCULAR | Status: DC | PRN
  Administered 2015-05-16: 4 mg via INTRAVENOUS

## 2015-05-16 MED ORDER — SCOPOLAMINE 1 MG/3DAYS TD PT72
1.0000 | MEDICATED_PATCH | Freq: Once | TRANSDERMAL | Status: DC
  Administered 2015-05-16: 1.5 mg via TRANSDERMAL

## 2015-05-16 MED ORDER — OXYCODONE HCL 5 MG/5ML PO SOLN
5.0000 mg | Freq: Once | ORAL | Status: AC
  Administered 2015-05-16: 5 mg via ORAL

## 2015-05-16 SURGICAL SUPPLY — 14 items
CANISTER SUCT 1200ML W/VALVE (MISCELLANEOUS) ×2 IMPLANT
ELECT CAUTERY BLADE TIP 2.5 (TIP) ×2
ELECTRODE CAUTERY BLDE TIP 2.5 (TIP) ×1 IMPLANT
GLOVE PI ULTRA LF STRL 7.5 (GLOVE) ×1 IMPLANT
GLOVE PI ULTRA NON LATEX 7.5 (GLOVE) ×1
KIT ROOM TURNOVER OR (KITS) ×2 IMPLANT
PACK TONSIL/ADENOIDS (PACKS) ×2 IMPLANT
PAD GROUND ADULT SPLIT (MISCELLANEOUS) ×2 IMPLANT
PENCIL ELECTRO HAND CTR (MISCELLANEOUS) ×2 IMPLANT
SOL ANTI-FOG 6CC FOG-OUT (MISCELLANEOUS) ×1 IMPLANT
SOL FOG-OUT ANTI-FOG 6CC (MISCELLANEOUS) ×1
STRAP BODY AND KNEE 60X3 (MISCELLANEOUS) ×2 IMPLANT
SUT VIC AB 3-0 SH 27 (SUTURE) ×2
SUT VIC AB 3-0 SH 27X BRD (SUTURE) IMPLANT

## 2015-05-16 NOTE — Anesthesia Postprocedure Evaluation (Signed)
Anesthesia Post Note  Patient: Carolyn Clements  Procedure(s) Performed: Procedure(s) (LRB): TONSILLECTOMY (Bilateral)  Patient location during evaluation: PACU Anesthesia Type: General Level of consciousness: awake and alert and oriented Pain management: satisfactory to patient Vital Signs Assessment: post-procedure vital signs reviewed and stable Respiratory status: spontaneous breathing, nonlabored ventilation and respiratory function stable Cardiovascular status: blood pressure returned to baseline and stable Postop Assessment: Adequate PO intake and No signs of nausea or vomiting Anesthetic complications: no    Cherly BeachStella, Lilliane Sposito J

## 2015-05-16 NOTE — Transfer of Care (Signed)
Immediate Anesthesia Transfer of Care Note  Patient: Carolyn Clements  Procedure(s) Performed: Procedure(s) with comments: TONSILLECTOMY (Bilateral) - Latex sensitivity  Patient Location: PACU  Anesthesia Type: General ETT  Level of Consciousness: awake, alert  and patient cooperative  Airway and Oxygen Therapy: Patient Spontanous Breathing and Patient connected to supplemental oxygen  Post-op Assessment: Post-op Vital signs reviewed, Patient's Cardiovascular Status Stable, Respiratory Function Stable, Patent Airway and No signs of Nausea or vomiting  Post-op Vital Signs: Reviewed and stable  Complications: No apparent anesthesia complications

## 2015-05-16 NOTE — Discharge Instructions (Signed)
T & A INSTRUCTION SHEET - [facility] °[facility] ° °CREIGHTON VAUGHT, MD °PAUL H. JUENGEL, MD  °P. SCOTT BENNETT °CHAPMAN MCQUEEN, MD ° °[address] , [place] [address] TEL. [phone] °[address] [place] [address] [phone] ° °INFORMATION SHEET FOR A TONSILLECTOMY AND ADENDOIDECTOMY ° °About Your Tonsils and Adenoids ° The tonsils and adenoids are normal body tissues that are part of our immune system.  They normally help to protect us against diseases that may enter our mouth and nose.  However, sometimes the tonsils and/or adenoids become too large and obstruct our breathing, especially at night. °  ° If either of these things happen it helps to remove the tonsils and adenoids in order to become healthier. The operation to remove the tonsils and adenoids is called a tonsillectomy and adenoidectomy. ° °The Location of Your Tonsils and Adenoids ° The tonsils are located in the back of the throat on both side and sit in a cradle of muscles. The adenoids are located in the roof of the mouth, behind the nose, and closely associated with the opening of the Eustachian tube to the ear. ° °Surgery on Tonsils and Adenoids ° A tonsillectomy and adenoidectomy is a short operation which takes about thirty minutes.  This includes being put to sleep and being awakened.  Tonsillectomies and adenoidectomies are performed at [facility] and may require observation period in the recovery room prior to going home. ° °Following the Operation for a Tonsillectomy ° A cautery machine is used to control bleeding.  Bleeding from a tonsillectomy and adenoidectomy is minimal and postoperatively the risk of bleeding is approximately four percent, although this rarely life threatening. ° ° ° °After your tonsillectomy and adenoidectomy post-op care at home: ° °1. Our patients are able to go home the same day.  You may be given prescriptions for pain  medications and antibiotics, if indicated. °2. It is extremely important to remember that fluid intake is of utmost importance after a tonsillectomy.  The amount that you drink must be maintained in the postoperative period.  A good indication of whether a child is getting enough fluid is whether his/her urine output is constant.  As long as children are urinating or wetting their diaper every 6 - 8 hours this is usually enough fluid intake.   °3. Although rare, this is a risk of some bleeding in the first ten days after surgery.  This is usually occurs between day five and nine postoperatively.  This risk of bleeding is approximately four percent.  If you or your child should have any bleeding you should remain calm and notify our office or go directly to the Emergency Room at [facility] where they will contact us. Our doctors are available seven days a week for notification.  We recommend sitting up quietly in a chair, place an ice pack on the front of the neck and spitting out the blood gently until we are able to contact you.  Adults should gargle gently with ice water and this may help stop the bleeding.  If the bleeding does not stop after a short time, i.e. 10 to 15 minutes, or seems to be increasing again, please contact us or go to the hospital.   °4. It is common for the pain to be worse at 5 - 7 days postoperatively.  This occurs because the “scab” is peeling off and the mucous membrane (skin of   the throat) is growing back where the tonsils were.   °5. It is common for a low-grade fever, less than 102, during the first week after a tonsillectomy and adenoidectomy.  It is usually due to not drinking enough liquids, and we suggest your use liquid Tylenol or the pain medicine with Tylenol prescribed in order to keep your temperature below 102.  Please follow the directions on the back of the bottle. °6. Do not take aspirin or any products that contain aspirin such as Bufferin, Anacin,  Ecotrin, aspirin gum, Goodies, BC headache powders, etc., after a T&A because it can promote bleeding.  Please check with our office before administering any other medication that may been prescribed by other doctors during the two week post-operative period. °7. If you happen to look in the mirror or into your child’s mouth you will see white/gray patches on the back of the throat.  This is what a scab looks like in the mouth and is normal after having a T&A.  It will disappear once the tonsil area heals completely. However, it may cause a noticeable odor, and this too will disappear with time.     °8. You or your child may experience ear pain after having a T&A.  This is called referred pain and comes from the throat, but it is felt in the ears.  Ear pain is quite common and expected.  It will usually go away after ten days.  There is usually nothing wrong with the ears, and it is primarily due to the healing area stimulating the nerve to the ear that runs along the side of the throat.  Use either the prescribed pain medicine or Tylenol as needed.  °9. The throat tissues after a tonsillectomy are obviously sensitive.  Smoking around children who have had a tonsillectomy significantly increases the risk of bleeding.  DO NOT SMOKE!  ° °General Anesthesia, Adult, Care After °Refer to this sheet in the next few weeks. These instructions provide you with information on caring for yourself after your procedure. Your health care provider may also give you more specific instructions. Your treatment has been planned according to current medical practices, but problems sometimes occur. Call your health care provider if you have any problems or questions after your procedure. °WHAT TO EXPECT AFTER THE PROCEDURE °After the procedure, it is typical to experience: °· Sleepiness. °· Nausea and vomiting. °HOME CARE INSTRUCTIONS °· For the first 24 hours after general anesthesia: °¨ Have a responsible person with you. °¨ Do not  drive a car. If you are alone, do not take public transportation. °¨ Do not drink alcohol. °¨ Do not take medicine that has not been prescribed by your health care provider. °¨ Do not sign important papers or make important decisions. °¨ You may resume a normal diet and activities as directed by your health care provider. °· Change bandages (dressings) as directed. °· If you have questions or problems that seem related to general anesthesia, call the hospital and ask for the anesthetist or anesthesiologist on call. °SEEK MEDICAL CARE IF: °· You have nausea and vomiting that continue the day after anesthesia. °· You develop a rash. °SEEK IMMEDIATE MEDICAL CARE IF:  °· You have difficulty breathing. °· You have chest pain. °· You have any allergic problems. °  °This information is not intended to replace advice given to you by your health care provider. Make sure you discuss any questions you have with your health care provider. °  °Document   Released: 03/30/2000 Document Revised: 01/12/2014 Document Reviewed: 04/22/2011 Elsevier Interactive Patient Education 2016 [employer]. Scopolamine skin patches REMOVE IN 72 HOURS. WASH HANDS IMMEDIATELY AFTER. What is this medicine? SCOPOLAMINE (skoe POL a meen) is used to prevent nausea and vomiting caused by motion sickness, anesthesia and surgery. This medicine may be used for other purposes; ask your health care provider or pharmacist if you have questions. What should I tell my health care provider before I take this medicine? They need to know if you have any of these conditions: -glaucoma -kidney or liver disease -an unusual or allergic reaction (especially skin allergy) to scopolamine, atropine, other medicines, foods, dyes, or preservatives -pregnant or trying to get pregnant -breast-feeding How should I use this medicine? This medicine is for external use only. Follow the directions on the prescription label. One patch contains enough medicine to  prevent motion sickness for up to 3 days. Apply the patch at least 4 hours before you need it and only wear one disc at a time. Choose an area behind the ear, that is clean, dry, hairless and free from any cuts or irritation. Wipe the area with a clean dry tissue. Peel off the plastic backing of the skin patch, trying not to touch the adhesive side with your hands. Do not cut the patches. Firmly apply to the area you have chosen, with the metallic side of the patch to the skin and the tan-colored side showing. Once firmly in place, wash your hands well with soap and water. Remove the disc after 3 days, or sooner if you no longer need it. After removing the patch, wash your hands and the area behind your ear thoroughly with soap and water. The patch will still contain some medicine after use. To avoid accidental contact or ingestion by children or pets, fold the used patch in half with the sticky side together and throw away in the trash out of the reach of children and pets. If you need to use a second patch after you remove the first, place it behind the other ear. Talk to your pediatrician regarding the use of this medicine in children. Special care may be needed. Overdosage: If you think you have taken too much of this medicine contact a poison control center or emergency room at once. NOTE: This medicine is only for you. Do not share this medicine with others. What if I miss a dose? Make sure you apply the patch at least 4 hours before you need it. You can apply it the night before traveling. What may interact with this medicine? -benztropine -bethanechol -medicines for anxiety or sleeping problems like diazepam or temazepam -medicines for hay fever and other allergies -medicines for mental depression -muscle relaxants This list may not describe all possible interactions. Give your health care provider a list of all the medicines, herbs, non-prescription drugs, or dietary supplements you use. Also  tell them if you smoke, drink alcohol, or use illegal drugs. Some items may interact with your medicine. What should I watch for while using this medicine? Keep the patch dry, if possible, to prevent it from falling off. Limited contact with water, however, as in bathing or swimming, will not affect the system. If the patch falls off, throw it away and put a new one behind the other ear. You may get drowsy or dizzy. Do not drive, use machinery, or do anything that needs mental alertness until you know how this medicine affects you. Do not stand or sit up quickly,  especially if you are an older patient. This reduces the risk of dizzy or fainting spells. Alcohol may interfere with the effect of this medicine. Avoid alcoholic drinks. Your mouth may get dry. Chewing sugarless gum or sucking hard candy, and drinking plenty of water may help. Contact your doctor if the problem does not go away or is severe. This medicine may cause dry eyes and blurred vision. If you wear contact lenses you may feel some discomfort. Lubricating drops may help. See your eye doctor if the problem does not go away or is severe. If you are going to have a magnetic resonance imaging (MRI) procedure, tell your MRI technician if you have this patch on your body. It must be removed before a MRI. What side effects may I notice from receiving this medicine? Side effects that you should report to your doctor or health care professional as soon as possible: -agitation, nervousness, confusion -blurred vision and other eye problems -dizziness, drowsiness -eye pain or redness in the whites of the eye -hallucinations -pain or difficulty passing urine -skin rash, itching -vomiting Side effects that usually do not require medical attention (report to your doctor or health care professional if they continue or are bothersome): -headache -nausea This list may not describe all possible side effects. Call your doctor for medical advice about  side effects. You may report side effects to FDA at 1-800-FDA-1088. Where should I keep my medicine? Keep out of the reach of children. Store at room temperature between 20 and 25 degrees C (68 and 77 degrees F). Throw away any unused medicine after the expiration date. When you remove a patch, fold it and throw it in the trash as described above. NOTE: This sheet is a summary. It may not cover all possible information. If you have questions about this medicine, talk to your doctor, pharmacist, or health care provider.    2016, Elsevier/Gold Standard. (2011-05-21 13:31:48)

## 2015-05-16 NOTE — Anesthesia Preprocedure Evaluation (Signed)
Anesthesia Evaluation  Patient identified by MRN, date of birth, ID band  Reviewed: Allergy & Precautions, H&P , NPO status , Patient's Chart, lab work & pertinent test results  Airway Mallampati: II  TM Distance: >3 FB Neck ROM: full    Dental no notable dental hx.    Pulmonary    Pulmonary exam normal        Cardiovascular  Rhythm:regular Rate:Normal     Neuro/Psych  Headaches,    GI/Hepatic GERD  ,  Endo/Other    Renal/GU      Musculoskeletal   Abdominal   Peds  Hematology   Anesthesia Other Findings   Reproductive/Obstetrics                             Anesthesia Physical Anesthesia Plan  ASA: II  Anesthesia Plan: General ETT   Post-op Pain Management:    Induction:   Airway Management Planned:   Additional Equipment:   Intra-op Plan:   Post-operative Plan:   Informed Consent: I have reviewed the patients History and Physical, chart, labs and discussed the procedure including the risks, benefits and alternatives for the proposed anesthesia with the patient or authorized representative who has indicated his/her understanding and acceptance.     Plan Discussed with: CRNA  Anesthesia Plan Comments:         Anesthesia Quick Evaluation

## 2015-05-16 NOTE — H&P (Signed)
  H&P has been reviewed and no changes necessary. To be downloaded later. 

## 2015-05-16 NOTE — Anesthesia Procedure Notes (Signed)
Procedure Name: Intubation Date/Time: 05/16/2015 8:41 AM Performed by: Jimmy PicketAMYOT, Carolyn Clements Pre-anesthesia Checklist: Patient identified, Emergency Drugs available, Suction available, Patient being monitored and Timeout performed Patient Re-evaluated:Patient Re-evaluated prior to inductionOxygen Delivery Method: Circle system utilized Preoxygenation: Pre-oxygenation with 100% oxygen Intubation Type: IV induction Ventilation: Mask ventilation without difficulty Laryngoscope Size: Miller and 2 Grade View: Grade I Tube type: Oral Rae Tube size: 7.0 mm Number of attempts: 1 Placement Confirmation: ETT inserted through vocal cords under direct vision,  positive ETCO2 and breath sounds checked- equal and bilateral Tube secured with: Tape Dental Injury: Teeth and Oropharynx as per pre-operative assessment

## 2015-05-16 NOTE — Op Note (Signed)
05/16/2015  9:08 AM    Carolyn Clements, Carolyn Clements  [id number]   Pre-Op Dx:  Chronic tonsillitis, tonsillar hypertrophy causing airway obstruction  Post-op Dx: Same  Proc: Tonsillectomy   Surg:  Igor Bishop H  Anes:  GOT  EBL:  20 mL  Comp:  None  Findings:  Fairly large tonsils with a long palate and uvula  Procedure: Patient was given general anesthesia by oral endotracheal intubation. A Davis mouth gag was used to visualize the oropharynx. The tonsils were enlarged and hidden behind the large anterior tonsillar pillar. Soft palate and uvula overlong. There were elevated to visualize the nasopharynx which was clear. The tonsils were grasped and pulled medially and there anterior pillar incised with electrocautery. The tonsil was dissected from its fossae using blunt dissection and electrocautery. Bleeding was controlled with direct pressure and electrocautery. Once tonsils were removed. Incision was created in vertical fashion on both sides of the uvula for about a quarter-inch. This incise the posterior tonsillar pillar attachment that was lateralized and sutured to the anterior tonsillar pillar superiorly with a 3-0 Vicryl suture. Was done on both sides. The short palate slightly. The uvula was very long and approximately 3/8 of an inch of uvula was trimmed using electrocautery. There is no bleeding here. This left a larger open airway on both sides and larger opening into the nasopharynx with the shorter palate and uvula.. The patient tolerated the procedure well. She was awakened taken to the recovery room in satisfactory condition. There were no operative complications.  Dispo:   To PACU to be discharged home  Plan:  To follow-up in the office in 2 weeks, sooner if problems. He is listed as allergic to codeine products but it was oxycodone that gave her major issues. We discussed this and she wants to try using some hydrocodone and see if she tolerates that okay. If not we will have to use  tramadol pills instead.  Carolyn Clements H  05/16/2015 9:08 AM

## 2015-05-17 ENCOUNTER — Encounter: Payer: Self-pay | Admitting: Otolaryngology

## 2015-05-20 LAB — SURGICAL PATHOLOGY

## 2015-05-30 DIAGNOSIS — J301 Allergic rhinitis due to pollen: Secondary | ICD-10-CM | POA: Diagnosis not present

## 2015-06-15 ENCOUNTER — Other Ambulatory Visit: Payer: Self-pay | Admitting: Internal Medicine

## 2015-06-17 NOTE — Telephone Encounter (Signed)
NO with Primary since 11/15 saw dr. Adriana Simasook 4/17 ok to fill omeprazole?

## 2015-06-18 ENCOUNTER — Ambulatory Visit (INDEPENDENT_AMBULATORY_CARE_PROVIDER_SITE_OTHER): Payer: BLUE CROSS/BLUE SHIELD | Admitting: Family

## 2015-06-18 ENCOUNTER — Other Ambulatory Visit (HOSPITAL_COMMUNITY)
Admission: RE | Admit: 2015-06-18 | Discharge: 2015-06-18 | Disposition: A | Discharge status: Home / Self Care | Payer: BLUE CROSS/BLUE SHIELD | Source: Ambulatory Visit | Attending: Family | Admitting: Family

## 2015-06-18 VITALS — BP 100/60 | HR 61 | Temp 98.1°F | Ht 62.5 in | Wt 144.0 lb

## 2015-06-18 DIAGNOSIS — L298 Other pruritus: Secondary | ICD-10-CM | POA: Diagnosis not present

## 2015-06-18 DIAGNOSIS — N898 Other specified noninflammatory disorders of vagina: Secondary | ICD-10-CM

## 2015-06-18 DIAGNOSIS — Z113 Encounter for screening for infections with a predominantly sexual mode of transmission: Secondary | ICD-10-CM | POA: Insufficient documentation

## 2015-06-18 MED ORDER — FLUCONAZOLE 150 MG PO TABS: 150.0000 mg | ORAL_TABLET | Freq: Once | ORAL | Status: DC

## 2015-06-18 NOTE — Progress Notes (Signed)
Subjective:    Patient ID: Carolyn Clements, female    DOB: Jun 01, 1970, 45 y.o.   MRN: [id number]   Carolyn Clements is a 45 y.o. female who presents today for an acute visit.    HPI Comments: Patient here for evaluation of vaginal itching, dryness worsening for one week. One day had thick,white discharge. Prone to yeast infection and had recent antibiotic use after tonsilectomy one month ago. Notes a couple of baths over this past week which may have caused it.  Would like to be tested for STDs as has 'never had this down and figure's why not.' No new partner ( married for many years) or increased suspicion for STD.  No pelvic pain, pain with sex, or adbominal pain.   Works as Product manager.   Past Medical History  Diagnosis Date  . Pyloric stenosis infant    Inguinal Hernia repair  . Anxiety   . GERD (gastroesophageal reflux disease)   . IUD     [facility]  Dr Shawnie Pons  . Screening for cervical cancer April 2012    normal PAP   . PONV (postoperative nausea and vomiting)   . MVP (mitral valve prolapse)     Saw cardiology for syncope 7/16.  . Wears contact lenses   . Motion sickness     cars - long trips  . Migraine headache     sinus related  . Enchondroma, multiple    Allergies: Codeine sulfate; Sulfa antibiotics; Tape; and Latex Current Outpatient Prescriptions on File Prior to Visit  Medication Sig Dispense Refill  . ketotifen (ZADITOR) 0.025 % ophthalmic solution Place 1 drop into both eyes 2 (two) times daily.      Marland Kitchen lamoTRIgine (LAMICTAL) 150 MG tablet Take 150 mg by mouth daily.    Marland Kitchen levonorgestrel (MIRENA) 20 MCG/24HR IUD 1 each by Intrauterine route once.    . metroNIDAZOLE (METROGEL) 0.75 % gel Apply 1 application topically 2 (two) times daily.    . NON FORMULARY Inject 2 each into the muscle once a week. Allergy shots from allergist    . omeprazole (PRILOSEC) 20 MG capsule TAKE ONE CAPSULE BY MOUTH TWICE A DAY 60 capsule 5  . Probiotic  Product (PROBIOTIC FORMULA PO) Take 1 capsule by mouth 2 (two) times daily.     . traZODone (DESYREL) 150 MG tablet Take 150 mg by mouth at bedtime.    . triamcinolone (NASACORT ALLERGY 24HR) 55 MCG/ACT AERO nasal inhaler Place 2 sprays into the nose daily.     No current facility-administered medications on file prior to visit.    Social History  Substance Use Topics  . Smoking status: Never Smoker   . Smokeless tobacco: Never Used  . Alcohol Use: 1.8 oz/week    3 Shots of liquor per week     Comment:      Review of Systems  Constitutional: Negative for fever and chills.  Respiratory: Negative for cough.   Cardiovascular: Negative for chest pain and palpitations.  Gastrointestinal: Negative for nausea, vomiting and abdominal pain.  Genitourinary: Positive for vaginal discharge. Negative for dysuria, hematuria, genital sores, vaginal pain, pelvic pain and dyspareunia.      Objective:    BP 100/60 mmHg  Pulse 61  Temp(Src) 98.1 F (36.7 C)  Ht 5' 2.5" (1.588 m)  Wt 144 lb (65.318 kg)  BMI 25.90 kg/m2  SpO2 98%   Physical Exam  Constitutional: She appears well-developed and well-nourished.  Eyes: Conjunctivae are  normal.  Cardiovascular: Normal rate, regular rhythm, normal heart sounds and normal pulses.   Pulmonary/Chest: Effort normal and breath sounds normal. She has no wheezes. She has no rhonchi. She has no rales.  Abdominal: There is no CVA tenderness.  Genitourinary: There is no rash, tenderness or lesion on the right labia. There is no rash, tenderness or lesion on the left labia. Cervix exhibits no motion tenderness, no discharge and no friability. Right adnexum displays no mass, no tenderness and no fullness. Left adnexum displays no mass, no tenderness and no fullness. There is erythema in the vagina. No tenderness or bleeding in the vagina. No foreign body around the vagina. Vaginal discharge (thick, white) found.  Diffuse vulvovaginal erythema. No lesions.  Discharge is thick and white, not purulent. No lesions.   Neurological: She is alert.  Skin: Skin is warm and dry.  Psychiatric: She has a normal mood and affect. Her speech is normal and behavior is normal. Thought content normal.  Vitals reviewed.      Assessment & Plan:   1. Vaginal itching Suspect vulvovaginal candidiasis due to erythema, dryness, thick discharge. Patient and I agreed to treat empirically pending wet prep. Pending STD testing as requested; declined HIV or syphillis testing today.   - fluconazole (DIFLUCAN) 150 MG tablet; Take 1 tablet (150 mg total) by mouth once. Take one tablet PO once. If continue to have symptoms, may take one tablet PO 3 days later.  Dispense: 2 tablet; Refill: 1 - Wet prep, genital; Standing - GC/Chlamydia probe amp ([place]; Standing - Wet prep, genital - GC/Chlamydia probe amp ([place])[facility]    I have discontinued Carolyn Clements's FINACEA, hydrocortisone, and polyethylene glycol powder. I am also having her maintain her Probiotic Product (PROBIOTIC FORMULA PO), ketotifen, traZODone, levonorgestrel, lamoTRIgine, NON FORMULARY, triamcinolone, omeprazole, and metroNIDAZOLE.   No orders of the defined types were placed in this encounter.     Start medications as prescribed and explained to patient on After Visit Summary ( AVS). Risks, benefits, and alternatives of the medications and treatment plan prescribed today were discussed, and patient expressed understanding.   Education regarding symptom management and diagnosis given to patient.   Follow-up:Plan follow-up and return precautions given if any worsening symptoms or change in condition.   Continue to follow with Sherlene ShamsULLO, TERESA L, MD for routine health maintenance.   Carolyn Clements and I agreed with plan.   Rennie PlowmanMargaret Arnett, FNP

## 2015-06-18 NOTE — Patient Instructions (Signed)
Will call with all results.   Pleasure meeting you,   Claris CheMargaret  If there is no improvement in your symptoms, or if there is any worsening of symptoms, or if you have any additional concerns, please return for re-evaluation; or, if we are closed, consider going to the Emergency Room for evaluation if symptoms urgent.  Monilial Vaginitis Vaginitis in a soreness, swelling and redness (inflammation) of the vagina and vulva. Monilial vaginitis is not a sexually transmitted infection. CAUSES  Yeast vaginitis is caused by yeast (candida) that is normally found in your vagina. With a yeast infection, the candida has overgrown in number to a point that upsets the chemical balance. SYMPTOMS   White, thick vaginal discharge.  Swelling, itching, redness and irritation of the vagina and possibly the lips of the vagina (vulva).  Burning or painful urination.  Painful intercourse. DIAGNOSIS  Things that may contribute to monilial vaginitis are:  Postmenopausal and virginal states.  Pregnancy.  Infections.  Being tired, sick or stressed, especially if you had monilial vaginitis in the past.  Diabetes. Good control will help lower the chance.  Birth control pills.  Tight fitting garments.  Using bubble bath, feminine sprays, douches or deodorant tampons.  Taking certain medications that kill germs (antibiotics).  Sporadic recurrence can occur if you become ill. TREATMENT  Your caregiver will give you medication.  There are several kinds of anti monilial vaginal creams and suppositories specific for monilial vaginitis. For recurrent yeast infections, use a suppository or cream in the vagina 2 times a week, or as directed.  Anti-monilial or steroid cream for the itching or irritation of the vulva may also be used. Get your caregiver's permission.  Painting the vagina with methylene blue solution may help if the monilial cream does not work.  Eating yogurt may help prevent monilial  vaginitis. HOME CARE INSTRUCTIONS   Finish all medication as prescribed.  Do not have sex until treatment is completed or after your caregiver tells you it is okay.  Take warm sitz baths.  Do not douche.  Do not use tampons, especially scented ones.  Wear cotton underwear.  Avoid tight pants and panty hose.  Tell your sexual partner that you have a yeast infection. They should go to their caregiver if they have symptoms such as mild rash or itching.  Your sexual partner should be treated as well if your infection is difficult to eliminate.  Practice safer sex. Use condoms.  Some vaginal medications cause latex condoms to fail. Vaginal medications that harm condoms are:  Cleocin cream.  Butoconazole (Femstat).  Terconazole (Terazol) vaginal suppository.  Miconazole (Monistat) (may be purchased over the counter). SEEK MEDICAL CARE IF:   You have a temperature by mouth above 102 F (38.9 C).  The infection is getting worse after 2 days of treatment.  The infection is not getting better after 3 days of treatment.  You develop blisters in or around your vagina.  You develop vaginal bleeding, and it is not your menstrual period.  You have pain when you urinate.  You develop intestinal problems.  You have pain with sexual intercourse.   This information is not intended to replace advice given to you by your health care provider. Make sure you discuss any questions you have with your health care provider.   Document Released: 10/01/2004 Document Revised: 03/16/2011 Document Reviewed: 06/25/2014 Elsevier Interactive Patient Education [employer]2016 Elsevier Inc.

## 2015-06-18 NOTE — Addendum Note (Signed)
Addended by: Montine CircleMALDONADO, Kostantinos Tallman D on: 06/18/2015 08:56 AM   Modules accepted: Orders

## 2015-06-18 NOTE — Telephone Encounter (Signed)
filled

## 2015-06-19 ENCOUNTER — Telehealth: Payer: Self-pay | Admitting: Family

## 2015-06-19 DIAGNOSIS — N760 Acute vaginitis: Principal | ICD-10-CM

## 2015-06-19 DIAGNOSIS — B9689 Other specified bacterial agents as the cause of diseases classified elsewhere: Secondary | ICD-10-CM

## 2015-06-19 LAB — GC/CHLAMYDIA PROBE AMP (~~LOC~~) NOT AT ARMC
CHLAMYDIA, DNA PROBE: NEGATIVE
NEISSERIA GONORRHEA: NEGATIVE

## 2015-06-19 LAB — WET PREP BY MOLECULAR PROBE
CANDIDA SPECIES: POSITIVE — AB
Gardnerella vaginalis: POSITIVE — AB
Trichomonas vaginosis: NEGATIVE

## 2015-06-19 MED ORDER — METRONIDAZOLE 500 MG PO TABS: 500.0000 mg | ORAL_TABLET | Freq: Two times a day (BID) | ORAL | Status: DC

## 2015-06-19 NOTE — Telephone Encounter (Signed)
Please call patient and let her know the results of wet prep showed bacterial vaginosis as well as yeast infection. I sent and another prescription to pharmacy for patient.

## 2015-06-19 NOTE — Telephone Encounter (Signed)
Pt was notified of results. Pt verbalized understanding.

## 2015-06-25 DIAGNOSIS — F411 Generalized anxiety disorder: Secondary | ICD-10-CM | POA: Diagnosis not present

## 2015-06-25 DIAGNOSIS — F3181 Bipolar II disorder: Secondary | ICD-10-CM | POA: Diagnosis not present

## 2015-07-04 DIAGNOSIS — J301 Allergic rhinitis due to pollen: Secondary | ICD-10-CM | POA: Diagnosis not present

## 2015-07-25 DIAGNOSIS — H5213 Myopia, bilateral: Secondary | ICD-10-CM | POA: Diagnosis not present

## 2015-08-08 DIAGNOSIS — J301 Allergic rhinitis due to pollen: Secondary | ICD-10-CM | POA: Diagnosis not present

## 2015-08-12 DIAGNOSIS — J301 Allergic rhinitis due to pollen: Secondary | ICD-10-CM | POA: Diagnosis not present

## 2015-08-15 DIAGNOSIS — J301 Allergic rhinitis due to pollen: Secondary | ICD-10-CM | POA: Diagnosis not present

## 2015-08-22 DIAGNOSIS — J301 Allergic rhinitis due to pollen: Secondary | ICD-10-CM | POA: Diagnosis not present

## 2015-08-30 DIAGNOSIS — F411 Generalized anxiety disorder: Secondary | ICD-10-CM | POA: Diagnosis not present

## 2015-08-30 DIAGNOSIS — F3181 Bipolar II disorder: Secondary | ICD-10-CM | POA: Diagnosis not present

## 2015-09-05 DIAGNOSIS — J301 Allergic rhinitis due to pollen: Secondary | ICD-10-CM | POA: Diagnosis not present

## 2015-09-10 DIAGNOSIS — J301 Allergic rhinitis due to pollen: Secondary | ICD-10-CM | POA: Diagnosis not present

## 2015-09-10 DIAGNOSIS — S0086XA Insect bite (nonvenomous) of other part of head, initial encounter: Secondary | ICD-10-CM | POA: Diagnosis not present

## 2015-09-26 DIAGNOSIS — J301 Allergic rhinitis due to pollen: Secondary | ICD-10-CM | POA: Diagnosis not present

## 2015-10-10 DIAGNOSIS — J301 Allergic rhinitis due to pollen: Secondary | ICD-10-CM | POA: Diagnosis not present

## 2015-10-17 DIAGNOSIS — J301 Allergic rhinitis due to pollen: Secondary | ICD-10-CM | POA: Diagnosis not present

## 2015-11-15 ENCOUNTER — Telehealth: Payer: Self-pay | Admitting: *Deleted

## 2015-11-15 DIAGNOSIS — N898 Other specified noninflammatory disorders of vagina: Secondary | ICD-10-CM

## 2015-11-15 MED ORDER — FLUCONAZOLE 150 MG PO TABS: 150.0000 mg | ORAL_TABLET | Freq: Once | ORAL | 1 refills | Status: AC

## 2015-11-15 NOTE — Telephone Encounter (Signed)
Pt requested to have a antibiotic for a yeast infection. Pt stated that she gets yeast infection periodically.  Pt contact [phone] [facility] [place]

## 2015-11-15 NOTE — Telephone Encounter (Signed)
Does patient need to be seen due to HX? Please advise.

## 2015-11-15 NOTE — Telephone Encounter (Signed)
No, she margaret in June for same. Only if symptoms do not resolve.

## 2015-11-15 NOTE — Telephone Encounter (Signed)
Left message for patient to return call back.  

## 2015-11-19 NOTE — Telephone Encounter (Signed)
Pharmacy stated she picked medication up.

## 2015-11-21 DIAGNOSIS — J301 Allergic rhinitis due to pollen: Secondary | ICD-10-CM | POA: Diagnosis not present

## 2015-11-25 DIAGNOSIS — J301 Allergic rhinitis due to pollen: Secondary | ICD-10-CM | POA: Diagnosis not present

## 2016-01-09 DIAGNOSIS — J301 Allergic rhinitis due to pollen: Secondary | ICD-10-CM | POA: Diagnosis not present

## 2016-01-16 DIAGNOSIS — J301 Allergic rhinitis due to pollen: Secondary | ICD-10-CM | POA: Diagnosis not present

## 2016-02-06 DIAGNOSIS — J301 Allergic rhinitis due to pollen: Secondary | ICD-10-CM | POA: Diagnosis not present

## 2016-02-13 DIAGNOSIS — J301 Allergic rhinitis due to pollen: Secondary | ICD-10-CM | POA: Diagnosis not present

## 2016-02-17 DIAGNOSIS — Z0184 Encounter for antibody response examination: Secondary | ICD-10-CM | POA: Diagnosis not present

## 2016-02-17 DIAGNOSIS — J301 Allergic rhinitis due to pollen: Secondary | ICD-10-CM | POA: Diagnosis not present

## 2016-02-20 DIAGNOSIS — J301 Allergic rhinitis due to pollen: Secondary | ICD-10-CM | POA: Diagnosis not present

## 2016-02-27 DIAGNOSIS — J301 Allergic rhinitis due to pollen: Secondary | ICD-10-CM | POA: Diagnosis not present

## 2016-03-02 DIAGNOSIS — J301 Allergic rhinitis due to pollen: Secondary | ICD-10-CM | POA: Diagnosis not present

## 2016-03-19 DIAGNOSIS — J301 Allergic rhinitis due to pollen: Secondary | ICD-10-CM | POA: Diagnosis not present

## 2016-05-07 DIAGNOSIS — J301 Allergic rhinitis due to pollen: Secondary | ICD-10-CM | POA: Diagnosis not present

## 2016-05-08 DIAGNOSIS — J301 Allergic rhinitis due to pollen: Secondary | ICD-10-CM | POA: Diagnosis not present

## 2016-05-14 DIAGNOSIS — J301 Allergic rhinitis due to pollen: Secondary | ICD-10-CM | POA: Diagnosis not present

## 2016-05-21 DIAGNOSIS — J301 Allergic rhinitis due to pollen: Secondary | ICD-10-CM | POA: Diagnosis not present

## 2016-05-26 DIAGNOSIS — F411 Generalized anxiety disorder: Secondary | ICD-10-CM | POA: Diagnosis not present

## 2016-05-26 DIAGNOSIS — F3181 Bipolar II disorder: Secondary | ICD-10-CM | POA: Diagnosis not present

## 2016-05-28 DIAGNOSIS — D2271 Melanocytic nevi of right lower limb, including hip: Secondary | ICD-10-CM | POA: Diagnosis not present

## 2016-05-28 DIAGNOSIS — L718 Other rosacea: Secondary | ICD-10-CM | POA: Diagnosis not present

## 2016-05-28 DIAGNOSIS — D485 Neoplasm of uncertain behavior of skin: Secondary | ICD-10-CM | POA: Diagnosis not present

## 2016-05-28 DIAGNOSIS — Z86018 Personal history of other benign neoplasm: Secondary | ICD-10-CM | POA: Diagnosis not present

## 2016-06-04 DIAGNOSIS — J301 Allergic rhinitis due to pollen: Secondary | ICD-10-CM | POA: Diagnosis not present

## 2016-06-05 DIAGNOSIS — J301 Allergic rhinitis due to pollen: Secondary | ICD-10-CM | POA: Diagnosis not present

## 2016-06-11 DIAGNOSIS — J301 Allergic rhinitis due to pollen: Secondary | ICD-10-CM | POA: Diagnosis not present

## 2016-06-18 DIAGNOSIS — J301 Allergic rhinitis due to pollen: Secondary | ICD-10-CM | POA: Diagnosis not present

## 2016-06-23 ENCOUNTER — Ambulatory Visit (INDEPENDENT_AMBULATORY_CARE_PROVIDER_SITE_OTHER): Payer: BLUE CROSS/BLUE SHIELD | Admitting: Obstetrics and Gynecology

## 2016-06-23 ENCOUNTER — Encounter: Payer: Self-pay | Admitting: Obstetrics and Gynecology

## 2016-06-23 VITALS — BP 118/72 | Ht 62.0 in | Wt 145.0 lb

## 2016-06-23 DIAGNOSIS — Z01419 Encounter for gynecological examination (general) (routine) without abnormal findings: Secondary | ICD-10-CM | POA: Diagnosis not present

## 2016-06-23 DIAGNOSIS — Z1389 Encounter for screening for other disorder: Secondary | ICD-10-CM

## 2016-06-23 DIAGNOSIS — Z124 Encounter for screening for malignant neoplasm of cervix: Secondary | ICD-10-CM

## 2016-06-23 DIAGNOSIS — Z1331 Encounter for screening for depression: Secondary | ICD-10-CM

## 2016-06-23 DIAGNOSIS — Z1339 Encounter for screening examination for other mental health and behavioral disorders: Secondary | ICD-10-CM

## 2016-06-23 NOTE — Progress Notes (Signed)
Gynecology Annual Exam  PCP: [name], MD  Chief Complaint  Patient presents with  . Annual Exam    History of Present Illness:  [name] is a 46 y.o. [id number] who LMP was Patient's last menstrual period was 05/05/2016., presents today for her annual examination.  Her menses are rare due to her IUD (Mirena)  She does not have vasomotor sx.   She is single partner, contraception - IUD. She does have vaginal dryness.  Last Pap: 2 years ago, NILM, HPV negative Hx of STDs: none  Last mammogram: never had There is no FH of breast cancer. There is no FH of ovarian cancer. The patient does not do self-breast exams.  Colonoscopy: not due DEXA: has not been screened for osteoporosis  Tobacco use: The patient denies current or previous tobacco use. Alcohol use: social drinker Exercise: moderately active  The patient wears seatbelts: yes.     Past Medical History:  Diagnosis Date  . Anxiety   . Enchondroma, multiple   . GERD (gastroesophageal reflux disease)   . IUD    [facility]  Dr Kennon Rounds  . Migraine headache    sinus related  . Motion sickness    cars - long trips  . MVP (mitral valve prolapse)    Saw cardiology for syncope 7/16.  Marland Kitchen PONV (postoperative nausea and vomiting)   . Pyloric stenosis infant   Inguinal Hernia repair  . Screening for cervical cancer April 2012   normal PAP   . Wears contact lenses     Past Surgical History:  Procedure Laterality Date  . 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 CESAREAN SECTION     2003 and 2004  . HERNIA REPAIR  1976  . IUD REMOVAL N/A 05/17/2013   Procedure: INTRAUTERINE DEVICE (IUD) REMOVAL;  Surgeon: [name], MD;  Location: [facility];  Service: Gynecology;  Laterality: N/A;  Replacement of IUD  . PYLOROPLASTY  1974  . TOE SURGERY Left 2004   Enchondroma removal - great toe  . TONSILLECTOMY Bilateral 05/16/2015    Procedure: TONSILLECTOMY;  Surgeon: Margaretha Sheffield, MD;  Location: [facility];  Service: ENT;  Laterality: Bilateral;  Latex sensitivity    Prior to Admission medications   Medication Sig Start Date End Date Taking? Authorizing Provider  ketotifen (ZADITOR) 0.025 % ophthalmic solution Place 1 drop into both eyes 2 (two) times daily.     Yes [provider]  lamoTRIgine (LAMICTAL) 150 MG tablet Take 150 mg by mouth daily.   Yes [provider]  levonorgestrel (MIRENA) 20 MCG/24HR IUD 1 each by Intrauterine route once.   Yes [provider]  omeprazole (PRILOSEC) 20 MG capsule TAKE ONE CAPSULE BY MOUTH TWICE A DAY 06/18/15  Yes [name], MD  Probiotic Product (PROBIOTIC FORMULA PO) Take 1 capsule by mouth 2 (two) times daily.    Yes [provider]  traZODone (DESYREL) 150 MG tablet Take 150 mg by mouth at bedtime.   Yes [provider]  triamcinolone (NASACORT ALLERGY 24HR) 55 MCG/ACT AERO nasal inhaler Place 2 sprays into the nose daily.   Yes [provider]  metroNIDAZOLE (FLAGYL) 500 MG tablet Take 1 tablet (500 mg total) by mouth 2 (two) times daily. Patient not taking: Reported on 06/23/2016 06/19/15   Burnard Hawthorne, FNP  metroNIDAZOLE (METROGEL) 0.75 % gel Apply 1 application topically 2 (two)  times daily.    [provider]  NON FORMULARY Inject 2 each into the muscle once a week. Allergy shots from allergist    [provider]    Allergies  Allergen Reactions  . Codeine Sulfate Itching and Nausea And Vomiting    Says all narcotics cause itching and she tries to avoid  . Sulfa Antibiotics Hives    Hives and fever at age of 16, has not had since then  . Tape Itching    whelts  . Latex Rash    Not severe, but avoids gloves  condoms    Gynecologic History:  Patient's last menstrual period was 05/05/2016. Contraception: IUD  Obstetric History: [id number], s/p C-section x 2  Family History   Problem Relation Age of Onset  . Cancer Father        adenocarcinoma, nonsmoker  . COPD Mother   . Diabetes Mother   . Hypertension Mother   . Cancer Maternal Grandfather        lung  . BRCA 1/2 Paternal Grandmother   . Cancer Paternal Grandfather        lung    Social History   Social History  . Marital status: Married    Spouse name: N/A  . Number of children: N/A  . Years of education: N/A   Occupational History  . 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 Smoking status: Never Smoker  . Smokeless tobacco: Never Used  . Alcohol use 1.8 oz/week    3 Shots of liquor per week     Comment:    . Drug use: No  . Sexual activity: Yes    Birth control/ protection: IUD   Other Topics Concern  . Not on file   Social History Narrative  . No narrative on file    Review of Systems  Constitutional: Negative.   HENT: Negative.   Eyes: Negative.   Respiratory: Negative.   Cardiovascular: Negative.   Gastrointestinal: Negative.   Genitourinary: Negative.   Musculoskeletal: Negative.   Skin: Negative.   Neurological: Negative.   Psychiatric/Behavioral: Negative.      Physical Exam BP 118/72   Ht _0  (1.575 m)   Wt 145 lb (65.8 kg)   LMP 05/05/2016   BMI 26.52 kg/m   Physical Exam  Constitutional: She is oriented to person, place, and time. She appears well-developed and well-nourished. No distress.  Genitourinary: Vagina normal and uterus normal. Pelvic exam was performed with patient supine. There is no rash, tenderness, lesion or injury on the right labia. There is no rash, lesion or injury on the left labia. Vagina exhibits no lesion. No bleeding in the vagina. No signs of injury around the vagina. Right adnexum does not display mass, does not display tenderness and does not display fullness. Left adnexum does not display mass, does not display tenderness and does not display fullness.  Cervix exhibits visible IUD  strings. Cervix does not exhibit motion tenderness, lesion, discharge, polyp or nabothian cyst.   Uterus is mobile and anteverted. Uterus is not enlarged, tender, exhibiting a mass or irregular (is regular).  HENT:  Head: Normocephalic and atraumatic.  Eyes: EOM are normal. No scleral icterus.  Neck: Normal range of motion. Neck supple. No thyromegaly present.  Cardiovascular: Normal rate and regular rhythm.  Exam reveals no gallop and no friction rub.   No murmur heard. Pulmonary/Chest: Effort normal and breath sounds normal. No respiratory distress. She  has no wheezes. She has no rales.  Abdominal: Soft. Bowel sounds are normal. She exhibits no distension and no mass. There is no tenderness. There is no rebound and no guarding.  Musculoskeletal: Normal range of motion. She exhibits no edema.  Lymphadenopathy:    She has no cervical adenopathy.  Neurological: She is alert and oriented to person, place, and time. No cranial nerve deficit.  Skin: Skin is warm and dry. No erythema.  Psychiatric: She has a normal mood and affect. Her behavior is normal. Judgment normal.    Female chaperone present for pelvic and breast  portions of the physical exam  Results: AUDIT Questionnaire (screen for alcoholism): 4 PHQ-9: 9  Assessment: 46 y.o. [id number] female here for routine gynecologic examination.  Plan: Problem List Items Addressed This Visit    None      Screening: -- Blood pressure screen normal -- Colonoscopy - not due -- Mammogram - due. Patient to call [facility] to arrange. She understands that it is her responsibility to arrange this. -- Weight screening: normal -- Depression screening positive (PHQ-9): patient treated elsewhere. States she is already in contact with this provider and starting her medication again. She has no acute concerns today.  -- Nutrition: normal -- cholesterol screening: not due for screening -- osteoporosis screening: not due -- tobacco screening: not  using -- alcohol screening: AUDIT questionnaire indicates low-risk usage. -- family history of breast cancer screening: done. not at high risk. -- no evidence of domestic violence or intimate partner violence. -- STD screening: gonorrhea/chlamydia NAAT not collected per patient request. -- pap smear collected per ASCCP guidelines -- HPV vaccination series: not eligilbe  Prentice Docker, MD 06/23/2016 3:19 PM

## 2016-06-24 ENCOUNTER — Telehealth: Payer: Self-pay | Admitting: Internal Medicine

## 2016-06-24 DIAGNOSIS — Z01419 Encounter for gynecological examination (general) (routine) without abnormal findings: Secondary | ICD-10-CM | POA: Diagnosis not present

## 2016-06-24 DIAGNOSIS — R87610 Atypical squamous cells of undetermined significance on cytologic smear of cervix (ASC-US): Secondary | ICD-10-CM | POA: Diagnosis not present

## 2016-06-24 DIAGNOSIS — Z124 Encounter for screening for malignant neoplasm of cervix: Secondary | ICD-10-CM | POA: Diagnosis not present

## 2016-06-24 NOTE — Telephone Encounter (Signed)
Spoke with Carolyn Clements and informed her that Dr. Darrick Huntsmanullo would like to see her prior to sending her somewhere since she doesn't have a history of celiac's disease. The Carolyn Clements stated that she will schedule an appt but that she could not do while on the phone at that time.

## 2016-06-24 NOTE — Telephone Encounter (Signed)
Pt called and wanted to know if Dr. Darrick Huntsmanullo would send her for celiac's. Please advise, thank you!  Call pt @ [phone]

## 2016-06-24 NOTE — Telephone Encounter (Signed)
Please advise 

## 2016-06-24 NOTE — Telephone Encounter (Signed)
Needs to be seen,.  Message is unclear a,patient has no history of celiac

## 2016-06-26 DIAGNOSIS — F3181 Bipolar II disorder: Secondary | ICD-10-CM | POA: Diagnosis not present

## 2016-06-26 DIAGNOSIS — F411 Generalized anxiety disorder: Secondary | ICD-10-CM | POA: Diagnosis not present

## 2016-06-27 LAB — IGP, APTIMA HPV, RFX 16/18,45
HPV APTIMA: NEGATIVE
PAP Smear Comment: 0

## 2016-07-02 DIAGNOSIS — J301 Allergic rhinitis due to pollen: Secondary | ICD-10-CM | POA: Diagnosis not present

## 2016-07-07 ENCOUNTER — Encounter: Payer: Self-pay | Admitting: Obstetrics and Gynecology

## 2016-07-09 DIAGNOSIS — J301 Allergic rhinitis due to pollen: Secondary | ICD-10-CM | POA: Diagnosis not present

## 2016-07-30 DIAGNOSIS — F411 Generalized anxiety disorder: Secondary | ICD-10-CM | POA: Diagnosis not present

## 2016-07-30 DIAGNOSIS — F5105 Insomnia due to other mental disorder: Secondary | ICD-10-CM | POA: Diagnosis not present

## 2016-07-30 DIAGNOSIS — F3181 Bipolar II disorder: Secondary | ICD-10-CM | POA: Diagnosis not present

## 2016-07-31 DIAGNOSIS — J301 Allergic rhinitis due to pollen: Secondary | ICD-10-CM | POA: Diagnosis not present

## 2016-08-27 DIAGNOSIS — F3181 Bipolar II disorder: Secondary | ICD-10-CM | POA: Diagnosis not present

## 2016-08-27 DIAGNOSIS — F411 Generalized anxiety disorder: Secondary | ICD-10-CM | POA: Diagnosis not present

## 2016-08-27 DIAGNOSIS — F5105 Insomnia due to other mental disorder: Secondary | ICD-10-CM | POA: Diagnosis not present

## 2016-09-09 DIAGNOSIS — S86812A Strain of other muscle(s) and tendon(s) at lower leg level, left leg, initial encounter: Secondary | ICD-10-CM | POA: Diagnosis not present

## 2016-09-09 DIAGNOSIS — M25369 Other instability, unspecified knee: Secondary | ICD-10-CM | POA: Diagnosis not present

## 2016-09-14 DIAGNOSIS — F3181 Bipolar II disorder: Secondary | ICD-10-CM | POA: Diagnosis not present

## 2016-09-14 LAB — CBC AND DIFFERENTIAL
HEMATOCRIT: 38 (ref 36–46)
HEMOGLOBIN: 12.7 (ref 12.0–16.0)
Neutrophils Absolute: 3
Platelets: 163 (ref 150–399)
WBC: 5.3

## 2016-09-14 LAB — BASIC METABOLIC PANEL
BUN: 28 — AB (ref 4–21)
Creatinine: 1 (ref 0.5–1.1)
Glucose: 116
POTASSIUM: 4 (ref 3.4–5.3)
SODIUM: 139 (ref 137–147)

## 2016-09-14 LAB — HEPATIC FUNCTION PANEL
ALT: 9 (ref 7–35)
AST: 21 (ref 13–35)
Alkaline Phosphatase: 61 (ref 25–125)
Bilirubin, Total: 0.3

## 2016-09-21 DIAGNOSIS — M25369 Other instability, unspecified knee: Secondary | ICD-10-CM | POA: Diagnosis not present

## 2016-10-02 ENCOUNTER — Telehealth: Payer: Self-pay | Admitting: Internal Medicine

## 2016-10-02 NOTE — Telephone Encounter (Signed)
I received labs from Dr Shelda Altes office: CBC CMET , thyroid, B12 and folate .  However,  Patient has not been seen since Nov 2015 so she is very close to being phased out as my patient .  Labs are all normal.   Please offer her an appointment before the end of November to reestablish care.   after that , I cannot guarantee I will be able to see her.

## 2016-10-02 NOTE — Telephone Encounter (Signed)
Spoke with pt and informed him of his lab results. The pt gave a verbal understanding. Also scheduled the pt an appt for November. Pt is aware of appt date and time.

## 2016-11-13 ENCOUNTER — Ambulatory Visit: Payer: BLUE CROSS/BLUE SHIELD | Admitting: Internal Medicine

## 2016-11-19 DIAGNOSIS — F411 Generalized anxiety disorder: Secondary | ICD-10-CM | POA: Diagnosis not present

## 2016-11-19 DIAGNOSIS — F5105 Insomnia due to other mental disorder: Secondary | ICD-10-CM | POA: Diagnosis not present

## 2016-11-19 DIAGNOSIS — F3181 Bipolar II disorder: Secondary | ICD-10-CM | POA: Diagnosis not present

## 2016-11-24 ENCOUNTER — Ambulatory Visit: Payer: BLUE CROSS/BLUE SHIELD | Admitting: Internal Medicine

## 2016-11-24 ENCOUNTER — Encounter: Payer: Self-pay | Admitting: Internal Medicine

## 2016-11-24 DIAGNOSIS — F411 Generalized anxiety disorder: Secondary | ICD-10-CM | POA: Diagnosis not present

## 2016-11-24 NOTE — Progress Notes (Signed)
Subjective:  Patient ID: Carolyn Clements, female    DOB: Oct 03, 1970  Age: 46 y.o. MRN: [id number]  CC: The encounter diagnosis was Generalized anxiety disorder.  HPI Carolyn Clements presents for reestablishment of care.    Seeing E=Bowers at [facility] for strain of knee Seeing Dr Nicolasa Ducking for insomnia related to anxiety aggravated by the health of her children,  Her 29 yr old has had a major personality change 1.5 years ago with onset of chronic migraines  And suicidality . Has missed a lot of school due to migraines,  Very bright   kid unsympathetic teachers.  Undergoing a full psych and neurology evaluation  . The other child has "breathing issues"  Probably vocal cord dysfunction and is getting speech therapy and pulmonary follow up. Had severe GERD until ag e 14,  Now 17 and wanting to major in music/voice. Has been chronically overweight and presyncopal with exertion .       Had a tonsillectomy 2 years ago. Sleep study positive  with hypoxia,  Resolved with tonsillectomy , uvula reduction  Juengel.  Sleep improved somewhat  , then hydroxyzine followed by gabapentin with imporved REM states,  Better sleep.    Had abnormal PAP in June.  Repeat in one year;  mammogram   Cardiac workup for syncope and hypotension and hypersomnia.    Labs from Sept reviewed,  All normal/nonfasting    History Carolyn Clements has a past medical history of Anxiety, Enchondroma, multiple, GERD (gastroesophageal reflux disease), IUD, Migraine headache, Motion sickness, MVP (mitral valve prolapse), PONV (postoperative nausea and vomiting), Pyloric stenosis (infant), Screening for cervical cancer (April 2012), and Wears contact lenses.   She has a past surgical history that includes Arthroscopic repair ACL; Cesarean section; Hernia repair (1976); Pyloroplasty (1974); IUD removal (N/A, 05/17/2013); Toe Surgery (Left, 2004); and Tonsillectomy (Bilateral, 05/16/2015).   Her family history includes BRCA 1/2 in her paternal  grandmother; COPD in her mother; Cancer in her father, maternal grandfather, and paternal grandfather; Diabetes in her mother; Hypertension in her mother.She reports that  has never smoked. she has never used smokeless tobacco. She reports that she drinks about 1.8 oz of alcohol per week. She reports that she does not use drugs.  Outpatient Medications Prior to Visit  Medication Sig Dispense Refill  . gabapentin (NEURONTIN) 100 MG capsule TK 1 C PO HS  0  . ketotifen (ZADITOR) 0.025 % ophthalmic solution Place 1 drop into both eyes 2 (two) times daily.      Marland Kitchen lamoTRIgine (LAMICTAL) 200 MG tablet TK 1 T PO D  0  . levonorgestrel (MIRENA) 20 MCG/24HR IUD 1 each by Intrauterine route once.    . metroNIDAZOLE (METROGEL) 0.75 % gel Apply 1 application topically 2 (two) times daily.    Marland Kitchen omeprazole (PRILOSEC) 20 MG capsule TAKE ONE CAPSULE BY MOUTH TWICE A DAY 60 capsule 5  . Probiotic Product (PROBIOTIC FORMULA PO) Take 1 capsule by mouth 2 (two) times daily.     . traZODone (DESYREL) 150 MG tablet Take 150 mg by mouth at bedtime.     . triamcinolone (NASACORT ALLERGY 24HR) 55 MCG/ACT AERO nasal inhaler Place 2 sprays into the nose daily.    Marland Kitchen lamoTRIgine (LAMICTAL) 150 MG tablet Take 150 mg by mouth daily.     . NON FORMULARY Inject 2 each into the muscle once a week. Allergy shots from allergist     No facility-administered medications prior to visit.     Review of  Systems:  Patient denies headache, fevers, malaise, unintentional weight loss, skin rash, eye pain, sinus congestion and sinus pain, sore throat, dysphagia,  hemoptysis , cough, dyspnea, wheezing, chest pain, palpitations, orthopnea, edema, abdominal pain, nausea, melena, diarrhea, constipation, flank pain, dysuria, hematuria, urinary  Frequency, nocturia, numbness, tingling, seizures,  Focal weakness, Loss of consciousness,  Tremor, insomnia, depression, anxiety, and suicidal ideation.     Objective:  BP 106/62 (BP Location: Left  Arm, Patient Position: Sitting, Cuff Size: Normal)   Pulse 66   Temp 98.1 F (36.7 C) (Oral)   Resp 15   Ht _0  (1.575 m)   Wt 150 lb 6.4 oz (68.2 kg)   SpO2 98%   BMI 27.51 kg/m   Physical Exam:   Assessment & Plan:   Problem List Items Addressed This Visit    Generalized anxiety disorder    Aggravated by the complicated course her daughter's psychological issues have caused. She appears tired, frustrated and has gaine d 15 lbs due to lack of self care. A total of 40 minutes was spent with patient more than half of which was spent in counseling patient on the above mentioned issues , reviewing and explaining recent labs  done, and coordination of care.          I have discontinued Carolyn Clements's NON FORMULARY. I am also having her maintain her Probiotic Product (PROBIOTIC FORMULA PO), ketotifen, traZODone, levonorgestrel, triamcinolone, metroNIDAZOLE, omeprazole, gabapentin, and lamoTRIgine.  No orders of the defined types were placed in this encounter.   Medications Discontinued During This Encounter  Medication Reason  . NON FORMULARY Patient has not taken in last 30 days  . lamoTRIgine (LAMICTAL) 397 MG tablet Duplicate    Follow-up: Return in about 1 year (around 11/24/2017).   [name], MD

## 2016-11-24 NOTE — Patient Instructions (Addendum)
Good to see you again  I reviewed your recent labs and they look fine

## 2016-11-26 ENCOUNTER — Encounter: Payer: Self-pay | Admitting: Internal Medicine

## 2016-11-26 NOTE — Assessment & Plan Note (Addendum)
Aggravated by the complicated course her daughter's psychological issues have caused. She appears tired, frustrated and has gaine d 15 lbs due to lack of self care. A total of 40 minutes was spent with patient more than half of which was spent in counseling patient on the above mentioned issues , reviewing and explaining recent labs  done, and coordination of care.

## 2017-01-22 ENCOUNTER — Encounter: Payer: Self-pay | Admitting: Obstetrics and Gynecology

## 2017-01-22 ENCOUNTER — Ambulatory Visit: Payer: Self-pay | Admitting: *Deleted

## 2017-01-22 ENCOUNTER — Ambulatory Visit: Payer: BLUE CROSS/BLUE SHIELD | Admitting: Obstetrics and Gynecology

## 2017-01-22 VITALS — BP 98/60 | HR 69 | Ht 62.5 in | Wt 151.0 lb

## 2017-01-22 DIAGNOSIS — N760 Acute vaginitis: Secondary | ICD-10-CM | POA: Diagnosis not present

## 2017-01-22 DIAGNOSIS — B9689 Other specified bacterial agents as the cause of diseases classified elsewhere: Secondary | ICD-10-CM | POA: Diagnosis not present

## 2017-01-22 MED ORDER — METRONIDAZOLE 500 MG PO TABS: 500.0000 mg | ORAL_TABLET | Freq: Two times a day (BID) | ORAL | 0 refills | Status: AC

## 2017-01-22 MED ORDER — HYLAFEM VA SUPP: 1.0000 | Freq: Every day | VAGINAL | 0 refills | Status: AC

## 2017-01-22 NOTE — Progress Notes (Signed)
Obstetrics & Gynecology Office Visit   Chief Complaint:  Chief Complaint  Patient presents with  . vaginal irritation    viscus type discharge w/odor    History of Present Illness: [name] is a 47 y.o. [id number] who LMP was No LMP recorded. Patient is not currently having periods (Reason: IUD)., presents today for a problem visit.   Patient complains of an abnormal vaginal discharge for 1 week. Discharge described as: white, thin and malodorous. Vaginal symptoms include none.Vulvar symptoms include none.STI Risk: Very low risk of STD exposure.   Other associated symptoms: none.Menstrual pattern: She denies recent antibiotic exposure, denies changes in soaps, detergents coinciding with the onset of her symptoms.  She has not previously self treated or been under treatment by another provider for these symptoms.   Review of Systems: Review of Systems  Constitutional: Negative for chills and fever.  Gastrointestinal: Negative for abdominal pain.  Genitourinary: Negative for dysuria, frequency and urgency.  Skin: Negative for itching and rash.     Past Medical History:  Past Medical History:  Diagnosis Date  . Anxiety   . Enchondroma, multiple   . GERD (gastroesophageal reflux disease)   . IUD    Center for [facility]' health  Dr Kennon Rounds  . Migraine headache    sinus related  . Motion sickness    cars - long trips  . MVP (mitral valve prolapse)    Saw cardiology for syncope 7/16.  Marland Kitchen PONV (postoperative nausea and vomiting)   . Pyloric stenosis infant   Inguinal Hernia repair  . Screening for cervical cancer April 2012   normal PAP   . Wears contact lenses     Past Surgical History:  Past Surgical History:  Procedure Laterality Date  . 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 CESAREAN SECTION     2003 and 2004  . HERNIA REPAIR  1976  . IUD REMOVAL N/A 05/17/2013   Procedure: INTRAUTERINE DEVICE  (IUD) REMOVAL;  Surgeon: [name], MD;  Location: [facility];  Service: Gynecology;  Laterality: N/A;  Replacement of IUD  . PYLOROPLASTY  1974  . TOE SURGERY Left 2004   Enchondroma removal - great toe  . TONSILLECTOMY Bilateral 05/16/2015   Procedure: TONSILLECTOMY;  Surgeon: Margaretha Sheffield, MD;  Location: [place];  Service: ENT;  Laterality: Bilateral;  Latex sensitivity    Gynecologic History: No LMP recorded. Patient is not currently having periods (Reason: IUD).  Obstetric History: [id number]  Family History:  Family History  Problem Relation Age of Onset  . Cancer Father        adenocarcinoma, nonsmoker  . COPD Mother   . Diabetes Mother   . Hypertension Mother   . Cancer Maternal Grandfather        lung  . BRCA 1/2 Paternal Grandmother   . Cancer Paternal Grandfather        lung    Social History:  Social History   Socioeconomic History  . Marital status: Married    Spouse name: Not on file  . Number of children: Not on file  . Years of education: Not on file  . Highest education level: Not on file  Social Needs  . Financial resource strain: Not on file  . Food insecurity - worry: Not on file  . Food insecurity - inability: Not on file  . Transportation needs - medical: Not on file  .  Transportation needs - non-medical: Not on file  Occupational History  . Occupation: physical therapist    Employer: [employer]    Comment: Physical Therapist  Tobacco Use  . Smoking status: Never Smoker  . Smokeless tobacco: Never Used  Substance and Sexual Activity  . Alcohol use: Yes    Alcohol/week: 1.8 oz    Types: 3 Shots of liquor per week    Comment:    . Drug use: No  . Sexual activity: Yes    Birth control/protection: IUD  Other Topics Concern  . Not on file  Social History Narrative  . Not on file    Allergies:  Allergies  Allergen Reactions  . Codeine Sulfate Itching and Nausea And Vomiting    Says all narcotics cause itching and she tries to  avoid  . Sulfa Antibiotics Hives    Hives and fever at age of 34, has not had since then  . Tape Itching    whelts  . Latex Rash    Not severe, but avoids gloves  condoms    Medications: Prior to Admission medications   Medication Sig Start Date End Date Taking? Authorizing Provider  gabapentin (NEURONTIN) 100 MG capsule TK 1 C PO HS 11/19/16  Yes [provider]  ketotifen (ZADITOR) 0.025 % ophthalmic solution Place 1 drop into both eyes 2 (two) times daily.     Yes [provider]  lamoTRIgine (LAMICTAL) 200 MG tablet TK 1 T PO D 11/19/16  Yes [provider]  levonorgestrel (MIRENA) 20 MCG/24HR IUD 1 each by Intrauterine route once.   Yes [provider]  metroNIDAZOLE (METROGEL) 0.75 % gel Apply 1 application topically 2 (two) times daily.   Yes [provider]  omeprazole (PRILOSEC) 20 MG capsule TAKE ONE CAPSULE BY MOUTH TWICE A DAY 06/18/15  Yes [name], MD  Probiotic Product (PROBIOTIC FORMULA PO) Take 1 capsule by mouth 2 (two) times daily.    Yes [provider]  traZODone (DESYREL) 150 MG tablet Take 150 mg by mouth at bedtime.    Yes [provider]  triamcinolone (NASACORT ALLERGY 24HR) 55 MCG/ACT AERO nasal inhaler Place 2 sprays into the nose daily.   Yes [provider]  Homeopathic Products (HYLAFEM) SUPP Place 1 suppository vaginally at bedtime for 3 days. 01/22/17 01/25/17  Malachy Mood, MD  metroNIDAZOLE (FLAGYL) 500 MG tablet Take 1 tablet (500 mg total) by mouth 2 (two) times daily for 7 days. 01/22/17 01/29/17  Malachy Mood, MD    Physical Exam Vitals:  Vitals:   01/22/17 1559  BP: 98/60  Pulse: 69   No LMP recorded. Patient is not currently having periods (Reason: IUD).  General: NAD HEENT: normocephalic, anicteric Pulmonary: No increased work of breathing Cardiovascular: RRR, distal pulses 2+ Abdomen: NABS, soft, non-tender, non-distended.  Umbilicus without lesions.  No  hepatomegaly, splenomegaly or masses palpable. No evidence of hernia  Genitourinary:  External: Normal external female genitalia.  Normal urethral meatus, normal Bartholin's and Skene's glands.    Rectal: deferred  Lymphatic: no evidence of inguinal lymphadenopathy Extremities: no edema, erythema, or tenderness Neurologic: Grossly intact Psychiatric: mood appropriate, affect full  Female chaperone present for pelvic portions of the physical exam  Wet Prep: PH: >4.5 Clue Cells: Positive Fungal elements: Negative Trichomonas: Negative   Assessment: 47 y.o. A4Z6606 with bacterial vaginosis  Plan: Problem List Items Addressed This Visit    None     - rx flagyl and hylafem - Discussed vaginal  flora/microbiome and predisposing risk factors for bacterial vaginosis.  We discussed treatment of bacterial vaginosis and use of probiotics to re-introduce lactobacillus.  We also discussed that some IUD users appear to have a slightly higher rate of BV - A total of 15 minutes were spent in face-to-face contact with the patient during this encounter with over half of that time devoted to counseling and coordination of care.  - Return if symptoms worsen or fail to improve.

## 2017-01-22 NOTE — Telephone Encounter (Signed)
Pt reports vaginal discharge x 1 week, small amounts.  Discharge was clear, now "milky with faint odor." Denies itching, rash, no lesions, no fever. No abdominal discomfort. States has had in past "over the years" and has been "bacterial."  Appt made with J.Kordsmeier, PA for Thursday 01/28/17 as pt declined being seen at other practice. Care advice given and instructed to call back if symptoms worsen, fever, pain presents. Reason for Disposition . Bad smelling vaginal discharge  Answer Assessment - Initial Assessment Questions 1. DISCHARGE: "Describe the discharge." (e.g., white, yellow, green, gray, foamy, cottage cheese-like)     yellowish 2. ODOR: "Is there a bad odor?"     "Somewhat" 3. ONSET: "When did the discharge begin?"     1 week ago 4. RASH: "Is there a rash in that area?" If so, ask: "Describe it." (e.g., redness, blisters, sores, bumps)    no 5. ABDOMINAL PAIN: "Are you having any abdominal pain?" If yes: "What does it feel like? " (e.g., crampy, dull, intermittent, constant)      no 6. ABDOMINAL PAIN SEVERITY: If present, ask: "How bad is it?"  (e.g., mild, moderate, severe)  - MILD - doesn't interfere with normal activities   - MODERATE - interferes with normal activities or awakens from sleep   - SEVERE - patient doesn't want to move (R/O peritonitis)      no 7. CAUSE: "What do you think is causing the discharge?" "Have you had the same problem before? What happened then?"     Few times over last few years. "Was bacterial infection." 8. OTHER SYMPTOMS: "Do you have any other symptoms?" (e.g., fever, itching, vaginal bleeding, pain with urination, injury to genital area, vaginal foreign body)     no 9. PREGNANCY: "Is there any chance you are pregnant?" "When was your last menstrual period?"     no  Protocols used: VAGINAL DISCHARGE-A-AH

## 2017-01-28 ENCOUNTER — Ambulatory Visit: Payer: BLUE CROSS/BLUE SHIELD | Admitting: Family Medicine

## 2017-01-28 DIAGNOSIS — F411 Generalized anxiety disorder: Secondary | ICD-10-CM | POA: Diagnosis not present

## 2017-01-28 DIAGNOSIS — Z0289 Encounter for other administrative examinations: Secondary | ICD-10-CM

## 2017-01-28 DIAGNOSIS — F5105 Insomnia due to other mental disorder: Secondary | ICD-10-CM | POA: Diagnosis not present

## 2017-01-28 DIAGNOSIS — F3181 Bipolar II disorder: Secondary | ICD-10-CM | POA: Diagnosis not present

## 2017-01-29 DIAGNOSIS — H1089 Other conjunctivitis: Secondary | ICD-10-CM | POA: Diagnosis not present

## 2017-05-04 DIAGNOSIS — F3181 Bipolar II disorder: Secondary | ICD-10-CM | POA: Diagnosis not present

## 2017-05-04 DIAGNOSIS — F5105 Insomnia due to other mental disorder: Secondary | ICD-10-CM | POA: Diagnosis not present

## 2017-05-04 DIAGNOSIS — F411 Generalized anxiety disorder: Secondary | ICD-10-CM | POA: Diagnosis not present

## 2017-08-10 DIAGNOSIS — F411 Generalized anxiety disorder: Secondary | ICD-10-CM | POA: Diagnosis not present

## 2017-08-10 DIAGNOSIS — F3181 Bipolar II disorder: Secondary | ICD-10-CM | POA: Diagnosis not present

## 2017-08-10 DIAGNOSIS — F5105 Insomnia due to other mental disorder: Secondary | ICD-10-CM | POA: Diagnosis not present

## 2017-10-26 DIAGNOSIS — L718 Other rosacea: Secondary | ICD-10-CM | POA: Diagnosis not present

## 2018-02-17 DIAGNOSIS — M545 Low back pain: Secondary | ICD-10-CM | POA: Diagnosis not present

## 2018-02-26 DIAGNOSIS — R55 Syncope and collapse: Secondary | ICD-10-CM | POA: Diagnosis not present

## 2018-02-26 DIAGNOSIS — S0101XA Laceration without foreign body of scalp, initial encounter: Secondary | ICD-10-CM | POA: Diagnosis not present

## 2018-02-28 ENCOUNTER — Other Ambulatory Visit: Payer: Self-pay

## 2018-02-28 ENCOUNTER — Encounter: Payer: Self-pay | Admitting: Emergency Medicine

## 2018-02-28 ENCOUNTER — Ambulatory Visit
Admission: EM | Admit: 2018-02-28 | Discharge: 2018-02-28 | Disposition: A | Discharge status: Home / Self Care | Payer: BLUE CROSS/BLUE SHIELD | Attending: Family Medicine | Admitting: Family Medicine

## 2018-02-28 DIAGNOSIS — Z5189 Encounter for other specified aftercare: Secondary | ICD-10-CM | POA: Diagnosis not present

## 2018-02-28 NOTE — ED Provider Notes (Signed)
[facility]    CSN: [id number] Arrival date & time: 02/28/18  6237     History   Chief Complaint Chief Complaint  Patient presents with  . Wound Check    HPI Carolyn Clements is a 48 y.o. female.   48 yo female here for wound recheck. Patient states she had a syncopal episode on Saturday and was seen in ED out of town. States she had a scalp laceration and had 16 staples placed. States she feels well, however just wanted to have the wound rechecked and make sure she can shower.   The history is provided by the patient.  Wound Check     Past Medical History:  Diagnosis Date  . Anxiety   . Enchondroma, multiple   . GERD (gastroesophageal reflux disease)   . IUD    [facility] for [facility]' health  Dr Kennon Rounds  . Migraine headache    sinus related  . Motion sickness    cars - long trips  . MVP (mitral valve prolapse)    Saw cardiology for syncope 7/16.  Marland Kitchen PONV (postoperative nausea and vomiting)   . Pyloric stenosis infant   Inguinal Hernia repair  . Screening for cervical cancer April 2012   normal PAP   . Wears contact lenses     Patient Active Problem List   Diagnosis Date Noted  . Hemorrhoids 12/20/2014  . Nausea 12/20/2014  . Congenital enchondromatosis 07/03/2014  . Fatigue 11/07/2013  . Anemia, iron deficiency 11/22/2012  . Generalized anxiety disorder 01/07/2012  . GERD (gastroesophageal reflux disease)   . Migraine headache     Past Surgical History:  Procedure Laterality Date  . 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 CESAREAN SECTION     2003 and 2004  . HERNIA REPAIR  1976  . IUD REMOVAL N/A 05/17/2013   Procedure: INTRAUTERINE DEVICE (IUD) REMOVAL;  Surgeon: [name], MD;  Location: [facility];  Service: Gynecology;  Laterality: N/A;  Replacement of IUD  . PYLOROPLASTY  1974  . TOE SURGERY Left 2004   Enchondroma removal - great toe  . TONSILLECTOMY Bilateral 05/16/2015    Procedure: TONSILLECTOMY;  Surgeon: Margaretha Sheffield, MD;  Location: [place];  Service: ENT;  Laterality: Bilateral;  Latex sensitivity    OB History    Gravida  3   Para  2   Term  2   Preterm      AB  1   Living  2     SAB  1   TAB      Ectopic      Multiple      Live Births  2            Home Medications    Prior to Admission medications   Medication Sig Start Date End Date Taking? Authorizing Provider  cephALEXin (KEFLEX) 500 MG capsule Take 500 mg by mouth 4 (four) times daily.   Yes [provider]  gabapentin (NEURONTIN) 100 MG capsule TK 1 C PO HS 11/19/16  Yes [provider]  ketotifen (ZADITOR) 0.025 % ophthalmic solution Place 1 drop into both eyes 2 (two) times daily.     Yes [provider]  lamoTRIgine (LAMICTAL) 200 MG tablet TK 1 T PO D 11/19/16  Yes [provider]  levonorgestrel (MIRENA) 20 MCG/24HR IUD 1 each by Intrauterine route once.   Yes [provider]  omeprazole (PRILOSEC) 20 MG capsule TAKE ONE CAPSULE BY MOUTH TWICE A DAY 06/18/15  Yes [name], MD  Probiotic Product (PROBIOTIC FORMULA PO) Take 1 capsule by mouth 2 (two) times daily.    Yes [provider]  traZODone (DESYREL) 150 MG tablet Take 150 mg by mouth at bedtime.    Yes [provider]  triamcinolone (NASACORT ALLERGY 24HR) 55 MCG/ACT AERO nasal inhaler Place 2 sprays into the nose daily.   Yes [provider]  metroNIDAZOLE (METROGEL) 0.75 % gel Apply 1 application topically 2 (two) times daily.    [provider]    Family History Family History  Problem Relation Age of Onset  . Cancer Father        adenocarcinoma, nonsmoker  . COPD Mother   . Diabetes Mother   . Hypertension Mother   . Cancer Maternal Grandfather        lung  . BRCA 1/2 Paternal Grandmother   . Cancer Paternal Grandfather        lung    Social History Social History   Tobacco Use  . Smoking  status: Never Smoker  . Smokeless tobacco: Never Used  Substance Use Topics  . Alcohol use: Yes    Alcohol/week: 3.0 standard drinks    Types: 3 Shots of liquor per week    Comment:    . Drug use: No     Allergies   Codeine sulfate; Sulfa antibiotics; Tape; and Latex   Review of Systems Review of Systems   Physical Exam Triage Vital Signs ED Triage Vitals  Enc Vitals Group     BP 02/28/18 0957 108/76     Pulse Rate 02/28/18 0957 63     Resp 02/28/18 0957 18     Temp 02/28/18 0957 98.1 F (36.7 C)     Temp Source 02/28/18 0957 Oral     SpO2 02/28/18 0957 98 %     Weight 02/28/18 0954 148 lb (67.1 kg)     Height 02/28/18 0954 5' 2.5" (1.588 m)     Head Circumference --      Peak Flow --      Pain Score 02/28/18 0954 1     Pain Loc --      Pain Edu? --      Excl. in [place]? --    No data found.  Updated Vital Signs BP 108/76 (BP Location: Left Arm)   Pulse 63   Temp 98.1 F (36.7 C) (Oral)   Resp 18   Ht 5' 2.5" (1.588 m)   Wt 67.1 kg   SpO2 98%   BMI 26.64 kg/m   Visual Acuity Right Eye Distance:   Left Eye Distance:   Bilateral Distance:    Right Eye Near:   Left Eye Near:    Bilateral Near:     Physical Exam Vitals signs and nursing note reviewed.  Constitutional:      General: She is not in acute distress.    Appearance: She is not toxic-appearing or diaphoretic.  HENT:     Head:      Comments: Top of scalp with healing laceration wound and 15 staples in place counted Neurological:     Mental Status: She is alert.      UC Treatments / Results  Labs (all labs ordered are listed, but only abnormal results are displayed) Labs Reviewed - No data to display  EKG None  Radiology No results found.  Procedures Procedures (including critical  care time)  Medications Ordered in UC Medications - No data to display  Initial Impression / Assessment and Plan / UC Course  I have reviewed the triage vital signs and the nursing  notes.  Pertinent labs & imaging results that were available during my care of the patient were reviewed by me and considered in my medical decision making (see chart for details).      Final Clinical Impressions(s) / UC Diagnoses   Final diagnoses:  Visit for wound check    ED Prescriptions    None     1. diagnosis reviewed with patient 2. Continue routine wound care 3. Follow-up as instructed for staple removal   Controlled Substance Prescriptions [place] Controlled Substance Registry consulted? Not Applicable   Norval Gable, MD 02/28/18 [id number]

## 2018-02-28 NOTE — ED Triage Notes (Signed)
Patient c/o syncopal episode on Saturday. She states she hit her head and has 16 staples in her head. She is here to just have them looked at to make sure she can shower. She states she is to return in 6 days to have them removed.

## 2018-03-02 ENCOUNTER — Telehealth: Payer: Self-pay | Admitting: Obstetrics and Gynecology

## 2018-03-02 DIAGNOSIS — F411 Generalized anxiety disorder: Secondary | ICD-10-CM | POA: Diagnosis not present

## 2018-03-02 DIAGNOSIS — F5105 Insomnia due to other mental disorder: Secondary | ICD-10-CM | POA: Diagnosis not present

## 2018-03-02 DIAGNOSIS — F3181 Bipolar II disorder: Secondary | ICD-10-CM | POA: Diagnosis not present

## 2018-03-02 NOTE — Telephone Encounter (Signed)
Patient scheduled 4/20 for mirena replacement with SDJ at [facility]

## 2018-03-09 ENCOUNTER — Ambulatory Visit: Payer: Self-pay

## 2018-03-09 NOTE — Telephone Encounter (Signed)
Pt c/o bilateral ankle edema and noted rash last night.. Pt stated that she developed the ankle swelling yesterday and edema is pitting edema. Pt stated she has had ankle edema on and off for 2 years.  No fever. Rash looks like red ant bites or flea bites. Pt denies pain but is having itching. Care advice given to pt and pt verbalized understanding. Appt given for tomorrow. No openings with Dr Darrick Huntsman.  Reason for Disposition . [1] Localized rash is very painful AND [2] no fever . [1] MILD swelling of both ankles (i.e., pedal edema) AND [2] new onset or worsening  Answer Assessment - Initial Assessment Questions 1. LOCATION: "Which joint is swollen?"     Both ankles left greater than right 2. ONSET: "When did the swelling start?"     yesterday 3. SIZE: "How large is the swelling?"     Pitting edema midinstrep top of foot 2 inches of malleos- lateral sides are worse than than medial 4. PAIN: "Is there any pain?" If so, ask: "How bad is it?" (Scale 1-10; or mild, moderate, severe)     no 5. CAUSE: "What do you think caused the swollen joint?"     Pt doesn't know worse by end of day has had off and on 2 years 6. OTHER SYMPTOMS: "Do you have any other symptoms?" (e.g., fever, chest pain, difficulty breathing, calf pain)     Rash last night 7. PREGNANCY: "Is there any chance you are pregnant?" "When was your last menstrual period?"     No LMP: IUD  Answer Assessment - Initial Assessment Questions 1. ONSET: "When did the swelling start?" (e.g., minutes, hours, days)     yesterday 2. LOCATION: "What part of the leg is swollen?"  "Are both legs swollen or just one leg?"     Bilateral ankle edema 3. SEVERITY: "How bad is the swelling?" (e.g., localized; mild, moderate, severe)  - Localized - small area of swelling localized to one leg  - MILD pedal edema - swelling limited to foot and ankle, pitting edema < 1/4 inch (6 mm) deep, rest and elevation eliminate most or all swelling  - MODERATE edema -  swelling of lower leg to knee, pitting edema > 1/4 inch (6 mm) deep, rest and elevation only partially reduce swelling  - SEVERE edema - swelling extends above knee, facial or hand swelling present      mild 4. REDNESS: "Does the swelling look red or infected?"     no 5. PAIN: "Is the swelling painful to touch?" If so, ask: "How painful is it?"   (Scale 1-10; mild, moderate or severe)     no 6. FEVER: "Do you have a fever?" If so, ask: "What is it, how was it measured, and when did it start?"      no 7. CAUSE: "What do you think is causing the leg swelling?"     Pt doesn't know 8. MEDICAL HISTORY: "Do you have a history of heart failure, kidney disease, liver failure, or cancer?"     no 9. RECURRENT SYMPTOM: "Have you had leg swelling before?" If so, ask: "When was the last time?" "What happened that time?"     For 2 years 10. OTHER SYMPTOMS: "Do you have any other symptoms?" (e.g., chest pain, difficulty breathing)       rash 11. PREGNANCY: "Is there any chance you are pregnant?" "When was your last menstrual period?"       no  Answer Assessment - Initial  Assessment Questions 1. APPEARANCE of RASH: "Describe the rash."       Red bumps small like ant bites underlying skin is normal in color 2. LOCATION: "Where is the rash located?"  Goes to mid calf circumfernces of lower leg to calves- 3. NUMBER: "How many spots are there?"      Red spots only undernying normal  4. SIZE: "How big are the spots?" (Inches, centimeters or compare to size of a coin)      Looks like ant bites or flea bites 5. ONSET: "When did the rash start?"      Last night 6. ITCHING: "Does the rash itch?" If so, ask: "How bad is the itch?"  (Scale 1-10; or mild, moderate, severe)     Yes- mild to moderate- intesive care for hands 7. PAIN: "Does the rash hurt?" If so, ask: "How bad is the pain?"  (Scale 1-10; or mild, moderate, severe)     No- stings 8. OTHER SYMPTOMS: "Do you have any other symptoms?" (e.g.,  fever)     no 9. PREGNANCY: "Is there any chance you are pregnant?" "When was your last menstrual period?"     No IUD  Protocols used: RASH OR REDNESS - LOCALIZED-A-AH, LEG SWELLING AND EDEMA-A-AH, ANKLE SWELLING-A-AH

## 2018-03-10 ENCOUNTER — Encounter: Payer: Self-pay | Admitting: Family Medicine

## 2018-03-10 ENCOUNTER — Ambulatory Visit: Payer: BLUE CROSS/BLUE SHIELD | Admitting: Family Medicine

## 2018-03-10 VITALS — BP 98/62 | HR 66 | Temp 98.2°F | Resp 16 | Ht 62.5 in | Wt 158.8 lb

## 2018-03-10 DIAGNOSIS — D696 Thrombocytopenia, unspecified: Secondary | ICD-10-CM

## 2018-03-10 DIAGNOSIS — R21 Rash and other nonspecific skin eruption: Secondary | ICD-10-CM | POA: Diagnosis not present

## 2018-03-10 DIAGNOSIS — R600 Localized edema: Secondary | ICD-10-CM | POA: Diagnosis not present

## 2018-03-10 NOTE — Progress Notes (Signed)
Subjective:    Patient ID: Carolyn Clements, female    DOB: [birth year], 48 y.o.   MRN: [id number]   HPI  Patient presents to clinic complaining of swelling in both ankles that usually she notices by the end of her long workday, but resolves by the morning after having legs elevated.  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  Currently does not wear any other compression hose.  Also noticed a red and itchy rash that appeared on both ankles, face and arms 2 days ago.  Did take a course of Keflex 2 weeks ago, wonders if Keflex caused the rash.  Rash does seem somewhat improved today.  Denies any new soaps, lotions, detergents, places or foods.   Patient Active Problem List   Diagnosis Date Noted  . Hemorrhoids 12/20/2014  . Nausea 12/20/2014  . Congenital enchondromatosis 07/03/2014  . Fatigue 11/07/2013  . Anemia, iron deficiency 11/22/2012  . Generalized anxiety disorder 01/07/2012  . GERD (gastroesophageal reflux disease)   . Migraine headache    Social History   Tobacco Use  . Smoking status: Never Smoker  . Smokeless tobacco: Never Used  Substance Use Topics  . Alcohol use: Yes    Alcohol/week: 3.0 standard drinks    Types: 3 Shots of liquor per week    Comment:     Review of Systems  Constitutional: Negative for chills, fatigue and fever.  HENT: Negative for congestion, ear pain, sinus pain and sore throat.   Eyes: Negative.   Respiratory: Negative for cough, shortness of breath and wheezing.   Cardiovascular: Negative for chest pain, palpitations. +LE edema Gastrointestinal: Negative for abdominal pain, diarrhea, nausea and vomiting.  Genitourinary: Negative for dysuria, frequency and urgency.  Musculoskeletal: Negative for arthralgias and myalgias.  Skin: Negative for color change, pallor. +rash Neurological: Negative for syncope, light-headedness and headaches.  Psychiatric/Behavioral: The patient is not nervous/anxious.        Objective:   Physical Exam Vitals signs and nursing note reviewed.  Constitutional:      General: She is not in acute distress.    Appearance: She is not toxic-appearing or diaphoretic.  HENT:     Head: Normocephalic and atraumatic.     Nose: Nose normal.  Eyes:     General: No scleral icterus.    Extraocular Movements: Extraocular movements intact.     Conjunctiva/sclera: Conjunctivae normal.     Pupils: Pupils are equal, round, and reactive to light.  Neck:     Musculoskeletal: Neck supple. No neck rigidity.  Cardiovascular:     Rate and Rhythm: Normal rate and regular rhythm.  Pulmonary:     Effort: Pulmonary effort is normal. No respiratory distress.     Breath sounds: Normal breath sounds.  Musculoskeletal:     Comments: Trace ankle swelling. Non pitting.   Lymphadenopathy:     Cervical: No cervical adenopathy.  Skin:    General: Skin is warm and dry.     Capillary Refill: Capillary refill takes less than 2 seconds.     Coloration: Skin is not jaundiced or pale.     Findings: Rash (faint maculopapular rash seen on bilat ankles. skin on face does not appear to have rash today. ) present.  Neurological:     Mental Status: She is alert and oriented to person, place, and time.    Vitals:   03/10/18 1604  BP: 98/62  Pulse: 66  Resp: 16  Temp:  98.2 F (36.8 C)  SpO2: 96%      Assessment & Plan:   Lower extremity edema- minimal/trace edema noted on exam in clinic.  Patient wearing ankle socks in office today, advised to try using knee-high socks/compression hose due to her job requires her to be on her feet for many hours throughout the day.  Also advised elevating legs whenever able to.  Rash- possible rash caused by antibiotic reaction, could also be a contact dermatitis.  Advised she can do a histamine blockade of either Claritin or Allegra in the morning, Pepcid 20 mg at bedtime to block both H1 and H2 to reduce itching and rash.  Also advised she can do a  topical cortisone cream to treat rash externally, offered prescription however she states she has cortisone at home.  Due to unknown reason for rash, and swelling we will do blood work in clinic  Patient will keep already planned follow-up as scheduled.  She will return to clinic sooner if any issues arise.  We will base need for any sooner follow-up on lab results.

## 2018-03-10 NOTE — Patient Instructions (Signed)
Using topical creams such as cortisone to treat rash externally.  To reduce the rash/itching and treat rash internally, can do a histamine blockade by taking 10 mg of Claritin or 180 mg of Allegra and Pepcid 20 mg daily to block both the H1 and H2

## 2018-03-11 LAB — CBC WITH DIFFERENTIAL/PLATELET
BASOS ABS: 0.1 10*3/uL (ref 0.0–0.1)
Basophils Relative: 1.2 % (ref 0.0–3.0)
Eosinophils Absolute: 0.1 10*3/uL (ref 0.0–0.7)
Eosinophils Relative: 2 % (ref 0.0–5.0)
HCT: 35.6 % — ABNORMAL LOW (ref 36.0–46.0)
Hemoglobin: 12 g/dL (ref 12.0–15.0)
LYMPHS ABS: 1.4 10*3/uL (ref 0.7–4.0)
Lymphocytes Relative: 23.6 % (ref 12.0–46.0)
MCHC: 33.8 g/dL (ref 30.0–36.0)
MCV: 93.6 fl (ref 78.0–100.0)
MONOS PCT: 9.4 % (ref 3.0–12.0)
Monocytes Absolute: 0.5 10*3/uL (ref 0.1–1.0)
NEUTROS ABS: 3.7 10*3/uL (ref 1.4–7.7)
NEUTROS PCT: 63.8 % (ref 43.0–77.0)
PLATELETS: 147 10*3/uL — AB (ref 150.0–400.0)
RBC: 3.81 Mil/uL — ABNORMAL LOW (ref 3.87–5.11)
RDW: 13.6 % (ref 11.5–15.5)
WBC: 5.7 10*3/uL (ref 4.0–10.5)

## 2018-03-11 LAB — B12 AND FOLATE PANEL
Folate: 17 ng/mL (ref >5.9)
Vitamin B-12: 617 pg/mL (ref 211–911)

## 2018-03-11 LAB — COMPREHENSIVE METABOLIC PANEL
ALT: 31 U/L (ref 0–35)
AST: 23 U/L (ref 0–37)
Albumin: 4.1 g/dL (ref 3.5–5.2)
Alkaline Phosphatase: 48 U/L (ref 39–117)
BILIRUBIN TOTAL: 0.3 mg/dL (ref 0.2–1.2)
BUN: 25 mg/dL — AB (ref 6–23)
CO2: 28 meq/L (ref 19–32)
Calcium: 9.1 mg/dL (ref 8.4–10.5)
Chloride: 101 mEq/L (ref 96–112)
Creatinine, Ser: 0.83 mg/dL (ref 0.40–1.20)
GFR: 74.21 mL/min (ref >60.00)
GLUCOSE: 98 mg/dL (ref 70–99)
POTASSIUM: 4.1 meq/L (ref 3.5–5.1)
SODIUM: 136 meq/L (ref 135–145)
Total Protein: 6.7 g/dL (ref 6.0–8.3)

## 2018-03-11 LAB — THYROID PANEL WITH TSH
Free Thyroxine Index: 1.9 (ref 1.4–3.8)
T3 UPTAKE: 28 % (ref 22–35)
T4 TOTAL: 6.7 ug/dL (ref 5.1–11.9)
TSH: 1.09 m[IU]/L

## 2018-03-11 LAB — VITAMIN D 25 HYDROXY (VIT D DEFICIENCY, FRACTURES): VITD: 28.42 ng/mL — AB (ref 30.00–100.00)

## 2018-03-14 NOTE — Addendum Note (Signed)
Addended by: Leanora Cover on: 03/14/2018 12:49 PM   Modules accepted: Orders

## 2018-03-15 ENCOUNTER — Telehealth: Payer: Self-pay

## 2018-03-15 NOTE — Telephone Encounter (Signed)
Copied from CRM [id number]. Topic: General - Other >> Mar 15, 2018  8:23 AM Elliot Gault wrote: Patient requesting lab results please mail to home

## 2018-03-15 NOTE — Telephone Encounter (Signed)
Mailed out copy of lab results from 03/10/18 OV today per pt request.  Called and informed pt.

## 2018-03-28 ENCOUNTER — Other Ambulatory Visit: Payer: BLUE CROSS/BLUE SHIELD

## 2018-04-04 ENCOUNTER — Telehealth: Payer: Self-pay

## 2018-04-04 NOTE — Telephone Encounter (Signed)
Copied from CRM [id number]. Topic: Appointment Scheduling - Scheduling Inquiry for Clinic >> Apr 04, 2018  8:14 AM Fanny Bien wrote: Reason for CRM: pt called and left a voicemail that stated that she would like to reschedule a lab appointment that she had on  03/28/18

## 2018-04-05 ENCOUNTER — Other Ambulatory Visit: Payer: Self-pay

## 2018-04-05 ENCOUNTER — Other Ambulatory Visit (INDEPENDENT_AMBULATORY_CARE_PROVIDER_SITE_OTHER): Payer: BLUE CROSS/BLUE SHIELD

## 2018-04-05 DIAGNOSIS — D696 Thrombocytopenia, unspecified: Secondary | ICD-10-CM | POA: Diagnosis not present

## 2018-04-05 LAB — CBC WITH DIFFERENTIAL/PLATELET
BASOS PCT: 0.6 % (ref 0.0–3.0)
Basophils Absolute: 0 10*3/uL (ref 0.0–0.1)
EOS PCT: 1.1 % (ref 0.0–5.0)
Eosinophils Absolute: 0.1 10*3/uL (ref 0.0–0.7)
HCT: 37.7 % (ref 36.0–46.0)
Hemoglobin: 12.8 g/dL (ref 12.0–15.0)
LYMPHS ABS: 1.7 10*3/uL (ref 0.7–4.0)
Lymphocytes Relative: 29.2 % (ref 12.0–46.0)
MCHC: 33.9 g/dL (ref 30.0–36.0)
MCV: 92.7 fl (ref 78.0–100.0)
MONO ABS: 0.5 10*3/uL (ref 0.1–1.0)
MONOS PCT: 9.1 % (ref 3.0–12.0)
NEUTROS ABS: 3.6 10*3/uL (ref 1.4–7.7)
NEUTROS PCT: 60 % (ref 43.0–77.0)
PLATELETS: 170 10*3/uL (ref 150.0–400.0)
RBC: 4.06 Mil/uL (ref 3.87–5.11)
RDW: 13.3 % (ref 11.5–15.5)
WBC: 5.9 10*3/uL (ref 4.0–10.5)

## 2018-04-05 NOTE — Telephone Encounter (Signed)
Spoke with pt and she has been rescheduled for lab work today at [facility]. Pt is aware of appt date and time.

## 2018-04-05 NOTE — Telephone Encounter (Signed)
Noted. Will order to arrive by apt date/time. 

## 2018-04-25 ENCOUNTER — Ambulatory Visit: Payer: BLUE CROSS/BLUE SHIELD | Admitting: Obstetrics and Gynecology

## 2018-04-25 NOTE — Telephone Encounter (Signed)
Mirena reserved for this patient for visit on 04/27/2018 w/SDJ

## 2018-04-27 ENCOUNTER — Encounter: Payer: Self-pay | Admitting: Obstetrics and Gynecology

## 2018-04-27 ENCOUNTER — Other Ambulatory Visit: Payer: Self-pay

## 2018-04-27 ENCOUNTER — Ambulatory Visit (INDEPENDENT_AMBULATORY_CARE_PROVIDER_SITE_OTHER): Payer: BLUE CROSS/BLUE SHIELD | Admitting: Obstetrics and Gynecology

## 2018-04-27 VITALS — BP 110/70 | Wt 157.0 lb

## 2018-04-27 DIAGNOSIS — Z124 Encounter for screening for malignant neoplasm of cervix: Secondary | ICD-10-CM

## 2018-04-27 DIAGNOSIS — Z30433 Encounter for removal and reinsertion of intrauterine contraceptive device: Secondary | ICD-10-CM | POA: Diagnosis not present

## 2018-04-27 MED ORDER — LEVONORGESTREL 20 MCG/24HR IU IUD: 1.0000 | INTRAUTERINE_SYSTEM | Freq: Once | INTRAUTERINE | 0 refills | Status: DC

## 2018-04-27 NOTE — Progress Notes (Signed)
    IUD Removal  Patient identified, informed consent performed, consent signed.  Patient was in the dorsal lithotomy position, normal external genitalia was noted.  A speculum was placed in the patient's vagina, normal discharge was noted, no lesions. The cervix was visualized, no lesions, no abnormal discharge.  Since the strings of the IUD were not visible, an attempt was made to tease the strings through the os using a cervical brush.  This did not work. So, after cleaning her cervix with betadine x 2 and applying a single-tooth tenaculum to the anterior lip of her cervix, her cervix was dilated gently.  The IUD "hook" was utilized and the IUD was removed in its entirety intact. Patient tolerated the procedure well.    IUD Insertion Procedure Note Patient identified, informed consent performed, consent signed.   Discussed risks of irregular bleeding, cramping, infection, malpositioning, expulsion or uterine perforation of the IUD (1:1000 placements)  which may require further procedure such as laparoscopy.  IUD while effective at preventing pregnancy do not prevent transmission of sexually transmitted diseases and use of barrier methods for this purpose was discussed. Time out was performed.  Urine pregnancy test negative.  Speculum already placed in the vagina.  Cervix previously visualized.  Cleaned with Betadine x 2 again.  Already the cervix had been grasped anteriorly with a single tooth tenaculum.  Uterus sounded to 9 cm. IUD placed per manufacturer's recommendations.  Strings trimmed to 4 cm. Tenaculum was removed, good hemostasis noted.  Patient tolerated procedure well.   Patient was given post-procedure instructions.  She was advised to have backup contraception for one week.  Patient was also asked to check IUD strings periodically and follow up in 4 weeks for IUD check.  Thomasene Mohair, MD, [facility], [facility] 04/27/2018 5:16 PM

## 2018-04-28 ENCOUNTER — Other Ambulatory Visit (HOSPITAL_COMMUNITY)
Admission: RE | Admit: 2018-04-28 | Discharge: 2018-04-28 | Disposition: A | Discharge status: Home / Self Care | Payer: BLUE CROSS/BLUE SHIELD | Source: Ambulatory Visit | Attending: Obstetrics and Gynecology | Admitting: Obstetrics and Gynecology

## 2018-04-28 DIAGNOSIS — Z124 Encounter for screening for malignant neoplasm of cervix: Secondary | ICD-10-CM | POA: Insufficient documentation

## 2018-04-28 NOTE — Addendum Note (Signed)
Addended by: Thomasene Mohair D on: 04/28/2018 05:36 PM   Modules accepted: Orders

## 2018-05-03 LAB — CYTOLOGY - PAP
Diagnosis: UNDETERMINED — AB
HPV: NOT DETECTED

## 2018-05-10 ENCOUNTER — Telehealth: Payer: Self-pay | Admitting: Obstetrics and Gynecology

## 2018-05-10 NOTE — Telephone Encounter (Signed)
Discussed Pap smear result of ASCUS, HPV negative.  This is her second in a row.  Strictly speaking, according to the ASCCP, she would just need a repeat Pap smear in 3 years.  However, if this were considered more of a low-grade abnormality, according to the ASCCP, a colposcopy would be required.  Given the circumstance, I gave the patient the option of simply repeating the Pap smear versus having a colposcopy.  After discussing the relative risks and benefits of each approach she elects to undergo a colposcopy.  She will call and schedule.

## 2018-05-18 DIAGNOSIS — M5440 Lumbago with sciatica, unspecified side: Secondary | ICD-10-CM | POA: Diagnosis not present

## 2018-05-25 ENCOUNTER — Encounter: Payer: Self-pay | Admitting: Obstetrics and Gynecology

## 2018-05-25 ENCOUNTER — Ambulatory Visit (INDEPENDENT_AMBULATORY_CARE_PROVIDER_SITE_OTHER): Payer: BLUE CROSS/BLUE SHIELD | Admitting: Obstetrics and Gynecology

## 2018-05-25 ENCOUNTER — Other Ambulatory Visit: Payer: Self-pay

## 2018-05-25 VITALS — BP 118/74 | Ht 62.0 in | Wt 157.0 lb

## 2018-05-25 DIAGNOSIS — Z30431 Encounter for routine checking of intrauterine contraceptive device: Secondary | ICD-10-CM

## 2018-05-25 NOTE — Progress Notes (Signed)
IUD String Check  Subjctive: [name] presents for IUD string check.  She had a Mirena placed 4 weeks ago.  Since placement of her IUD she had minimal vaginal bleeding.  She denies cramping or discomfort.  She has had intercourse since placement.  She has checked the strings.  She denies any fever, chills, nausea, vomiting, or other complaints.    Objective: BP 118/74   Ht 5\' 2"  (1.575 m)   Wt 157 lb (71.2 kg)   BMI 28.72 kg/m  Physical Exam Exam conducted with a chaperone present.  Constitutional:      General: She is not in acute distress.    Appearance: She is well-developed.  HENT:     Head: Normocephalic and atraumatic.     Nose: Nose normal.  Eyes:     Conjunctiva/sclera: Conjunctivae normal.  Neck:     Musculoskeletal: Normal range of motion.  Cardiovascular:     Rate and Rhythm: Normal rate and regular rhythm.     Heart sounds: Normal heart sounds.  Pulmonary:     Effort: Pulmonary effort is normal.     Breath sounds: Normal breath sounds.  Abdominal:     General: There is no distension.     Palpations: Abdomen is soft.     Tenderness: There is no abdominal tenderness. There is no guarding or rebound.     Hernia: There is no hernia in the right inguinal area or left inguinal area.  Genitourinary:    General: Normal vulva.     Exam position: Lithotomy position.     Labia:        Right: No rash, tenderness, lesion or injury.        Left: No rash, tenderness, lesion or injury.      Vagina: Normal. No vaginal discharge.     Cervix: Normal.     Uterus: Normal. Not tender.      Adnexa: Right adnexa normal and left adnexa normal.       Right: No mass.         Left: No mass.       Comments: IUD Strings visualized and 3 cm in length Musculoskeletal: Normal range of motion.  Lymphadenopathy:     Lower Body: No right inguinal adenopathy. No left inguinal adenopathy.  Skin:    General: Skin is warm and dry.     Findings: No rash.  Neurological:   Mental Status: She is alert and oriented to person, place, and time.  Psychiatric:        Behavior: Behavior normal.     Female chaperone was present for the entirety of the pelvic exam  Assessment: 48 y.o. year old female status post prior Mirena IUD placement 4 week ago, doing well.  Plan: 1.  The patient was given instructions to check her IUD strings monthly and call with any problems or concerns.  She should call for fevers, chills, abnormal vaginal discharge, pelvic pain, or other complaints. 2.  She will return for a annual exam in 1 year.  All questions answered.  15 minutes spent in face to face discussion with > 50% spent in counseling, management, and coordination of care for her newly-placed IUD.  Risks and benefits of IUD discussed including the risks of irregular bleeding, cramping, infection, malpositioning, expulsion, which may require further procedures such as laparoscopy.  IUDs while effective at preventing pregnancy do not prevent transmission of sexually transmitted diseases and use of barrier methods for this purpose was  discussed.  Low overall incidence of failure with 99.7% efficacy rate in typical use.    Thomasene MohairStephen Jamon Hayhurst, MD 05/25/2018 9:37 AM

## 2018-06-07 ENCOUNTER — Ambulatory Visit (INDEPENDENT_AMBULATORY_CARE_PROVIDER_SITE_OTHER): Payer: BLUE CROSS/BLUE SHIELD | Admitting: Obstetrics and Gynecology

## 2018-06-07 ENCOUNTER — Other Ambulatory Visit: Payer: Self-pay

## 2018-06-07 ENCOUNTER — Encounter: Payer: Self-pay | Admitting: Obstetrics and Gynecology

## 2018-06-07 ENCOUNTER — Other Ambulatory Visit (HOSPITAL_COMMUNITY)
Admission: RE | Admit: 2018-06-07 | Discharge: 2018-06-07 | Disposition: A | Discharge status: Home / Self Care | Payer: BC Managed Care – PPO | Source: Ambulatory Visit | Attending: Obstetrics and Gynecology | Admitting: Obstetrics and Gynecology

## 2018-06-07 VITALS — BP 128/88 | Ht 62.5 in | Wt 154.0 lb

## 2018-06-07 DIAGNOSIS — R87610 Atypical squamous cells of undetermined significance on cytologic smear of cervix (ASC-US): Secondary | ICD-10-CM

## 2018-06-07 NOTE — Progress Notes (Signed)
 HPI:  Carolyn Clements is a 48 y.o.  G3P2012  who presents today for evaluation and management of abnormal cervical cytology.    Dysplasia History:  2020, 2018 ASCUS, HPV negative  OB History  Gravida Para Term Preterm AB Living  3 2 2   1 2  SAB TAB Ectopic Multiple Live Births  1       2    # Outcome Date GA Lbr Len/2nd Weight Sex Delivery Anes PTL Lv  3 Term 2005 [redacted]w[redacted]d   F CS-LTranv        Birth Comments: ERCS  2 Term 2003 [redacted]w[redacted]d   F CS-LTranv        Birth Comments: Emergent CS  1 SAB 2002 [redacted]w[redacted]d            Birth Comments: D&C    Past Medical History:  Diagnosis Date  . Anxiety   . Enchondroma, multiple   . GERD (gastroesophageal reflux disease)   . IUD    [facility]  Dr Pratt  . Migraine headache    sinus related  . Motion sickness    cars - long trips  . MVP (mitral valve prolapse)    Saw cardiology for syncope 7/16.  . PONV (postoperative nausea and vomiting)   . Pyloric stenosis infant   Inguinal Hernia repair  . Screening for cervical cancer April 2012   normal PAP   . Wears contact lenses     Past Surgical History:  Procedure Laterality Date  . ARTHROSCOPIC REPAIR ACL     torn/repaired ACL, followed by patellar fractuer, high school [employer]  . CESAREAN SECTION     2003 and 2004  . HERNIA REPAIR  1976  . IUD REMOVAL N/A 05/17/2013   Procedure: INTRAUTERINE DEVICE (IUD) REMOVAL;  Surgeon: Tanya S Pratt, MD;  Location: [facility];  Service: Gynecology;  Laterality: N/A;  Replacement of IUD  . PYLOROPLASTY  1974  . TOE SURGERY Left 2004   Enchondroma removal - great toe  . TONSILLECTOMY Bilateral 05/16/2015   Procedure: TONSILLECTOMY;  Surgeon: Paul Juengel, MD;  Location: [facility];  Service: ENT;  Laterality: Bilateral;  Latex sensitivity    SOCIAL HISTORY:  Social History   Substance and Sexual Activity  Alcohol Use Yes  . Alcohol/week: 3.0 standard drinks  . Types: 3 Shots of liquor per week   Comment:       Social History   Substance and Sexual Activity  Drug Use No     Family History  Problem Relation Age of Onset  . Cancer Father        adenocarcinoma, nonsmoker  . COPD Mother   . Diabetes Mother   . Hypertension Mother   . Cancer Maternal Grandfather        lung  . BRCA 1/2 Paternal Grandmother   . Cancer Paternal Grandfather        lung    ALLERGIES:  Codeine sulfate; Sulfa antibiotics; Tape; and Latex  Current Outpatient Medications on File Prior to Visit  Medication Sig Dispense Refill  . calcium-vitamin D (OSCAL WITH D) 500-200 MG-UNIT tablet Take 1 tablet by mouth.    . cephALEXin (KEFLEX) 500 MG capsule Take 500 mg by mouth 4 (four) times daily.    . gabapentin (NEURONTIN) 100 MG capsule TK 1 C PO HS  0  . ketotifen (ZADITOR) 0.025 % ophthalmic solution Place 1 drop into both eyes 2 (two) times daily.      .   lamoTRIgine (LAMICTAL) 200 MG tablet TK 1 T PO D  0  . levonorgestrel (MIRENA) 20 MCG/24HR IUD 1 Intra Uterine Device (1 each total) by Intrauterine route once for 1 dose. 1 each 0  . metroNIDAZOLE (METROGEL) 0.75 % gel Apply 1 application topically 2 (two) times daily.    . omeprazole (PRILOSEC) 20 MG capsule TAKE ONE CAPSULE BY MOUTH TWICE A DAY 60 capsule 5  . Probiotic Product (PROBIOTIC FORMULA PO) Take 1 capsule by mouth 2 (two) times daily.     . traZODone (DESYREL) 150 MG tablet Take 150 mg by mouth at bedtime.     . triamcinolone (NASACORT ALLERGY 24HR) 55 MCG/ACT AERO nasal inhaler Place 2 sprays into the nose daily.     No current facility-administered medications on file prior to visit.     Physical Exam: -Vitals:  BP 128/88   Ht 5' 2.5" (1.588 m)   Wt 154 lb (69.9 kg)   BMI 27.72 kg/m  GEN: WD, WN, NAD.  A+ O x 3, good mood and affect. ABD:  NT, ND.  Soft, no masses.  No hernias noted.   Pelvic:   Vulva: Normal appearance.  No lesions.  Vagina: No lesions or abnormalities noted.  Support: Normal pelvic support.  Urethra No masses tenderness  or scarring.  Meatus Normal size without lesions or prolapse.  Cervix: See below.  Anus: Normal exam.  No lesions.  Perineum: Normal exam.  No lesions.        Bimanual   Uterus: Normal size.  Non-tender.  Mobile.  AV.  Adnexae: No masses.  Non-tender to palpation.  Cul-de-sac: Negative for abnormality.   PROCEDURE: 1.  Urine Pregnancy Test:  not done 2.  Colposcopy performed with 4% acetic acid after verbal consent obtained                                         -Aceto-white Lesions Location(s):  Not done              -Biopsy performed at 6 o'clock               -ECC indicated and performed: Yes.       -Biopsy sites made hemostatic with pressure, AgNO3, and/or Monsel's solution   -Satisfactory colposcopy: No.    -Evidence of Invasive cervical CA :  NO  ASSESSMENT:  Carolyn Clements is a 48 y.o. G3P2012 here for  1. Atypical squamous cells of undetermined significance (ASCUS) on Papanicolaou smear of cervix   .  PLAN: I discussed the grading system of pap smears and HPV high risk viral types.  We will discuss and base management after colpo results return.      Stephen Jackson, MD  [facility], [facility] 06/07/2018  10:01 AM  

## 2018-06-23 DIAGNOSIS — F5105 Insomnia due to other mental disorder: Secondary | ICD-10-CM | POA: Diagnosis not present

## 2018-06-23 DIAGNOSIS — F411 Generalized anxiety disorder: Secondary | ICD-10-CM | POA: Diagnosis not present

## 2018-06-23 DIAGNOSIS — F3181 Bipolar II disorder: Secondary | ICD-10-CM | POA: Diagnosis not present

## 2018-10-13 DIAGNOSIS — M5412 Radiculopathy, cervical region: Secondary | ICD-10-CM | POA: Diagnosis not present

## 2018-10-13 DIAGNOSIS — M7542 Impingement syndrome of left shoulder: Secondary | ICD-10-CM | POA: Diagnosis not present

## 2018-10-17 DIAGNOSIS — F3181 Bipolar II disorder: Secondary | ICD-10-CM | POA: Diagnosis not present

## 2018-10-17 DIAGNOSIS — F5105 Insomnia due to other mental disorder: Secondary | ICD-10-CM | POA: Diagnosis not present

## 2018-10-17 DIAGNOSIS — F411 Generalized anxiety disorder: Secondary | ICD-10-CM | POA: Diagnosis not present

## 2018-12-08 ENCOUNTER — Other Ambulatory Visit (HOSPITAL_COMMUNITY)
Admission: RE | Admit: 2018-12-08 | Discharge: 2018-12-08 | Disposition: A | Discharge status: Home / Self Care | Payer: BC Managed Care – PPO | Source: Ambulatory Visit | Attending: Advanced Practice Midwife | Admitting: Advanced Practice Midwife

## 2018-12-08 ENCOUNTER — Other Ambulatory Visit: Payer: Self-pay

## 2018-12-08 ENCOUNTER — Encounter: Payer: Self-pay | Admitting: Advanced Practice Midwife

## 2018-12-08 ENCOUNTER — Telehealth: Payer: Self-pay | Admitting: Internal Medicine

## 2018-12-08 ENCOUNTER — Ambulatory Visit (INDEPENDENT_AMBULATORY_CARE_PROVIDER_SITE_OTHER): Payer: Self-pay | Admitting: Advanced Practice Midwife

## 2018-12-08 VITALS — BP 118/69 | HR 61 | Ht 62.5 in | Wt 149.0 lb

## 2018-12-08 DIAGNOSIS — N898 Other specified noninflammatory disorders of vagina: Secondary | ICD-10-CM | POA: Insufficient documentation

## 2018-12-08 MED ORDER — FLUCONAZOLE 150 MG PO TABS: 150.0000 mg | ORAL_TABLET | Freq: Once | ORAL | 3 refills | Status: AC

## 2018-12-08 NOTE — Telephone Encounter (Signed)
Pt called about having a possible UTI. Pressure to urinate. Itching. No appt avail. Please advise and Thank you!  Call pt @ [phone].

## 2018-12-08 NOTE — Telephone Encounter (Signed)
Spoke with pt and she stated that has an appt with [facility].

## 2018-12-08 NOTE — Progress Notes (Addendum)
Patient ID: Carolyn Clements, female   DOB: 13-Apr-1970, 48 y.o.   MRN: [id number]  Reason for Visit: Vaginitis (vaginal itching x2 weeks, discharge)    Subjective:     HPI:  Carolyn Clements is a 48 y.o. female being seen for vaginal itching and discharge. She began having itching with some thin white discharge about a week ago. She took a dose of Diflucan that she had at home and noticed some relief. At this time she still has a mild itch and a small amount of thin discharge. She does not notice any odor or vaginal irritation. She has some pressure to urinate but no burning. She does not have any other concerns at this time. We discussed recommended ways to prevent vaginitis.  Past Medical History:  Diagnosis Date  . Anxiety   . Enchondroma, multiple   . GERD (gastroesophageal reflux disease)   . IUD    [facility]  Dr Kennon Rounds  . Migraine headache    sinus related  . Motion sickness    cars - long trips  . MVP (mitral valve prolapse)    Saw cardiology for syncope 7/16.  Marland Kitchen PONV (postoperative nausea and vomiting)   . Pyloric stenosis infant   Inguinal Hernia repair  . Screening for cervical cancer April 2012   normal PAP   . Wears contact lenses    Family History  Problem Relation Age of Onset  . Cancer Father        adenocarcinoma, nonsmoker  . COPD Mother   . Diabetes Mother   . Hypertension Mother   . Cancer Maternal Grandfather        lung  . BRCA 1/2 Paternal Grandmother   . Cancer Paternal Grandfather        lung   Past Surgical History:  Procedure Laterality Date  . 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 CESAREAN SECTION     2003 and 2004  . HERNIA REPAIR  1976  . IUD REMOVAL N/A 05/17/2013   Procedure: INTRAUTERINE DEVICE (IUD) REMOVAL;  Surgeon: [name], MD;  Location: [facility];  Service: Gynecology;  Laterality: N/A;  Replacement of IUD  . PYLOROPLASTY  1974  . TOE  SURGERY Left 2004   Enchondroma removal - great toe  . TONSILLECTOMY Bilateral 05/16/2015   Procedure: TONSILLECTOMY;  Surgeon: Margaretha Sheffield, MD;  Location: [facility];  Service: ENT;  Laterality: Bilateral;  Latex sensitivity    Short Social History:  Social History   Tobacco Use  . Smoking status: Never Smoker  . Smokeless tobacco: Never Used  Substance Use Topics  . Alcohol use: Yes    Alcohol/week: 3.0 standard drinks    Types: 3 Shots of liquor per week    Comment:      Allergies  Allergen Reactions  . Codeine Sulfate Itching and Nausea And Vomiting    Says all narcotics cause itching and she tries to avoid  . Sulfa Antibiotics Hives    Hives and fever at age of 49, has not had since then  . Tape Itching    whelts  . Latex Rash    Not severe, but avoids gloves  condoms    Current Outpatient Medications  Medication Sig Dispense Refill  . gabapentin (NEURONTIN) 100 MG capsule TK 1 C PO HS  0  . ketotifen (ZADITOR) 0.025 % ophthalmic solution Place 1 drop into both  eyes 2 (two) times daily.      Marland Kitchen lamoTRIgine (LAMICTAL) 200 MG tablet TK 1 T PO D  0  . omeprazole (PRILOSEC) 20 MG capsule TAKE ONE CAPSULE BY MOUTH TWICE A DAY 60 capsule 5  . Probiotic Product (PROBIOTIC FORMULA PO) Take 1 capsule by mouth 2 (two) times daily.     . traZODone (DESYREL) 150 MG tablet Take 150 mg by mouth at bedtime.     . triamcinolone (NASACORT ALLERGY 24HR) 55 MCG/ACT AERO nasal inhaler Place 2 sprays into the nose daily.    . fluconazole (DIFLUCAN) 150 MG tablet Take 1 tablet (150 mg total) by mouth once for 1 dose. Can take additional dose three days later if symptoms persist 1 tablet 3  . levonorgestrel (MIRENA) 20 MCG/24HR IUD 1 Intra Uterine Device (1 each total) by Intrauterine route once for 1 dose. 1 each 0  . Melatonin 3 MG TABS TK 1 T PO HS PRN    . meloxicam (MOBIC) 15 MG tablet Take 15 mg by mouth daily.     No current facility-administered medications for this visit.      Review of Systems  Constitutional: Negative.   HENT: Negative.   Eyes: Negative.   Respiratory: Negative.   Cardiovascular: Negative.   Gastrointestinal: Negative.   Genitourinary:       Vaginal itching and discharge  Musculoskeletal: Negative.   Skin: Negative.   Neurological: Negative.   Endo/Heme/Allergies: Negative.   Psychiatric/Behavioral: Negative.         Objective:  Objective   Vitals:   12/08/18 1628  BP: 118/69  Pulse: 61  Weight: 149 lb (67.6 kg)  Height: 5' 2.5" (1.588 m)   Body mass index is 26.82 kg/m. Constitutional: Well nourished, well developed female in no acute distress.  HEENT: normal Skin: Warm and dry.  Respiratory:  Normal respiratory effort Neuro: DTRs 2+, Cranial nerves grossly intact Psych: Alert and Oriented x3. No memory deficits. Normal mood and affect.  MS: normal gait, normal bilateral lower extremity ROM/strength/stability.  Pelvic exam:  is not limited by body habitus EGBUS: within normal limits Vagina: within normal limits and with normal mucosa, scant discharge  Cervix: not evaluated      Assessment/Plan:     48 yo G3 P73 female with possible yeast vaginitis  Aptima swab collected for vaginitis evaluation: follow up with lab results Rx Diflucan sent to patient's pharmacy Return to clinic as needed for worsening symptoms Vaginitis: Comfort and preventive measures reviewed   [facility] 12/09/2018, 1:13 PM

## 2018-12-12 LAB — CERVICOVAGINAL ANCILLARY ONLY
Bacterial Vaginitis (gardnerella): NEGATIVE
Candida Glabrata: NEGATIVE
Candida Vaginitis: NEGATIVE
Comment: NEGATIVE
Comment: NEGATIVE
Comment: NEGATIVE

## 2018-12-27 DIAGNOSIS — H5712 Ocular pain, left eye: Secondary | ICD-10-CM | POA: Diagnosis not present

## 2019-03-09 ENCOUNTER — Telehealth: Payer: Self-pay | Admitting: Internal Medicine

## 2019-03-09 NOTE — Telephone Encounter (Signed)
Pt said she needs a physical by the March 31st. I informed her I would have to see if she could be worked in. She states that for her insurance she has to have this done. Please advise.

## 2019-03-10 NOTE — Telephone Encounter (Signed)
LMTCB

## 2019-03-23 NOTE — Progress Notes (Signed)
Gynecology Annual Exam  PCP: [name], MD  Chief Complaint  Patient presents with  . Gynecologic Exam    wondering about menopause; the last month has been flat whooped; irritation c sex;  wants liver panel, hormones, everything done.    History of Present Illness:  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  She is amenorrheic due to her IUD (Mirena), but may have occasional spotting  She may have vasomotor sx-gets hot then cold  She is single partner, contraception - Mirena IUD. Her Mirena was replaced 04/27/2018. She does have vulvovaginal irritation and swelling and discomfort with and after IC for years, but it has worsened over the last 3 months. Lubricants seem to make it worse.   Last Pap: 04/27/2018, ASCUS, HPV negative Hx of STDs: none  Last mammogram: never had. Would like to schedule There is no FH of breast cancer. There is no FH of ovarian cancer. The patient does do self-breast exams.  Colonoscopy: no DEXA: has not been screened for osteoporosis  Tobacco use: The patient denies current or previous tobacco use. Alcohol use: social drinker. Drinks 4-5 drinks on the weekend Exercise: somewhat limited by shoulder, knee and back from from past injuries. Does the recombent bike and uses the [employer] for strength training.   The patient wears seatbelts: yes.     Past Medical History:  Diagnosis Date  . Anxiety   . Enchondroma, multiple   . GERD (gastroesophageal reflux disease)   . IUD    [facility]  Dr Kennon Rounds  . Migraine headache    sinus related  . Motion sickness    cars - long trips  . MVP (mitral valve prolapse)    Saw cardiology for syncope 7/16.  Marland Kitchen PONV (postoperative nausea and vomiting)   . Pyloric stenosis infant   Inguinal Hernia repair  . Screening for cervical cancer April 2012   normal PAP   . Wears contact lenses     Past Surgical History:  Procedure  Laterality Date  . 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 CESAREAN SECTION     2003 and 2004  . HERNIA REPAIR  1976  . IUD REMOVAL N/A 05/17/2013   Procedure: INTRAUTERINE DEVICE (IUD) REMOVAL;  Surgeon: [name], MD;  Location: [facility];  Service: Gynecology;  Laterality: N/A;  Replacement of IUD  . PYLOROPLASTY  1974  . TOE SURGERY Left 2004   Enchondroma removal - great toe  . TONSILLECTOMY Bilateral 05/16/2015   Procedure: TONSILLECTOMY;  Surgeon: Margaretha Sheffield, MD;  Location: [facility];  Service: ENT;  Laterality: Bilateral;  Latex sensitivity    Medications:  Current Outpatient Medications:  .  Cholecalciferol (VITAMIN D3) 25 MCG (1000 UT) CAPS, Take 1 capsule by mouth daily., Disp: , Rfl:  .  FA-Cyanocobalamin-B6-D-Ca-Aloe (D 1000 PLUS ALOE) TABS, Take 1 tablet by mouth daily., Disp: , Rfl:  .  fexofenadine (ALLEGRA) 180 MG tablet, Take 180 mg by mouth daily., Disp: , Rfl:  .  gabapentin (NEURONTIN) 300 MG capsule, Take 1 capsule by mouth at bedtime., Disp: , Rfl:  .  ketotifen (ZADITOR) 0.025 % ophthalmic solution, Place 1 drop into both eyes 2 (two) times daily.  , Disp: , Rfl:  .  lamoTRIgine (LAMICTAL) 200 MG tablet, TK 1 T PO D, Disp: , Rfl: 0 .  levonorgestrel (MIRENA) 20 MCG/24HR IUD, 1 Intra Uterine Device (1 each total) by Intrauterine route once for 1 dose., Disp: 1 each, Rfl: 0 .  Melatonin 3 MG TABS, TK 1 T PO HS PRN, Disp: , Rfl:  .  meloxicam (MOBIC) 15 MG tablet, Take 15 mg by mouth daily., Disp: , Rfl:  .  omeprazole (PRILOSEC) 20 MG capsule, TAKE ONE CAPSULE BY MOUTH TWICE A DAY, Disp: 60 capsule, Rfl: 5 .  Probiotic Product (PROBIOTIC FORMULA PO), Take 1 capsule by mouth 2 (two) times daily. , Disp: , Rfl:  .  traZODone (DESYREL) 100 MG tablet, Take 2 tablets by mouth daily., Disp: , Rfl:  .  triamcinolone (NASACORT ALLERGY 24HR) 55 MCG/ACT AERO nasal inhaler, Place 2 sprays into the  nose daily., Disp: , Rfl:  .  Turmeric 500 MG CAPS, Take 1 capsule by mouth daily., Disp: , Rfl:   Allergies  Allergen Reactions  . Codeine Sulfate Itching and Nausea And Vomiting    Says all narcotics cause itching and she tries to avoid  . Sulfa Antibiotics Hives    Hives and fever at age of 43, has not had since then  . Tape Itching    whelts  . Latex Rash    Not severe, but avoids gloves  condoms    Gynecologic History:  No LMP recorded (lmp unknown). (Menstrual status: IUD). Contraception: IUD  Obstetric History: [id number], s/p C-section x 2  Family History  Problem Relation Age of Onset  . Cancer Father        adenocarcinoma, nonsmoker  . COPD Mother   . Diabetes Mother   . Hypertension Mother   . Cancer Maternal Grandfather        lung  . BRCA 1/2 Paternal Grandmother   . Cancer Paternal Grandfather        lung    Social History   Socioeconomic History  . Marital status: Married    Spouse name: Not on file  . Number of children: 2  . Years of education: Not on file  . Highest education level: Not on file  Occupational History  . Occupation: physical therapist    Employer: [employer]    Comment: Physical Therapist  Tobacco Use  . Smoking status: Never Smoker  . Smokeless tobacco: Never Used  Substance and Sexual Activity  . Alcohol use: Yes    Comment: 4-5 drinks on weekends  . Drug use: No  . Sexual activity: Yes    Birth control/protection: I.U.D.  Other Topics Concern  . Not on file  Social History Narrative  . Not on file   Social Determinants of Health   Financial Resource Strain:   . Difficulty of Paying Living Expenses:   Food Insecurity:   . Worried About Charity fundraiser in the Last Year:   . Arboriculturist in the Last Year:   Transportation Needs:   . Film/video editor (Medical):   Marland Kitchen Lack of Transportation (Non-Medical):   Physical Activity:   . Days of Exercise per Week:   . Minutes of Exercise per Session:   Stress:    . Feeling of Stress :   Social Connections:   . Frequency of Communication with Friends and Family:   . Frequency of Social Gatherings with Friends and Family:   . Attends Religious Services:   . Active Member of Clubs or Organizations:   . Attends Archivist Meetings:   Marland Kitchen Marital Status:   Intimate  Partner Violence:   . Fear of Current or Ex-Partner:   . Emotionally Abused:   Marland Kitchen Physically Abused:   . Sexually Abused:     Review of Systems  Constitutional: Positive for malaise/fatigue.  HENT: Positive for congestion.   Eyes: Negative.   Respiratory: Negative.   Cardiovascular: Negative.   Gastrointestinal: Positive for constipation.  Genitourinary: Negative.        Positive for painful intercourse  Musculoskeletal: Positive for joint pain.  Skin: Negative.   Neurological: Negative.   Endo/Heme/Allergies: Positive for environmental allergies.       Positive for hot and cold flashes  Psychiatric/Behavioral: Positive for depression. The patient is nervous/anxious.      Physical Exam BP 100/60   Pulse 66   Ht 5' 2.5" (1.588 m)   Wt 146 lb (66.2 kg)   LMP  (LMP Unknown)   BMI 26.28 kg/m   Physical Exam Constitutional:      General: She is not in acute distress.    Appearance: She is well-developed.  Genitourinary:     Pelvic exam was performed with patient supine.     Genitourinary Comments: Vulva: no inflammation or lesions  Vagina: tenderness at introitus/ hymenal area when inserting examining fingers Scant clear discharge Cervix: IUD strings present, no polyps or bleeding, NT Uterus: AV, NSSC, mobile, NT Adnexa: no masses, NT  HENT:     Head: Normocephalic and atraumatic.  Eyes:     General: No scleral icterus. Neck:     Thyroid: No thyromegaly.  Cardiovascular:     Rate and Rhythm: Normal rate and regular rhythm.     Heart sounds: No murmur.  Pulmonary:     Effort: Pulmonary effort is normal. No respiratory distress.     Breath sounds: Normal  breath sounds. No wheezing or rales.  Chest:     Comments: Breasts: soft, no masses, no nipple or skin changes, no nipple discharge.  Abdominal:     General: There is no distension.     Palpations: Abdomen is soft. There is no mass.     Tenderness: There is no abdominal tenderness. There is no guarding or rebound.  Musculoskeletal:        General: Normal range of motion.     Cervical back: Normal range of motion and neck supple.  Lymphadenopathy:     Cervical: No cervical adenopathy.     Upper Body:     Right upper body: No supraclavicular or axillary adenopathy.     Left upper body: No supraclavicular or axillary adenopathy.     Lower Body: No right inguinal adenopathy. No left inguinal adenopathy.  Neurological:     Mental Status: She is alert and oriented to person, place, and time.     Cranial Nerves: No cranial nerve deficit.  Skin:    General: Skin is warm and dry.     Findings: No erythema.  Psychiatric:        Behavior: Behavior normal.        Judgment: Judgment normal.   wet prep was negative for hyphae, Trichimonas and clue cells   Assessment: 49 y.o. [id number] with normal annual exam Dyspareunia-possible due to pelvic floor pain Amenorrhea with Mirena. ?vasomotor symptoms  R/O menopause ASCUS PAP with negative HRHPV last year Plan: Problem List Items Addressed This Visit    None    Visit Diagnoses    Screening for diabetes mellitus    -  Primary   Relevant Orders   Hemoglobin A1c (Completed)  Screening for hyperlipidemia       Relevant Orders   Lipid Panel With LDL/HDL Ratio (Completed)   Amenorrhea       Relevant Orders   FSH/LH (Completed)   Atypical squamous cell changes of undetermined significance (ASCUS) on cervical cytology with negative high risk human papilloma virus (HPV) test result       Relevant Orders   Cytology - PAP   Screening for cervical cancer       Relevant Orders   Cytology - PAP   Encounter for screening for malignant neoplasm of  breast, unspecified screening modality       Relevant Orders   MM 3D SCREEN BREAST BILATERAL   Dyspareunia in female       Relevant Orders   Ambulatory referral to Physical Therapy   Encounter for gynecological examination          Screening: -- Blood pressure screen normal -- Colonoscopy - not due -- Mammogram - due. Patient to call [facility] to arrange. She understands that it is her responsibility to arrange this. -- Weight screening: normal -- Nutrition: normal -- cholesterol screening: ordered -- osteoporosis screening: not due -- tobacco screening: not using -- family history of breast cancer screening: done. not at high risk. -- no evidence of domestic violence or intimate partner violence -- pap smear collected    Dalia Heading, [place]

## 2019-03-24 ENCOUNTER — Other Ambulatory Visit: Payer: Self-pay

## 2019-03-24 ENCOUNTER — Encounter: Payer: Self-pay | Admitting: Certified Nurse Midwife

## 2019-03-24 ENCOUNTER — Ambulatory Visit (INDEPENDENT_AMBULATORY_CARE_PROVIDER_SITE_OTHER): Payer: PRIVATE HEALTH INSURANCE | Admitting: Certified Nurse Midwife

## 2019-03-24 ENCOUNTER — Other Ambulatory Visit (HOSPITAL_COMMUNITY)
Admission: RE | Admit: 2019-03-24 | Discharge: 2019-03-24 | Disposition: A | Discharge status: Home / Self Care | Payer: No Typology Code available for payment source | Source: Ambulatory Visit | Attending: Certified Nurse Midwife | Admitting: Certified Nurse Midwife

## 2019-03-24 VITALS — BP 100/60 | HR 66 | Ht 62.5 in | Wt 146.0 lb

## 2019-03-24 DIAGNOSIS — Z01419 Encounter for gynecological examination (general) (routine) without abnormal findings: Secondary | ICD-10-CM | POA: Diagnosis not present

## 2019-03-24 DIAGNOSIS — R87610 Atypical squamous cells of undetermined significance on cytologic smear of cervix (ASC-US): Secondary | ICD-10-CM

## 2019-03-24 DIAGNOSIS — F319 Bipolar disorder, unspecified: Secondary | ICD-10-CM

## 2019-03-24 DIAGNOSIS — N941 Unspecified dyspareunia: Secondary | ICD-10-CM | POA: Diagnosis not present

## 2019-03-24 DIAGNOSIS — Z975 Presence of (intrauterine) contraceptive device: Secondary | ICD-10-CM

## 2019-03-24 DIAGNOSIS — Z1239 Encounter for other screening for malignant neoplasm of breast: Secondary | ICD-10-CM

## 2019-03-24 DIAGNOSIS — Z131 Encounter for screening for diabetes mellitus: Secondary | ICD-10-CM

## 2019-03-24 DIAGNOSIS — Z124 Encounter for screening for malignant neoplasm of cervix: Secondary | ICD-10-CM | POA: Diagnosis present

## 2019-03-24 DIAGNOSIS — Z13220 Encounter for screening for lipoid disorders: Secondary | ICD-10-CM

## 2019-03-24 DIAGNOSIS — N898 Other specified noninflammatory disorders of vagina: Secondary | ICD-10-CM

## 2019-03-24 DIAGNOSIS — N912 Amenorrhea, unspecified: Secondary | ICD-10-CM

## 2019-03-25 LAB — LIPID PANEL WITH LDL/HDL RATIO
Cholesterol, Total: 216 mg/dL — ABNORMAL HIGH (ref 100–199)
HDL: 89 mg/dL (ref >39)
LDL Chol Calc (NIH): 114 mg/dL — ABNORMAL HIGH (ref 0–99)
LDL/HDL Ratio: 1.3 ratio (ref 0.0–3.2)
Triglycerides: 73 mg/dL (ref 0–149)
VLDL Cholesterol Cal: 13 mg/dL (ref 5–40)

## 2019-03-25 LAB — FSH/LH
FSH: 6.5 m[IU]/mL
LH: 9.3 m[IU]/mL

## 2019-03-25 LAB — HEMOGLOBIN A1C
Est. average glucose Bld gHb Est-mCnc: 100 mg/dL
Hgb A1c MFr Bld: 5.1 % (ref 4.8–5.6)

## 2019-03-26 ENCOUNTER — Encounter: Payer: Self-pay | Admitting: Certified Nurse Midwife

## 2019-03-26 DIAGNOSIS — N941 Unspecified dyspareunia: Secondary | ICD-10-CM | POA: Insufficient documentation

## 2019-03-26 DIAGNOSIS — F319 Bipolar disorder, unspecified: Secondary | ICD-10-CM | POA: Insufficient documentation

## 2019-03-26 LAB — POCT WET PREP (WET MOUNT): Trichomonas Wet Prep HPF POC: ABSENT

## 2019-03-30 ENCOUNTER — Telehealth: Payer: Self-pay

## 2019-03-30 NOTE — Telephone Encounter (Signed)
LMVM to notify her daughter can be seen here. We see patients beginning at age 47. Advised to contact us to scheduled. Will fwd msg to [facility] to contact her regarding Pelvic Floor Therapy information

## 2019-03-30 NOTE — Telephone Encounter (Signed)
Patient saw CLG 03/24/2019 & was being referred for Pelvic Floor Therapy. She believes it was at [facility], she's trying to get the info for that and the person to call. She also has question about youngest daughter who is about to be 18 and hasn't had a period in about a year. She was on Acutane. She is inquiring if she can be seen here. HO#[phone]. Ok to leave detailed message.

## 2019-04-03 LAB — CYTOLOGY - PAP: High risk HPV: NEGATIVE

## 2019-04-04 NOTE — Progress Notes (Signed)
Patient called with Pap smear results. Pap was LGSIL with negative HRHPV. Given options to repeat in one year or to have colposcopy. Desires colposcopy. Will schedule with Dr Jean Rosenthal.

## 2019-04-06 NOTE — Telephone Encounter (Signed)
LMTCB

## 2019-04-10 ENCOUNTER — Inpatient Hospital Stay: Admission: RE | Admit: 2019-04-10 | Payer: No Typology Code available for payment source | Source: Ambulatory Visit

## 2019-04-24 ENCOUNTER — Encounter: Payer: Self-pay | Admitting: Obstetrics and Gynecology

## 2019-04-24 ENCOUNTER — Other Ambulatory Visit: Payer: Self-pay

## 2019-04-24 ENCOUNTER — Other Ambulatory Visit (HOSPITAL_COMMUNITY)
Admission: RE | Admit: 2019-04-24 | Discharge: 2019-04-24 | Disposition: A | Discharge status: Home / Self Care | Payer: PRIVATE HEALTH INSURANCE | Source: Ambulatory Visit | Attending: Obstetrics and Gynecology | Admitting: Obstetrics and Gynecology

## 2019-04-24 ENCOUNTER — Ambulatory Visit (INDEPENDENT_AMBULATORY_CARE_PROVIDER_SITE_OTHER): Payer: PRIVATE HEALTH INSURANCE | Admitting: Obstetrics and Gynecology

## 2019-04-24 VITALS — BP 118/76 | Ht 62.0 in | Wt 149.0 lb

## 2019-04-24 DIAGNOSIS — R87612 Low grade squamous intraepithelial lesion on cytologic smear of cervix (LGSIL): Secondary | ICD-10-CM | POA: Diagnosis not present

## 2019-04-24 NOTE — Progress Notes (Signed)
HPI:  Carolyn Clements is a 49 y.o.  [id number]  who presents today for evaluation and management of abnormal cervical cytology.    Dysplasia History:  03/24/19: LGSIL, HPV negative 04/28/18: ASCUS, HPV negative, Colposcopy - normal 06/24/2016: ASCUS, HPV negative  OB History  Gravida Para Term Preterm AB Living  '3 2 2   1 2  '$ SAB TAB Ectopic Multiple Live Births  1       2    # Outcome Date GA Lbr Len/2nd Weight Sex Delivery Anes PTL Lv  3 Term 2005 [redacted]w[redacted]d  F CS-LTranv        Birth Comments: ERCS  2 Term 2003 441w0d F CS-LTranv        Birth Comments: Emergent CS  1 SAB 2002 1032w0d         Birth Comments: D&C    Past Medical History:  Diagnosis Date  . Anxiety   . B[place] 1 disorder ([place] . Enchondroma, multiple   . GERD (gastroesophageal reflux disease)   . IUD    [facility] PraKennon Rounds Migraine headache    sinus related  . Motion sickness    cars - long trips  . MVP (mitral valve prolapse)    Saw cardiology for syncope 7/16.  . PMarland KitchenNV (postoperative nausea and vomiting)   . Pyloric stenosis infant   Inguinal Hernia repair  . Screening for cervical cancer April 2012   normal PAP   . Wears contact lenses     Past Surgical History:  Procedure Laterality Date  . ARTHROSCOPIC REPAIR ACL     torn/repaired ACL, followed by patellar fractuer, high school socPrint production planner CESAREAN SECTION     2003 and 2004  . HERNIA REPAIR  1976  . IUD REMOVAL N/A 05/17/2013   Procedure: INTRAUTERINE DEVICE (IUD) REMOVAL;  Surgeon: TanDonnamae JudeD;  [facility];  Service: Gynecology;  Laterality: N/A;  Replacement of IUD  . PYLOROPLASTY  1974  . TOE SURGERY Left 2004   Enchondroma removal - great toe  . TONSILLECTOMY Bilateral 05/16/2015   Procedure: TONSILLECTOMY;  Surgeon: PauMargaretha SheffieldD;  [facility];  Laterality: Bilateral;  Latex sensitivity    SOCIAL HISTORY:  Social History   Substance and Sexual Activity    Alcohol Use Yes   Comment: 4-5 drinks on weekends    Social History   Substance and Sexual Activity  Drug Use No     Family History  Problem Relation Age of Onset  . Cancer Father        adenocarcinoma, nonsmoker  . COPD Mother   . Diabetes Mother   . Hypertension Mother   . Cancer Maternal Grandfather        lung  . BRCA 1/2 Paternal Grandmother   . Cancer Paternal Grandfather        lung    ALLERGIES:  Codeine sulfate, Sulfa antibiotics, Tape, and Latex  Current Outpatient Medications on File Prior to Visit  Medication Sig Dispense Refill  . Cholecalciferol (VITAMIN D3) 25 MCG (1000 UT) CAPS Take 1 capsule by mouth daily.    . FMarland Kitchen-Cyanocobalamin-B6-D-Ca-Aloe (D 1000 PLUS ALOE) TABS Take 1 tablet by mouth daily.    . fexofenadine (ALLEGRA) 180 MG tablet Take 180 mg by mouth daily.    . gMarland Kitchenbapentin (NEURONTIN) 300 MG capsule Take 1 capsule by mouth at bedtime.    .Marland Kitchen  ketotifen (ZADITOR) 0.025 % ophthalmic solution Place 1 drop into both eyes 2 (two) times daily.      Marland Kitchen lamoTRIgine (LAMICTAL) 200 MG tablet TK 1 T PO D  0  . levonorgestrel (MIRENA) 20 MCG/24HR IUD 1 Intra Uterine Device (1 each total) by Intrauterine route once for 1 dose. 1 each 0  . Melatonin 3 MG TABS TK 1 T PO HS PRN    . meloxicam (MOBIC) 15 MG tablet Take 15 mg by mouth daily.    Marland Kitchen omeprazole (PRILOSEC) 20 MG capsule TAKE ONE CAPSULE BY MOUTH TWICE A DAY 60 capsule 5  . Probiotic Product (PROBIOTIC FORMULA PO) Take 1 capsule by mouth 2 (two) times daily.     . traZODone (DESYREL) 100 MG tablet Take 2 tablets by mouth daily.    Marland Kitchen triamcinolone (NASACORT ALLERGY 24HR) 55 MCG/ACT AERO nasal inhaler Place 2 sprays into the nose daily.    . Turmeric 500 MG CAPS Take 1 capsule by mouth daily.     No current facility-administered medications on file prior to visit.    Physical Exam: -Vitals:  BP 118/76   Ht '5\' 2"'$  (1.575 m)   Wt 149 lb (67.6 kg)   BMI 27.25 kg/m  GEN: WD, WN, NAD.  A+ O x 3, good mood  and affect. ABD:  NT, ND.  Soft, no masses.  No hernias noted.   Pelvic:   Vulva: Normal appearance.  No lesions.  Vagina: No lesions or abnormalities noted.  Support: Normal pelvic support.  Urethra No masses tenderness or scarring.  Meatus Normal size without lesions or prolapse.  Cervix: See below.  Anus: Normal exam.  No lesions.  Perineum: Normal exam.  No lesions.        Bimanual   Uterus: Normal size.  Non-tender.  Mobile.  AV.  Adnexae: No masses.  Non-tender to palpation.  Cul-de-sac: Negative for abnormality.   PROCEDURE: 1.  Urine Pregnancy Test:  not done due to IUD 2.  Colposcopy performed with 4% acetic acid after verbal consent obtained                                         -Aceto-white Lesions Location(s): left sided in general.              -Biopsy performed at 1 and 5 o'clock               -ECC indicated and performed: Yes.       -Biopsy sites made hemostatic with pressure, AgNO3, and/or Monsel's solution   -Satisfactory colposcopy: No.    -Evidence of Invasive cervical CA :  NO  - note: IUD strings visualized  ASSESSMENT:  Carolyn Clements is a 48 y.o. [id number] here for  1. LGSIL on Pap smear of cervix   .  PLAN:  I discussed the grading system of pap smears and HPV high risk viral types.  We will discuss and base management after colpo results return.     Prentice Docker, MD  [facility], [facility] 04/24/2019  2:24 PM

## 2019-04-25 ENCOUNTER — Telehealth: Payer: Self-pay

## 2019-04-25 NOTE — Telephone Encounter (Signed)
Spoke with pt. Advised her this can br normal. Also advised her not to stick anything in the vagina for 2 weeks and to give it time to heal. She will call back with any further questions

## 2019-04-25 NOTE — Telephone Encounter (Signed)
Pt had colpo yesterday and reports her vagina is closing she can barely get a finger in there with out hurting, it does not feel smooth and had red pieces coming out that looks like a tomato also reports a orange color. I advises her that the color is from the monsels. She states she has had 2 colpos before and this has never happened. Please advise

## 2019-04-26 LAB — SURGICAL PATHOLOGY

## 2019-05-04 ENCOUNTER — Telehealth: Payer: Self-pay

## 2019-05-04 ENCOUNTER — Telehealth: Payer: Self-pay | Admitting: Obstetrics and Gynecology

## 2019-05-04 NOTE — Telephone Encounter (Signed)
Left generic VM to call me back 

## 2019-05-04 NOTE — Telephone Encounter (Signed)
Pt calling for colpo results  

## 2019-05-04 NOTE — Telephone Encounter (Signed)
Discussed normal findings on colposcopy.  I do recommend a Pap smear in 1 year.  If that is normal, it may be reasonable to have her not undergo a colposcopy, if the HPV result is negative.  We will follow-up in 1 year and discuss, as needed.  She voiced agreement with this plan and all questions were answered.

## 2019-05-08 ENCOUNTER — Ambulatory Visit: Payer: PRIVATE HEALTH INSURANCE | Attending: Certified Nurse Midwife | Admitting: Physical Therapy

## 2019-05-08 DIAGNOSIS — R102 Pelvic and perineal pain: Secondary | ICD-10-CM | POA: Insufficient documentation

## 2019-05-08 DIAGNOSIS — R278 Other lack of coordination: Secondary | ICD-10-CM | POA: Insufficient documentation

## 2019-05-08 DIAGNOSIS — M62838 Other muscle spasm: Secondary | ICD-10-CM | POA: Insufficient documentation

## 2019-05-15 ENCOUNTER — Encounter: Payer: Self-pay | Admitting: Physical Therapy

## 2019-05-15 ENCOUNTER — Other Ambulatory Visit: Payer: Self-pay

## 2019-05-15 ENCOUNTER — Ambulatory Visit: Payer: PRIVATE HEALTH INSURANCE | Admitting: Physical Therapy

## 2019-05-15 DIAGNOSIS — R102 Pelvic and perineal pain: Secondary | ICD-10-CM

## 2019-05-15 DIAGNOSIS — M62838 Other muscle spasm: Secondary | ICD-10-CM

## 2019-05-15 DIAGNOSIS — R278 Other lack of coordination: Secondary | ICD-10-CM | POA: Diagnosis present

## 2019-05-15 NOTE — Therapy (Signed)
[facility] [address] [place], [place], [address] Phone: [phone]   Fax:  [phone]  Physical Therapy Evaluation  Patient Details  Name: Carolyn Clements MRN: [id number] Date of Birth: 08-03-1970 Referring Provider (PT): Farrel Conners   Encounter Date: 05/15/2019  PT End of Session - 05/15/19 1655    Visit Number  1    Number of Visits  12    Date for PT Re-Evaluation  08/07/19    PT Start Time  1658    PT Stop Time  1750    PT Time Calculation (min)  52 min    Activity Tolerance  Patient tolerated treatment well    Behavior During Therapy  [facility] for tasks assessed/performed       Past Medical History:  Diagnosis Date  . Anxiety   . Bipolar 1 disorder (HCC)   . Enchondroma, multiple   . GERD (gastroesophageal reflux disease)   . IUD    [facility] for [facility]  Dr Shawnie Pons  . Migraine headache    sinus related  . Motion sickness    cars - long trips  . MVP (mitral valve prolapse)    Saw cardiology for syncope 7/16.  Marland Kitchen PONV (postoperative nausea and vomiting)   . Pyloric stenosis infant   Inguinal Hernia repair  . Screening for cervical cancer April 2012   normal PAP   . Wears contact lenses     Past Surgical History:  Procedure Laterality Date  . 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 CESAREAN SECTION     2003 and 2004  . HERNIA REPAIR  1976  . IUD REMOVAL N/A 05/17/2013   Procedure: INTRAUTERINE DEVICE (IUD) REMOVAL;  Surgeon: Reva Bores, MD;  Location: [facility];  Service: Gynecology;  Laterality: N/A;  Replacement of IUD  . PYLOROPLASTY  1974  . TOE SURGERY Left 2004   Enchondroma removal - great toe  . TONSILLECTOMY Bilateral 05/16/2015   Procedure: TONSILLECTOMY;  Surgeon: Vernie Murders, MD;  Location: [facility];  Service: ENT;  Laterality: Bilateral;  Latex sensitivity    There were no vitals filed for this  visit.    [facility] PT Assessment - 05/15/19 1654      Assessment   Medical Diagnosis  Dyspareunia    Referring Provider (PT)  Farrel Conners    Prior Therapy  None for this dx       PELVIC HEALTH PHYSICAL THERAPY EVALUATION  SCREENING Red Flags: None Have you had any night sweats? Unexplained weight loss? Saddle anesthesia? Unexplained changes in bowel or bladder habits?  Precautions: None  SUBJECTIVE  Chief Complaint: Patient notes that her pelvic pain does not feel like a muscle soreness. She describes it as irritated and even with use of lubricant. Patient notes minimal discomfort during intercourse with more discomfort for the next 24 hours after with irritation, sensation of tearing afterwards. Constipation has been a lifelong issue. Patient notes inability to hydrate 2/2 to work environment. Patient also notes decreased interest in water.   Pertinent History:  Falls Negative.  Scoliosis Negative. Pulmonary disease/dysfunction Positive for intense allergies. Surgical history: Positive for multiple abdominal surgeries.   Obstetrical History: G3P2 Deliveries: C-section x2  Gynecological History: Hysterectomy: No  Endometriosis: Negative Last Menstrual Period: mirena IUD placed Pain with exam: No   Urinary History: Incontinence: Negative. Nocturia: 0-1x/night Frequency of urination: 4-5x/day Pain with urination: Negative Difficulty  initiating urination: Positive for occasional-rare with public restrooms mostly. Frequent UTI: Negative.   Gastrointestinal History: [place] Stool Chart: Type 1 (Type 4 Frequency of BMs: 1x/day Pain with defecation: Positive for lower abdominal pain. Straining with defecation: Positive Incontinence: Negative.    Sexual activity/pain: Pain with intercourse: Positive for greatest pain after.   Initial penetration: No  Deep thrustingNo   Location of pain: vaginally (4-7 oclock) Current pain:  0/10  Max pain:  1/10 Least  pain:  0/10 Pain quality: pain quality: burning and irritating and tearing Radiating pain: No    Patient assessment of present state: Increased tone  Current activities:  Gym, cardio  Patient Goals:  I would love to not have pelvic discomfort. I'd like to be not constipated anymore.  Patient perception of overall health: Great  OBJECTIVE  Mental Status Patient is oriented to person, place and time.  Recent memory is intact.  Remote memory is intact.  Attention span and concentration are intact.  Expressive speech is intact.  Patient's fund of knowledge is within normal limits for educational level.  POSTURE/OBSERVATIONS:  Lumbar lordosis: WNL Iliac crest height: L elevated slightly Lumbar lateral shift: negative Pelvic obliquity: minimal L obliquiity  GAIT: WNL  RANGE OF MOTION:    LEFT RIGHT  Lumbar forward flexion (65):  WNL    Lumbar extension (30): WNL    Lumbar lateral flexion (25):  WNL* (discomfort at SIJ) WNL* (discomfort at L QL)  Thoracic and Lumbar rotation (30 degrees):    WNL WNL  Hip Flexion (0-125):   WNL WNL  Hip IR (0-45):  WNL WNL  Hip ER (0-45):  WNL WNL  Hip Abduction (0-40):  Mildly limited 2/2 to adductor tension Mildly limited 2/2 to adductor tension  Hip extension (0-15):  WNL WNL    SENSATION: Grossly intact to light touch bilateral LEs as determined by testing dermatomes L2-S2 Proprioception and hot/cold testing deferred on this date  STRENGTH: MMT   RLE LLE  Hip Flexion 5 5  Hip Extension 5 5  Hip Abduction  5 5  Hip Adduction  5 5  Hip ER  5 5  Hip IR  5 5  Knee Extension 5 5  Knee Flexion 5 5  Dorsiflexion  5 5  Plantarflexion (seated) 5 5   ABDOMINAL:  Palpation: no TTP Diastasis: WNL Rib flare: present B  SPECIAL TESTS: Stork/March (SP 93): R: Negative L: Positive  PHYSICAL PERFORMANCE MEASURES: STS: WNL    EXTERNAL PELVIC EXAM: deferred 2/2 to time constraints Palpation: Breath coordination: Cued  Lengthen: Cued Contraction: Cough:  INTERNAL VAGINAL EXAM: deferred 2/2 to time constraints Introitus Appears:  Skin integrity:  Scar mobility: Strength (PERF):  Symmetry: Palpation: Prolapse:   INTERNAL RECTAL EXAM: deferred 2/2 to time constraints Strength (PERF): Symmetry: Palpation: Prolapse:   OUTCOME MEASURES: FOTO (Bowel Constipation 55)   ASSESSMENT Patient is a 49 year old presenting to clinic with chief complaints of vaginal pain s/p penetration and persistent constipation. Upon examination, patient demonstrates deficits in posture, pain free spinal ROM, PFM coordination, PFM extensibility, IAP management as evidenced by straining with BM, sensation of incomplete bladder emptying, elevated L IC, L pelvic obliquity, 24 hour pain cycle after penetration. Patient's responses on FOTO outcome measures (Bowel Constipation 55) indicate moderate functional limitations/disability/distress. Patient's progress may be limited due to persistence of issue; however, patient's motivation and health literacy are advantageous. Patient was able to achieve basic understanding of PFM response to tension and diaphragmatic breathing during today's evaluation  and responded positively to educational interventions. Patient will benefit from continued skilled therapeutic intervention to address deficits in posture, pain free spinal ROM, PFM coordination, PFM extensibility, IAP management in order to increase function and improve overall QOL.  EDUCATION Patient educated on prognosis, POC, and provided with HEP including: diaphragmatic breathing. Patient articulated understanding and returned demonstration. Patient will benefit from further education in order to maximize compliance and understanding for long-term therapeutic gains.  TREATMENT Neuromuscular Re-education: Patient educated on primary functions of the pelvic floor including: posture/balance, sexual pleasure, storage and elimination of waste  from the body, abdominal cavity closure, and breath coordination. Supine hooklying diaphragmatic breathing with VCs and TCs for downregulation of the nervous system and improved management of IAP     Objective measurements completed on examination: See above findings.        PT Long Term Goals - 05/16/19 0844      PT LONG TERM GOAL #1   Title  Patient will demonstrate independence with HEP in order to maximize therapeutic gains and improve carryover from physical therapy sessions to ADLs in the home and community.    Baseline  IE: not demonstrated    Time  12    Period  Weeks    Status  New    Target Date  08/07/19      PT LONG TERM GOAL #2   Title  Patient will demonstrate independent and coordinated diaphragmatic breathing in supine with a 1:2 breathing pattern for improved down-regulation of the nervous system and improved management of intra-abdominal pressures in order to increase function at home and in the community.    Baseline  IE: not demonstrated    Time  12    Period  Weeks    Status  New    Target Date  08/07/19      PT LONG TERM GOAL #3   Title  Patient will demonstrate improved function as evidenced by a score of 64 on FOTO measure for full participation in activities at home and in the community.    Baseline  IE: 55    Time  12    Period  Weeks    Status  New    Target Date  08/07/19      PT LONG TERM GOAL #4   Title  Patient will demonstrate improved toileting posture with knees higher than hips and feet supported, and will report straining <3x/week with bowel movements in order to decrease incidence of constipation/hemorrhoids/mismanagement of intra-abdominal pressure and improve overall QOL.    Baseline  IE: not demonstrated    Time  12    Period  Weeks    Status  New    Target Date  08/07/19      PT LONG TERM GOAL #5   Title  Patient will demonstrate coordinated lengthening and relaxation of PFM with diaphragmatic inhalation in order to decrease  spasm and allow for unrestricted elimination of urine/feces for improved overall QOL.    Baseline  IE: not demonstrated    Time  12    Period  Weeks    Status  New    Target Date  08/07/19             Plan - 05/15/19 1656    Clinical Impression Statement  Patient is a 49 year old presenting to clinic with chief complaints of vaginal pain s/p penetration and persistent constipation. Upon examination, patient demonstrates deficits in posture, pain free spinal ROM, PFM coordination, PFM extensibility,  IAP management as evidenced by straining with BM, sensation of incomplete bladder emptying, elevated L IC, L pelvic obliquity, 24 hour pain cycle after penetration. Patient's responses on FOTO outcome measures (Bowel Constipation 55) indicate moderate functional limitations/disability/distress. Patient's progress may be limited due to persistence of issue; however, patient's motivation and health literacy are advantageous. Patient was able to achieve basic understanding of PFM response to tension and diaphragmatic breathing during today's evaluation and responded positively to educational interventions. Patient will benefit from continued skilled therapeutic intervention to address deficits in posture, pain free spinal ROM, PFM coordination, PFM extensibility, IAP management in order to increase function and improve overall QOL.    Personal Factors and Comorbidities  Age;Education;Sex;Comorbidity 3+;Past/Current Experience;Time since onset of injury/illness/exacerbation;Fitness;Behavior Pattern    Comorbidities  anxiety, bipolar 1, migraine, GERD, pyloric stenosis,    Examination-Activity Limitations  Continence;Lift;Squat;Locomotion Level;Stairs;Other    Examination-Participation Restrictions  Interpersonal Relationship;Cleaning;Laundry;Community Activity    Stability/Clinical Decision Making  Evolving/Moderate complexity    Clinical Decision Making  Moderate    Rehab Potential  Fair    PT  Frequency  1x / week    PT Duration  12 weeks    PT Treatment/Interventions  Cryotherapy;Biofeedback;Moist Heat;Gait training;Stair training;Functional mobility training;Therapeutic activities;Neuromuscular re-education;Balance training;Therapeutic exercise;Patient/family education;Scar mobilization;Taping;Manual techniques;Passive range of motion;Dry needling;Joint Manipulations;Spinal Manipulations    PT Next Visit Plan  External PFM; PFM relaxation protocol    PT Home Exercise Plan  Diaphragmatic breathing; toileting posture    Consulted and Agree with Plan of Care  Patient       Patient will benefit from skilled therapeutic intervention in order to improve the following deficits and impairments:  Decreased endurance, Decreased mobility, Increased muscle spasms, Pain, Postural dysfunction, Impaired flexibility, Increased fascial restricitons, Decreased strength, Decreased coordination, Decreased activity tolerance, Decreased range of motion, Decreased scar mobility, Impaired tone, Improper body mechanics  Visit Diagnosis: Other muscle spasm  Other lack of coordination  Pelvic pain     Problem List Patient Active Problem List   Diagnosis Date Noted  . Dyspareunia in female 03/26/2019  . Bipolar 1 disorder ([place]) 03/26/2019  . Hemorrhoids 12/20/2014  . Nausea 12/20/2014  . Congenital enchondromatosis 07/03/2014  . Fatigue 11/07/2013  . Anemia, iron deficiency 11/22/2012  . Generalized anxiety disorder 01/07/2012  . GERD (gastroesophageal reflux disease)   . Migraine headache    Myles Gip PT, [place] #[id number] 05/16/2019, 8:47 AM  [facility] [address] [place], [place], [address] Phone: [phone]   Fax:  [phone]  Name: Carolyn Clements MRN: [id number] Date of Birth: [birth year]

## 2019-05-24 ENCOUNTER — Encounter: Payer: No Typology Code available for payment source | Admitting: Physical Therapy

## 2019-05-25 ENCOUNTER — Ambulatory Visit: Payer: PRIVATE HEALTH INSURANCE | Admitting: Physical Therapy

## 2019-05-25 ENCOUNTER — Encounter: Payer: Self-pay | Admitting: Physical Therapy

## 2019-05-25 DIAGNOSIS — M62838 Other muscle spasm: Secondary | ICD-10-CM | POA: Diagnosis not present

## 2019-05-25 DIAGNOSIS — R102 Pelvic and perineal pain: Secondary | ICD-10-CM

## 2019-05-25 DIAGNOSIS — R278 Other lack of coordination: Secondary | ICD-10-CM

## 2019-05-25 NOTE — Therapy (Signed)
[facility] [address] [place], [place], [address] Phone: [phone]   Fax:  [phone]  Physical Therapy Treatment  Patient Details  Name: Carolyn Clements MRN: [id number] Date of Birth: May 13, 1970 Referring Provider (PT): Farrel Conners   Encounter Date: 05/25/2019  PT End of Session - 05/25/19 1834    Visit Number  2    Number of Visits  12    Date for PT Re-Evaluation  08/07/19    PT Start Time  1705    PT Stop Time  1800    PT Time Calculation (min)  55 min    Activity Tolerance  Patient tolerated treatment well    Behavior During Therapy  [facility] for tasks assessed/performed       Past Medical History:  Diagnosis Date  . Anxiety   . Bipolar 1 disorder (HCC)   . Enchondroma, multiple   . GERD (gastroesophageal reflux disease)   . IUD    Center for [facility] health  Dr Shawnie Pons  . Migraine headache    sinus related  . Motion sickness    cars - long trips  . MVP (mitral valve prolapse)    Saw cardiology for syncope 7/16.  Marland Kitchen PONV (postoperative nausea and vomiting)   . Pyloric stenosis infant   Inguinal Hernia repair  . Screening for cervical cancer April 2012   normal PAP   . Wears contact lenses     Past Surgical History:  Procedure Laterality Date  . 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 CESAREAN SECTION     2003 and 2004  . HERNIA REPAIR  1976  . IUD REMOVAL N/A 05/17/2013   Procedure: INTRAUTERINE DEVICE (IUD) REMOVAL;  Surgeon: Reva Bores, MD;  Location: [facility];  Service: Gynecology;  Laterality: N/A;  Replacement of IUD  . PYLOROPLASTY  1974  . TOE SURGERY Left 2004   Enchondroma removal - great toe  . TONSILLECTOMY Bilateral 05/16/2015   Procedure: TONSILLECTOMY;  Surgeon: Vernie Murders, MD;  Location: [facility];  Service: ENT;  Laterality: Bilateral;  Latex sensitivity    There were no vitals filed for this  visit.  Subjective Assessment - 05/25/19 1813    Subjective  Patient presents to clinic with increased stress as a result of life's demands. Patient reporting good practice with HEP.    Currently in Pain?  No/denies      TREATMENT  Neuromuscular Re-education: Supine hooklying diaphragmatic breathing with VCs and TCs for downregulation of the nervous system and improved management of IAP Supine hooklying, PFM lengthening with inhalation. VCs to decrease compensatory patterns and encourage optimal relaxation of the PFM. Supine happy baby with PFM lengthening and coordinated breath.  Patient education on impact of stressors on PFM tension as well as impact of decreased libido on PFM spasm during intercourse. Patient participated in discussion of various strategies for stress/tension management.    Patient educated throughout session on appropriate technique and form using multi-modal cueing, HEP, and activity modification. Patient articulated understanding and returned demonstration.  Patient Response to interventions: Patient notes confidence with additions to home practice.  ASSESSMENT Patient presents to clinic with excellent motivation to participate in therapy. Patient demonstrates deficits in posture, pain free spinal ROM, PFM coordination, PFM extensibility, IAP management. Patient able to achieve exceptional PFM lengthening with coordinated breath during today's session and responded positively to educational interventions. Patient will benefit from continued  skilled therapeutic intervention to address remaining deficits in posture, pain free spinal ROM, PFM coordination, PFM extensibility, IAP management in order to increase function and improve overall QOL.    PT Long Term Goals - 05/16/19 0844      PT LONG TERM GOAL #1   Title  Patient will demonstrate independence with HEP in order to maximize therapeutic gains and improve carryover from physical therapy sessions to ADLs in the  home and community.    Baseline  IE: not demonstrated    Time  12    Period  Weeks    Status  New    Target Date  08/07/19      PT LONG TERM GOAL #2   Title  Patient will demonstrate independent and coordinated diaphragmatic breathing in supine with a 1:2 breathing pattern for improved down-regulation of the nervous system and improved management of intra-abdominal pressures in order to increase function at home and in the community.    Baseline  IE: not demonstrated    Time  12    Period  Weeks    Status  New    Target Date  08/07/19      PT LONG TERM GOAL #3   Title  Patient will demonstrate improved function as evidenced by a score of 64 on FOTO measure for full participation in activities at home and in the community.    Baseline  IE: 55    Time  12    Period  Weeks    Status  New    Target Date  08/07/19      PT LONG TERM GOAL #4   Title  Patient will demonstrate improved toileting posture with knees higher than hips and feet supported, and will report straining <3x/week with bowel movements in order to decrease incidence of constipation/hemorrhoids/mismanagement of intra-abdominal pressure and improve overall QOL.    Baseline  IE: not demonstrated    Time  12    Period  Weeks    Status  New    Target Date  08/07/19      PT LONG TERM GOAL #5   Title  Patient will demonstrate coordinated lengthening and relaxation of PFM with diaphragmatic inhalation in order to decrease spasm and allow for unrestricted elimination of urine/feces for improved overall QOL.    Baseline  IE: not demonstrated    Time  12    Period  Weeks    Status  New    Target Date  08/07/19            Plan - 05/25/19 1835    Clinical Impression Statement  Patient presents to clinic with excellent motivation to participate in therapy. Patient demonstrates deficits in posture, pain free spinal ROM, PFM coordination, PFM extensibility, IAP management. Patient able to achieve exceptional PFM lengthening  with coordinated breath during today's session and responded positively to educational interventions. Patient will benefit from continued skilled therapeutic intervention to address remaining deficits in posture, pain free spinal ROM, PFM coordination, PFM extensibility, IAP management in order to increase function and improve overall QOL.    Personal Factors and Comorbidities  Age;Education;Sex;Comorbidity 3+;Past/Current Experience;Time since onset of injury/illness/exacerbation;Fitness;Behavior Pattern    Comorbidities  anxiety, bipolar 1, migraine, GERD, pyloric stenosis,    Examination-Activity Limitations  Continence;Lift;Squat;Locomotion Level;Stairs;Other    Examination-Participation Restrictions  Interpersonal Relationship;Cleaning;Laundry;Community Activity    Stability/Clinical Decision Making  Evolving/Moderate complexity    Rehab Potential  Fair    PT Frequency  1x / week  PT Duration  12 weeks    PT Treatment/Interventions  Cryotherapy;Biofeedback;Moist Heat;Gait training;Stair training;Functional mobility training;Therapeutic activities;Neuromuscular re-education;Balance training;Therapeutic exercise;Patient/family education;Scar mobilization;Taping;Manual techniques;Passive range of motion;Dry needling;Joint Manipulations;Spinal Manipulations    PT Next Visit Plan  self-exploration education; internal    PT Home Exercise Plan  Diaphragmatic breathing; toileting posture    Consulted and Agree with Plan of Care  Patient       Patient will benefit from skilled therapeutic intervention in order to improve the following deficits and impairments:  Decreased endurance, Decreased mobility, Increased muscle spasms, Pain, Postural dysfunction, Impaired flexibility, Increased fascial restricitons, Decreased strength, Decreased coordination, Decreased activity tolerance, Decreased range of motion, Decreased scar mobility, Impaired tone, Improper body mechanics  Visit Diagnosis: Other muscle  spasm  Other lack of coordination  Pelvic pain     Problem List Patient Active Problem List   Diagnosis Date Noted  . Dyspareunia in female 03/26/2019  . Bipolar 1 disorder ([place]) 03/26/2019  . Hemorrhoids 12/20/2014  . Nausea 12/20/2014  . Congenital enchondromatosis 07/03/2014  . Fatigue 11/07/2013  . Anemia, iron deficiency 11/22/2012  . Generalized anxiety disorder 01/07/2012  . GERD (gastroesophageal reflux disease)   . Migraine headache    Myles Gip PT, [place] #[id number] 05/25/2019, 6:45 PM  [facility] [address] [place], [place], [address] Phone: [phone]   Fax:  [phone]  Name: Carolyn Clements MRN: [id number] Date of Birth: 1970-04-23

## 2019-05-31 ENCOUNTER — Encounter: Payer: Self-pay | Admitting: Physical Therapy

## 2019-05-31 ENCOUNTER — Ambulatory Visit: Payer: PRIVATE HEALTH INSURANCE | Admitting: Physical Therapy

## 2019-05-31 ENCOUNTER — Other Ambulatory Visit: Payer: Self-pay

## 2019-05-31 DIAGNOSIS — R102 Pelvic and perineal pain: Secondary | ICD-10-CM

## 2019-05-31 DIAGNOSIS — R278 Other lack of coordination: Secondary | ICD-10-CM

## 2019-05-31 DIAGNOSIS — M62838 Other muscle spasm: Secondary | ICD-10-CM

## 2019-05-31 NOTE — Therapy (Signed)
[facility] [address] [place], [place], [address] Phone: [phone]   Fax:  [phone]  Physical Therapy Treatment  Patient Details  Name: Carolyn Clements MRN: [id number] Date of Birth: [birth year] Referring Provider (PT): Dalia Heading   Encounter Date: 05/31/2019  PT End of Session - 05/31/19 1820    Visit Number  3    Number of Visits  12    Date for PT Re-Evaluation  08/07/19    PT Start Time  1700    PT Stop Time  1755    PT Time Calculation (min)  55 min    Activity Tolerance  Patient tolerated treatment well    Behavior During Therapy  [facility] for tasks assessed/performed       Past Medical History:  Diagnosis Date  . Anxiety   . Bipolar 1 disorder ([place])   . Enchondroma, multiple   . GERD (gastroesophageal reflux disease)   . IUD    [facility]  Dr Kennon Rounds  . Migraine headache    sinus related  . Motion sickness    cars - long trips  . MVP (mitral valve prolapse)    Saw cardiology for syncope 7/16.  Marland Kitchen PONV (postoperative nausea and vomiting)   . Pyloric stenosis infant   Inguinal Hernia repair  . Screening for cervical cancer April 2012   normal PAP   . Wears contact lenses     Past Surgical History:  Procedure Laterality Date  . 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 CESAREAN SECTION     2003 and 2004  . HERNIA REPAIR  1976  . IUD REMOVAL N/A 05/17/2013   Procedure: INTRAUTERINE DEVICE (IUD) REMOVAL;  Surgeon: [name], MD;  Location: [facility];  Service: Gynecology;  Laterality: N/A;  Replacement of IUD  . PYLOROPLASTY  1974  . TOE SURGERY Left 2004   Enchondroma removal - great toe  . TONSILLECTOMY Bilateral 05/16/2015   Procedure: TONSILLECTOMY;  Surgeon: Margaretha Sheffield, MD;  Location: [facility];  Service: ENT;  Laterality: Bilateral;  Latex sensitivity    There were no vitals filed for this  visit.  Subjective Assessment - 05/31/19 1702    Subjective  Patient noting some increased stress with work and home demands, but is in good spirits. Patient states that she continues to work on her breathing multiple times/day. Patient has also been able to increase her water intake and add some dietary variety; however, she continues to have small Type 1 BMs. Patient adds that she is not having to strain to have these BMs, but the stool consistency has not shifted toward Type 4 yet.    Currently in Pain?  No/denies      TREATMENT Manual Therapy: STM and TPR performed to B adductor complexes to allow for decreased tension and pain and improved posture and function with vibratory and percussive instrument Colon massage/stimulation to promote bowel regulation and continued ease of BMs  Neuromuscular Re-education: Supine hooklying diaphragmatic breathing with VCs and TCs for downregulation of the nervous system and improved management of IAP Balance disc seated pelvic tilts A/P, lateral tilts, and pelvic clocks with emphasis on spinal extension and PFM lengthening with inhalations   Patient educated throughout session on appropriate technique and form using multi-modal cueing, HEP, and activity modification. Patient articulated understanding and returned demonstration.  Patient Response to interventions: Patient does not note any  concerns with seated PFM lengthening additions.  ASSESSMENT Patient presents to clinic with excellent motivation to participate in therapy. Patient demonstrates deficits in posture, pain free spinal ROM, PFM coordination, PFM extensibility, IAP management. Patient articulating exception awareness of PFM tension and ability to encourage lengthening during today's session and responded positively to educational interventions. Patient will benefit from continued skilled therapeutic intervention to address remaining deficits in posture, pain free spinal ROM, PFM  coordination, PFM extensibility, IAP management in order to increase function and improve overall QOL.      PT Long Term Goals - 05/16/19 0844      PT LONG TERM GOAL #1   Title  Patient will demonstrate independence with HEP in order to maximize therapeutic gains and improve carryover from physical therapy sessions to ADLs in the home and community.    Baseline  IE: not demonstrated    Time  12    Period  Weeks    Status  New    Target Date  08/07/19      PT LONG TERM GOAL #2   Title  Patient will demonstrate independent and coordinated diaphragmatic breathing in supine with a 1:2 breathing pattern for improved down-regulation of the nervous system and improved management of intra-abdominal pressures in order to increase function at home and in the community.    Baseline  IE: not demonstrated    Time  12    Period  Weeks    Status  New    Target Date  08/07/19      PT LONG TERM GOAL #3   Title  Patient will demonstrate improved function as evidenced by a score of 64 on FOTO measure for full participation in activities at home and in the community.    Baseline  IE: 55    Time  12    Period  Weeks    Status  New    Target Date  08/07/19      PT LONG TERM GOAL #4   Title  Patient will demonstrate improved toileting posture with knees higher than hips and feet supported, and will report straining <3x/week with bowel movements in order to decrease incidence of constipation/hemorrhoids/mismanagement of intra-abdominal pressure and improve overall QOL.    Baseline  IE: not demonstrated    Time  12    Period  Weeks    Status  New    Target Date  08/07/19      PT LONG TERM GOAL #5   Title  Patient will demonstrate coordinated lengthening and relaxation of PFM with diaphragmatic inhalation in order to decrease spasm and allow for unrestricted elimination of urine/feces for improved overall QOL.    Baseline  IE: not demonstrated    Time  12    Period  Weeks    Status  New    Target  Date  08/07/19            Plan - 05/31/19 1820    Clinical Impression Statement  Patient presents to clinic with excellent motivation to participate in therapy. Patient demonstrates deficits in posture, pain free spinal ROM, PFM coordination, PFM extensibility, IAP management. Patient articulating exception awareness of PFM tension and ability to encourage lengthening during today's session and responded positively to educational interventions. Patient will benefit from continued skilled therapeutic intervention to address remaining deficits in posture, pain free spinal ROM, PFM coordination, PFM extensibility, IAP management in order to increase function and improve overall QOL.    Personal Factors and Comorbidities  Age;Education;Sex;Comorbidity 3+;Past/Current Experience;Time since onset of injury/illness/exacerbation;Fitness;Behavior Pattern    Comorbidities  anxiety, bipolar 1, migraine, GERD, pyloric stenosis,    Examination-Activity Limitations  Continence;Lift;Squat;Locomotion Level;Stairs;Other    Examination-Participation Restrictions  Interpersonal Relationship;Cleaning;Laundry;Community Activity    Stability/Clinical Decision Making  Evolving/Moderate complexity    Rehab Potential  Fair    PT Frequency  1x / week    PT Duration  12 weeks    PT Treatment/Interventions  Cryotherapy;Biofeedback;Moist Heat;Gait training;Stair training;Functional mobility training;Therapeutic activities;Neuromuscular re-education;Balance training;Therapeutic exercise;Patient/family education;Scar mobilization;Taping;Manual techniques;Passive range of motion;Dry needling;Joint Manipulations;Spinal Manipulations    PT Next Visit Plan  self-exploration education; internal    PT Home Exercise Plan  Diaphragmatic breathing; toileting posture    Consulted and Agree with Plan of Care  Patient       Patient will benefit from skilled therapeutic intervention in order to improve the following deficits and  impairments:  Decreased endurance, Decreased mobility, Increased muscle spasms, Pain, Postural dysfunction, Impaired flexibility, Increased fascial restricitons, Decreased strength, Decreased coordination, Decreased activity tolerance, Decreased range of motion, Decreased scar mobility, Impaired tone, Improper body mechanics  Visit Diagnosis: Other muscle spasm  Other lack of coordination  Pelvic pain     Problem List Patient Active Problem List   Diagnosis Date Noted  . Dyspareunia in female 03/26/2019  . Bipolar 1 disorder ([facility]) 03/26/2019  . Hemorrhoids 12/20/2014  . Nausea 12/20/2014  . Congenital enchondromatosis 07/03/2014  . Fatigue 11/07/2013  . Anemia, iron deficiency 11/22/2012  . Generalized anxiety disorder 01/07/2012  . GERD (gastroesophageal reflux disease)   . Migraine headache    Sheria Lang PT, [place] #[id number] 05/31/2019, 6:27 PM  [facility] [address] [place], [place], [address] Phone: [phone]   Fax:  [phone]  Name: Carolyn Clements MRN: [id number] Date of Birth: Jan 10, 1970

## 2019-06-01 ENCOUNTER — Encounter: Payer: No Typology Code available for payment source | Admitting: Physical Therapy

## 2019-06-08 ENCOUNTER — Ambulatory Visit: Payer: PRIVATE HEALTH INSURANCE | Admitting: Physical Therapy

## 2019-06-15 ENCOUNTER — Ambulatory Visit: Payer: PRIVATE HEALTH INSURANCE | Admitting: Physical Therapy

## 2019-06-22 ENCOUNTER — Other Ambulatory Visit: Payer: Self-pay

## 2019-06-22 ENCOUNTER — Encounter: Payer: Self-pay | Admitting: Physical Therapy

## 2019-06-22 ENCOUNTER — Ambulatory Visit: Payer: PRIVATE HEALTH INSURANCE | Attending: Certified Nurse Midwife | Admitting: Physical Therapy

## 2019-06-22 DIAGNOSIS — M62838 Other muscle spasm: Secondary | ICD-10-CM | POA: Insufficient documentation

## 2019-06-22 DIAGNOSIS — R278 Other lack of coordination: Secondary | ICD-10-CM | POA: Insufficient documentation

## 2019-06-22 DIAGNOSIS — R102 Pelvic and perineal pain: Secondary | ICD-10-CM | POA: Diagnosis present

## 2019-06-22 NOTE — Therapy (Signed)
[facility] [address] [place], [place], [address] Phone: [phone]   Fax:  [phone]  Physical Therapy Treatment  Patient Details  Name: Carolyn Clements MRN: [id number] Date of Birth: 01-Jan-1971 Referring Provider (PT): Farrel Conners   Encounter Date: 06/22/2019   PT End of Session - 06/22/19 1706    Visit Number 4    Number of Visits 12    Date for PT Re-Evaluation 08/07/19    PT Start Time 1658    PT Stop Time 1755    PT Time Calculation (min) 57 min    Activity Tolerance Patient tolerated treatment well    Behavior During Therapy [facility] for tasks assessed/performed           Past Medical History:  Diagnosis Date  . Anxiety   . Bipolar 1 disorder ([facility])   . Enchondroma, multiple   . GERD (gastroesophageal reflux disease)   . IUD    [facility]  Dr Shawnie Pons  . Migraine headache    sinus related  . Motion sickness    cars - long trips  . MVP (mitral valve prolapse)    Saw cardiology for syncope 7/16.  Marland Kitchen PONV (postoperative nausea and vomiting)   . Pyloric stenosis infant   Inguinal Hernia repair  . Screening for cervical cancer April 2012   normal PAP   . Wears contact lenses     Past Surgical History:  Procedure Laterality Date  . 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 CESAREAN SECTION     2003 and 2004  . HERNIA REPAIR  1976  . IUD REMOVAL N/A 05/17/2013   Procedure: INTRAUTERINE DEVICE (IUD) REMOVAL;  Surgeon: Reva Bores, MD;  Location: [facility];  Service: Gynecology;  Laterality: N/A;  Replacement of IUD  . PYLOROPLASTY  1974  . TOE SURGERY Left 2004   Enchondroma removal - great toe  . TONSILLECTOMY Bilateral 05/16/2015   Procedure: TONSILLECTOMY;  Surgeon: Vernie Murders, MD;  Location: [facility];  Service: ENT;  Laterality: Bilateral;  Latex sensitivity    There were no vitals filed for this visit.    Subjective Assessment - 06/22/19 1700    Subjective Patient notes that she has been busy. She states that she has been doing her stretches and breathing. Patient notes that she had sex twice since her last session; patient had one positive experience and the second experience resulted in irritation post-coitus. She notes continued constipation with BM every 4 days. Patient notes abdominal discomfort on day 3.    Currently in Pain? No/denies           TREATMENT Manual Therapy: INTERNAL VAGINAL EXAM: Patient educated on the purpose of the pelvic exam and articulated understanding; patient consented to the exam verbally. Introitus Appears: WNL, no indication of skin irritation Skin integrity: no indication of skin irritation Scar mobility: n/a Symmetry: considerably greater tone on R with "stinging" and sensation of tearing with palpation Palpation: tender from 6-10 o'clock of superficial PFM; unable to assess deep PFM 2/2 to increased tone in PFM  Posterior Fourchette mobilization for decreased fascial restriction and pain, R>L.  STM of superficial PFM from 6-8 o'clock with gentle cross-fiber mobilization with exhalatio Soft tissue mobilizations with movement (BKFO) for decreased spasm and improved mobility, performed at 8-9 o'clock of superficial PFM.  Neuromuscular Re-education: Supine hooklying diaphragmatic breathing with VCs and TCs for downregulation  of the nervous system and improved management of IAP Patient education on pain modulation strategies for post-penetration (intercourse of self- internal release) including: stretches and cryotherapy.  Patient educated throughout session on appropriate technique and form using multi-modal cueing, HEP, and activity modification. Patient articulated understanding and returned demonstration.  Patient Response to interventions: Patient notes some increased stinging at end of session, but notes it is tolerable.  ASSESSMENT Patient presents  to clinic with excellent motivation to participate in therapy. Patient demonstrates deficits in posture, pain free spinal ROM, PFM coordination, PFM extensibility, IAP management. Patient tolerating internal release of superficial mm on R as well as posterior fourchette mobilization with some noted release primarily at 6-7 o'clock during today's session and responded positively to educational interventions. Patient will benefit from continued skilled therapeutic intervention to address remaining deficits in posture, pain free spinal ROM, PFM coordination, PFM extensibility, IAP management in order to increase function and improve overall QOL.     PT Long Term Goals - 05/16/19 0844      PT LONG TERM GOAL #1   Title Patient will demonstrate independence with HEP in order to maximize therapeutic gains and improve carryover from physical therapy sessions to ADLs in the home and community.    Baseline IE: not demonstrated    Time 12    Period Weeks    Status New    Target Date 08/07/19      PT LONG TERM GOAL #2   Title Patient will demonstrate independent and coordinated diaphragmatic breathing in supine with a 1:2 breathing pattern for improved down-regulation of the nervous system and improved management of intra-abdominal pressures in order to increase function at home and in the community.    Baseline IE: not demonstrated    Time 12    Period Weeks    Status New    Target Date 08/07/19      PT LONG TERM GOAL #3   Title Patient will demonstrate improved function as evidenced by a score of 64 on FOTO measure for full participation in activities at home and in the community.    Baseline IE: 57    Time 12    Period Weeks    Status New    Target Date 08/07/19      PT LONG TERM GOAL #4   Title Patient will demonstrate improved toileting posture with knees higher than hips and feet supported, and will report straining <3x/week with bowel movements in order to decrease incidence of  constipation/hemorrhoids/mismanagement of intra-abdominal pressure and improve overall QOL.    Baseline IE: not demonstrated    Time 12    Period Weeks    Status New    Target Date 08/07/19      PT LONG TERM GOAL #5   Title Patient will demonstrate coordinated lengthening and relaxation of PFM with diaphragmatic inhalation in order to decrease spasm and allow for unrestricted elimination of urine/feces for improved overall QOL.    Baseline IE: not demonstrated    Time 12    Period Weeks    Status New    Target Date 08/07/19                 Plan - 06/22/19 1707    Clinical Impression Statement Patient presents to clinic with excellent motivation to participate in therapy. Patient demonstrates deficits in posture, pain free spinal ROM, PFM coordination, PFM extensibility, IAP management. Patient tolerating internal release of superficial mm on R as well as posterior fourchette mobilization  with some noted release primarily at 6-7 o'clock during today's session and responded positively to educational interventions. Patient will benefit from continued skilled therapeutic intervention to address remaining deficits in posture, pain free spinal ROM, PFM coordination, PFM extensibility, IAP management in order to increase function and improve overall QOL.    Personal Factors and Comorbidities Age;Education;Sex;Comorbidity 3+;Past/Current Experience;Time since onset of injury/illness/exacerbation;Fitness;Behavior Pattern    Comorbidities anxiety, bipolar 1, migraine, GERD, pyloric stenosis,    Examination-Activity Limitations Continence;Lift;Squat;Locomotion Level;Stairs;Other    Examination-Participation Restrictions Interpersonal Relationship;Cleaning;Laundry;Community Activity    Stability/Clinical Decision Making Evolving/Moderate complexity    Rehab Potential Fair    PT Frequency 1x / week    PT Duration 12 weeks    PT Treatment/Interventions Cryotherapy;Biofeedback;Moist Heat;Gait  training;Stair training;Functional mobility training;Therapeutic activities;Neuromuscular re-education;Balance training;Therapeutic exercise;Patient/family education;Scar mobilization;Taping;Manual techniques;Passive range of motion;Dry needling;Joint Manipulations;Spinal Manipulations    PT Next Visit Plan self-exploration education; internal    PT Home Exercise Plan Diaphragmatic breathing; toileting posture    Consulted and Agree with Plan of Care Patient           Patient will benefit from skilled therapeutic intervention in order to improve the following deficits and impairments:  Decreased endurance, Decreased mobility, Increased muscle spasms, Pain, Postural dysfunction, Impaired flexibility, Increased fascial restricitons, Decreased strength, Decreased coordination, Decreased activity tolerance, Decreased range of motion, Decreased scar mobility, Impaired tone, Improper body mechanics  Visit Diagnosis: Other muscle spasm  Other lack of coordination  Pelvic pain     Problem List Patient Active Problem List   Diagnosis Date Noted  . Dyspareunia in female 03/26/2019  . Bipolar 1 disorder (HCC) 03/26/2019  . Hemorrhoids 12/20/2014  . Nausea 12/20/2014  . Congenital enchondromatosis 07/03/2014  . Fatigue 11/07/2013  . Anemia, iron deficiency 11/22/2012  . Generalized anxiety disorder 01/07/2012  . GERD (gastroesophageal reflux disease)   . Migraine headache    Sheria Lang PT, [place] #[id number] 06/22/2019, 6:12 PM  [facility] [address] [place], [place], [address] Phone: [phone]   Fax:  [phone]  Name: Yazmina Pareja MRN: [id number] Date of Birth: 1970-05-29

## 2019-06-29 ENCOUNTER — Ambulatory Visit: Payer: PRIVATE HEALTH INSURANCE | Admitting: Physical Therapy

## 2019-07-06 ENCOUNTER — Encounter: Payer: No Typology Code available for payment source | Admitting: Physical Therapy

## 2019-07-13 ENCOUNTER — Other Ambulatory Visit: Payer: Self-pay

## 2019-07-13 ENCOUNTER — Ambulatory Visit: Payer: PRIVATE HEALTH INSURANCE | Attending: Certified Nurse Midwife | Admitting: Physical Therapy

## 2019-07-13 ENCOUNTER — Encounter: Payer: Self-pay | Admitting: Physical Therapy

## 2019-07-13 DIAGNOSIS — R102 Pelvic and perineal pain: Secondary | ICD-10-CM | POA: Diagnosis present

## 2019-07-13 DIAGNOSIS — M62838 Other muscle spasm: Secondary | ICD-10-CM | POA: Diagnosis not present

## 2019-07-13 DIAGNOSIS — R278 Other lack of coordination: Secondary | ICD-10-CM | POA: Insufficient documentation

## 2019-07-13 NOTE — Therapy (Signed)
[facility] [address]. [place], [place], [address] Phone: [phone]   Fax:  [phone]  Physical Therapy Treatment  Patient Details  Name: Carolyn Clements MRN: [id number] Date of Birth: [birth year] Referring Provider (PT): Farrel Conners   Encounter Date: 07/13/2019   PT End of Session - 07/13/19 1754    Visit Number 5    Number of Visits 12    Date for PT Re-Evaluation 08/07/19    PT Start Time 1700    PT Stop Time 1745    PT Time Calculation (min) 45 min    Activity Tolerance Patient tolerated treatment well;Patient limited by fatigue    Behavior During Therapy [facility] for tasks assessed/performed           Past Medical History:  Diagnosis Date  . Anxiety   . Bipolar 1 disorder (HCC)   . Enchondroma, multiple   . GERD (gastroesophageal reflux disease)   . IUD    Center for [facility]' health  Dr Shawnie Pons  . Migraine headache    sinus related  . Motion sickness    cars - long trips  . MVP (mitral valve prolapse)    Saw cardiology for syncope 7/16.  Marland Kitchen PONV (postoperative nausea and vomiting)   . Pyloric stenosis infant   Inguinal Hernia repair  . Screening for cervical cancer April 2012   normal PAP   . Wears contact lenses     Past Surgical History:  Procedure Laterality Date  . 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 CESAREAN SECTION     2003 and 2004  . HERNIA REPAIR  1976  . IUD REMOVAL N/A 05/17/2013   Procedure: INTRAUTERINE DEVICE (IUD) REMOVAL;  Surgeon: Reva Bores, MD;  Location: [facility];  Service: Gynecology;  Laterality: N/A;  Replacement of IUD  . PYLOROPLASTY  1974  . TOE SURGERY Left 2004   Enchondroma removal - great toe  . TONSILLECTOMY Bilateral 05/16/2015   Procedure: TONSILLECTOMY;  Surgeon: Vernie Murders, MD;  Location: [facility];  Service: ENT;  Laterality: Bilateral;  Latex sensitivity    There were no vitals  filed for this visit.   Subjective Assessment - 07/13/19 1700    Subjective Patient notes she has had increased work demands and had some increased stress. Patient has only been able to participate in deep breathing since her last session. Patient has also had increased stress with family concerns. Patient has not been able to figure out how to do internal work herself. She was not sore after last session.    Currently in Pain? No/denies          TREATMENT Neuromuscular Re-education: Patient education and demonstration of pelvic wand use for internal release HEP. Patient educated on graded exposure techniques/progressions and reviewed current strategies for PFM release via stretches, breath work, and stress management strategies.   Patient educated throughout session on appropriate technique and form using multi-modal cueing, HEP, and activity modification. Patient articulated understanding and returned demonstration.   ASSESSMENT Patient presents to clinic with excellent motivation to participate in therapy. Patient demonstrates deficits in posture, pain free spinal ROM, PFM coordination, PFM extensibility, IAP management. Patient articulating good understanding of internal release strategies during today's session and responded positively to educational interventions. Patient will benefit from continued skilled therapeutic intervention to address remaining deficits in posture, pain free spinal ROM, PFM coordination, PFM extensibility, IAP management in  order to increase function and improve overall QOL.     PT Long Term Goals - 05/16/19 0844      PT LONG TERM GOAL #1   Title Patient will demonstrate independence with HEP in order to maximize therapeutic gains and improve carryover from physical therapy sessions to ADLs in the home and community.    Baseline IE: not demonstrated    Time 12    Period Weeks    Status New    Target Date 08/07/19      PT LONG TERM GOAL #2   Title  Patient will demonstrate independent and coordinated diaphragmatic breathing in supine with a 1:2 breathing pattern for improved down-regulation of the nervous system and improved management of intra-abdominal pressures in order to increase function at home and in the community.    Baseline IE: not demonstrated    Time 12    Period Weeks    Status New    Target Date 08/07/19      PT LONG TERM GOAL #3   Title Patient will demonstrate improved function as evidenced by a score of 64 on FOTO measure for full participation in activities at home and in the community.    Baseline IE: 55    Time 12    Period Weeks    Status New    Target Date 08/07/19      PT LONG TERM GOAL #4   Title Patient will demonstrate improved toileting posture with knees higher than hips and feet supported, and will report straining <3x/week with bowel movements in order to decrease incidence of constipation/hemorrhoids/mismanagement of intra-abdominal pressure and improve overall QOL.    Baseline IE: not demonstrated    Time 12    Period Weeks    Status New    Target Date 08/07/19      PT LONG TERM GOAL #5   Title Patient will demonstrate coordinated lengthening and relaxation of PFM with diaphragmatic inhalation in order to decrease spasm and allow for unrestricted elimination of urine/feces for improved overall QOL.    Baseline IE: not demonstrated    Time 12    Period Weeks    Status New    Target Date 08/07/19                 Plan - 07/13/19 1755    Clinical Impression Statement Patient presents to clinic with excellent motivation to participate in therapy. Patient demonstrates deficits in posture, pain free spinal ROM, PFM coordination, PFM extensibility, IAP management. Patient articulating good understanding of internal release strategies during today's session and responded positively to educational interventions. Patient will benefit from continued skilled therapeutic intervention to address  remaining deficits in posture, pain free spinal ROM, PFM coordination, PFM extensibility, IAP management in order to increase function and improve overall QOL.    Personal Factors and Comorbidities Age;Education;Sex;Comorbidity 3+;Past/Current Experience;Time since onset of injury/illness/exacerbation;Fitness;Behavior Pattern    Comorbidities anxiety, bipolar 1, migraine, GERD, pyloric stenosis,    Examination-Activity Limitations Continence;Lift;Squat;Locomotion Level;Stairs;Other    Examination-Participation Restrictions Interpersonal Relationship;Cleaning;Laundry;Community Activity    Stability/Clinical Decision Making Evolving/Moderate complexity    Rehab Potential Fair    PT Frequency 1x / week    PT Duration 12 weeks    PT Treatment/Interventions Cryotherapy;Biofeedback;Moist Heat;Gait training;Stair training;Functional mobility training;Therapeutic activities;Neuromuscular re-education;Balance training;Therapeutic exercise;Patient/family education;Scar mobilization;Taping;Manual techniques;Passive range of motion;Dry needling;Joint Manipulations;Spinal Manipulations    PT Next Visit Plan internal reassessment    PT Home Exercise Plan Diaphragmatic breathing; toileting posture    Consulted  and Agree with Plan of Care Patient           Patient will benefit from skilled therapeutic intervention in order to improve the following deficits and impairments:  Decreased endurance, Decreased mobility, Increased muscle spasms, Pain, Postural dysfunction, Impaired flexibility, Increased fascial restricitons, Decreased strength, Decreased coordination, Decreased activity tolerance, Decreased range of motion, Decreased scar mobility, Impaired tone, Improper body mechanics  Visit Diagnosis: Other muscle spasm  Other lack of coordination  Pelvic pain     Problem List Patient Active Problem List   Diagnosis Date Noted  . Dyspareunia in female 03/26/2019  . Bipolar 1 disorder (HCC) 03/26/2019   . Hemorrhoids 12/20/2014  . Nausea 12/20/2014  . Congenital enchondromatosis 07/03/2014  . Fatigue 11/07/2013  . Anemia, iron deficiency 11/22/2012  . Generalized anxiety disorder 01/07/2012  . GERD (gastroesophageal reflux disease)   . Migraine headache    Sheria Lang PT, [place] #[id number] 07/13/2019, 6:00 PM  [facility] [address] [place], [place], [address] Phone: [phone]   Fax:  [phone]  Name: Carolyn Clements MRN: [id number] Date of Birth: 02/11/1970

## 2019-07-13 NOTE — Patient Instructions (Signed)
1. Diaphragmatic breathing and PFM coordination (lengthening).  10x 2. Supine stretches with breathing and reverse kegel.  a. Adductor b. Happy baby c. Knee to chest 3. Pelvic Wand a. First pass as an assessment for tension. (Remember the clock positions. 12=navel) b. Second pass rest at trigger points and use breath and very gentle pressure to release. c. Then follow the same process for deeper PFM.  4. Follow with more breathing, reverse kegels, and stretches. 5. Ice if necessary. Frequency: 3x/week (build slow; trial 2 days back to back one time per week before progressing) Duration: 5 minutes (you can build up to a duration that matches your needs)

## 2019-08-10 ENCOUNTER — Encounter: Payer: Self-pay | Admitting: Physical Therapy

## 2019-08-10 ENCOUNTER — Ambulatory Visit: Payer: PRIVATE HEALTH INSURANCE | Attending: Certified Nurse Midwife | Admitting: Physical Therapy

## 2019-08-10 ENCOUNTER — Other Ambulatory Visit: Payer: Self-pay

## 2019-08-10 DIAGNOSIS — R102 Pelvic and perineal pain: Secondary | ICD-10-CM | POA: Diagnosis present

## 2019-08-10 DIAGNOSIS — R278 Other lack of coordination: Secondary | ICD-10-CM

## 2019-08-10 DIAGNOSIS — M62838 Other muscle spasm: Secondary | ICD-10-CM

## 2019-08-10 NOTE — Addendum Note (Signed)
Addended by: Kathryne Eriksson on: 08/10/2019 06:32 PM   Modules accepted: Orders

## 2019-08-10 NOTE — Therapy (Signed)
[facility] [address] [place], [place], [address] Phone: [phone]   Fax:  [phone]  Physical Therapy Treatment/Re-Certification  Patient Details  Name: Carolyn Clements MRN: [id number] Date of Birth: Mar 26, 1970 Referring Provider (PT): Farrel Conners   Encounter Date: 08/10/2019   PT End of Session - 08/10/19 1724    Visit Number 6    Number of Visits 12    Date for PT Re-Evaluation 11/02/19    PT Start Time 1700    PT Stop Time 1755    PT Time Calculation (min) 55 min    Activity Tolerance Patient tolerated treatment well    Behavior During Therapy [facility] for tasks assessed/performed           Past Medical History:  Diagnosis Date  . Anxiety   . Bipolar 1 disorder (HCC)   . Enchondroma, multiple   . GERD (gastroesophageal reflux disease)   . IUD    Center for [facility]' health  Dr Shawnie Pons  . Migraine headache    sinus related  . Motion sickness    cars - long trips  . MVP (mitral valve prolapse)    Saw cardiology for syncope 7/16.  Marland Kitchen PONV (postoperative nausea and vomiting)   . Pyloric stenosis infant   Inguinal Hernia repair  . Screening for cervical cancer April 2012   normal PAP   . Wears contact lenses     Past Surgical History:  Procedure Laterality Date  . 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 CESAREAN SECTION     2003 and 2004  . HERNIA REPAIR  1976  . IUD REMOVAL N/A 05/17/2013   Procedure: INTRAUTERINE DEVICE (IUD) REMOVAL;  Surgeon: Reva Bores, MD;  Location: [facility];  Service: Gynecology;  Laterality: N/A;  Replacement of IUD  . PYLOROPLASTY  1974  . TOE SURGERY Left 2004   Enchondroma removal - great toe  . TONSILLECTOMY Bilateral 05/16/2015   Procedure: TONSILLECTOMY;  Surgeon: Vernie Murders, MD;  Location: [facility];  Service: ENT;  Laterality: Bilateral;  Latex sensitivity    There were no vitals filed for  this visit.   Subjective Assessment - 08/10/19 1704    Subjective Patient notes that she has been less consistent with her stretches and internal release work. She is increasinginly stressed by motherhood duties with a daughter transitioning to college. She now has her wand for internal release but has only been able to use it once. Patient adds that she can feel herself clenching/tightness at PFM. Patient reports that she was able to have intercourse without much pain; notes some pain with penetration but able to use breathing to calm down the pain. She had some bruising sensation the day after.    Currently in Pain? No/denies          TREATMENT  Neuromuscular Re-education: Reassessed goals; see below.  Patient education on urge suppression strategies for decreased urinary frequency symptoms; provided with handout and demonstration. Supine hooklying diaphragmatic breathing with VCs and TCs for downregulation of the nervous system and improved management of IAP Supine hooklying, PFM lengthening with inhalation. VCs and TCs to decrease compensatory patterns and encourage optimal relaxation of the PFM. Supported bridge, PFM lengthening with inhalation for improved pudendal nerve mobilization and PFM release   Patient educated throughout session on appropriate technique and form using multi-modal cueing, HEP, and activity modification. Patient articulated understanding and returned  demonstration.   ASSESSMENT Patient presents to clinic with excellent motivation to participate in therapy. Patient demonstrates deficits in posture, pain free spinal ROM, PFM coordination, PFM extensibility, IAP management. Patient has indicated significant progress or achievement of a majority of her goals during today's session and continues to respond positively to educational and active interventions. Patient's condition has the potential to improve in response to therapy. Maximum improvement is yet to be  obtained. The anticipated improvement is attainable and reasonable in a generally predictable time. Patient will benefit from continued skilled therapeutic intervention to address remaining deficits in posture, pain free spinal ROM, PFM coordination, PFM extensibility, IAP management in order to increase function and improve overall QOL.     PT Long Term Goals - 08/10/19 1725      PT LONG TERM GOAL #1   Title Patient will demonstrate independence with HEP in order to maximize therapeutic gains and improve carryover from physical therapy sessions to ADLs in the home and community.    Baseline IE: not demonstrated; 8/5: 75-80% consistent, 100% IND    Time 12    Period Weeks    Status On-going    Target Date 11/02/19      PT LONG TERM GOAL #2   Title Patient will demonstrate independent and coordinated diaphragmatic breathing in supine with a 1:2 breathing pattern for improved down-regulation of the nervous system and improved management of intra-abdominal pressures in order to increase function at home and in the community.    Baseline IE: not demonstrated; 8/5: IND    Time 12    Period Weeks    Status Achieved      PT LONG TERM GOAL #3   Title Patient will demonstrate improved function as evidenced by a score of 64 on FOTO measure for full participation in activities at home and in the community.    Baseline IE: 55; 8/5:60    Time 12    Period Weeks    Status On-going    Target Date 11/02/19      PT LONG TERM GOAL #4   Title Patient will demonstrate improved toileting posture with knees higher than hips and feet supported, and will report straining <3x/week with bowel movements in order to decrease incidence of constipation/hemorrhoids/mismanagement of intra-abdominal pressure and improve overall QOL.    Baseline IE: not demonstrated; 8/5: 1-2x straining    Time 12    Period Weeks    Status Achieved      PT LONG TERM GOAL #5   Title Patient will demonstrate coordinated lengthening  and relaxation of PFM with diaphragmatic inhalation in order to decrease spasm and allow for unrestricted elimination of urine/feces for improved overall QOL.    Baseline IE: not demonstrated; 8/5: demonstrated consistently for 5 reps in supine posture    Time 12    Period Weeks    Status On-going    Target Date 11/02/19                 Plan - 08/10/19 1737    Clinical Impression Statement Patient presents to clinic with excellent motivation to participate in therapy. Patient demonstrates deficits in posture, pain free spinal ROM, PFM coordination, PFM extensibility, IAP management. Patient has indicated significant progress or achievement of a majority of her goals during today's session and continues to respond positively to educational and active interventions. Patient's condition has the potential to improve in response to therapy. Maximum improvement is yet to be obtained. The anticipated improvement is attainable  and reasonable in a generally predictable time. Patient will benefit from continued skilled therapeutic intervention to address remaining deficits in posture, pain free spinal ROM, PFM coordination, PFM extensibility, IAP management in order to increase function and improve overall QOL.    Personal Factors and Comorbidities Age;Education;Sex;Comorbidity 3+;Past/Current Experience;Time since onset of injury/illness/exacerbation;Fitness;Behavior Pattern    Comorbidities anxiety, bipolar 1, migraine, GERD, pyloric stenosis,    Examination-Activity Limitations Continence;Lift;Squat;Locomotion Level;Stairs;Other    Examination-Participation Restrictions Interpersonal Relationship;Cleaning;Laundry;Community Activity    Stability/Clinical Decision Making Evolving/Moderate complexity    Rehab Potential Fair    PT Frequency 1x / week    PT Duration 12 weeks    PT Treatment/Interventions Cryotherapy;Biofeedback;Moist Heat;Gait training;Stair training;Functional mobility  training;Therapeutic activities;Neuromuscular re-education;Balance training;Therapeutic exercise;Patient/family education;Scar mobilization;Taping;Manual techniques;Passive range of motion;Dry needling;Joint Manipulations;Spinal Manipulations    PT Next Visit Plan progression of pudendal nerve desensitization    PT Home Exercise Plan pudendal nerve mobilization    Consulted and Agree with Plan of Care Patient           Patient will benefit from skilled therapeutic intervention in order to improve the following deficits and impairments:  Decreased endurance, Decreased mobility, Increased muscle spasms, Pain, Postural dysfunction, Impaired flexibility, Increased fascial restricitons, Decreased strength, Decreased coordination, Decreased activity tolerance, Decreased range of motion, Decreased scar mobility, Impaired tone, Improper body mechanics  Visit Diagnosis: Other muscle spasm  Other lack of coordination  Pelvic pain     Problem List Patient Active Problem List   Diagnosis Date Noted  . Dyspareunia in female 03/26/2019  . Bipolar 1 disorder (HCC) 03/26/2019  . Hemorrhoids 12/20/2014  . Nausea 12/20/2014  . Congenital enchondromatosis 07/03/2014  . Fatigue 11/07/2013  . Anemia, iron deficiency 11/22/2012  . Generalized anxiety disorder 01/07/2012  . GERD (gastroesophageal reflux disease)   . Migraine headache    Sheria Lang PT, [place] #[id number] 08/10/2019, 6:30 PM  [facility] [address] [place], [place], [address] Phone: [phone]   Fax:  [phone]  Name: Ellyson Rarick MRN: [id number] Date of Birth: May 09, 1970

## 2019-08-31 ENCOUNTER — Ambulatory Visit: Payer: PRIVATE HEALTH INSURANCE | Admitting: Physical Therapy

## 2020-05-10 ENCOUNTER — Other Ambulatory Visit (HOSPITAL_COMMUNITY)
Admission: RE | Admit: 2020-05-10 | Discharge: 2020-05-10 | Disposition: A | Discharge status: Home / Self Care | Payer: PRIVATE HEALTH INSURANCE | Source: Ambulatory Visit | Attending: Obstetrics and Gynecology | Admitting: Obstetrics and Gynecology

## 2020-05-10 ENCOUNTER — Ambulatory Visit (INDEPENDENT_AMBULATORY_CARE_PROVIDER_SITE_OTHER): Payer: PRIVATE HEALTH INSURANCE | Admitting: Obstetrics and Gynecology

## 2020-05-10 ENCOUNTER — Encounter: Payer: Self-pay | Admitting: Obstetrics and Gynecology

## 2020-05-10 ENCOUNTER — Other Ambulatory Visit: Payer: Self-pay

## 2020-05-10 VITALS — BP 122/74 | Ht 62.5 in | Wt 152.0 lb

## 2020-05-10 DIAGNOSIS — R87610 Atypical squamous cells of undetermined significance on cytologic smear of cervix (ASC-US): Secondary | ICD-10-CM | POA: Insufficient documentation

## 2020-05-10 DIAGNOSIS — Z1331 Encounter for screening for depression: Secondary | ICD-10-CM | POA: Diagnosis not present

## 2020-05-10 DIAGNOSIS — Z1339 Encounter for screening examination for other mental health and behavioral disorders: Secondary | ICD-10-CM

## 2020-05-10 DIAGNOSIS — Z01419 Encounter for gynecological examination (general) (routine) without abnormal findings: Secondary | ICD-10-CM | POA: Insufficient documentation

## 2020-05-10 NOTE — Progress Notes (Signed)
Gynecology Annual Exam  PCP: [name], MD  Chief Complaint  Patient presents with  . Annual Exam   History of Present Illness:  [name] is a 50 y.o. [id number] who LMP was No LMP recorded. (Menstrual status: IUD)., presents today for her annual examination.  Her menses are absent with her IUD (placed 2 years ago).  She is sexually active. She had some pain with intercourse. She underwent pelvic floor PT and this has helped.   Last Pap: 03/24/2019  Results were: low-grade squamous intraepithelial neoplasia (LGSIL - encompassing HPV,mild dysplasia,CIN I) /neg HPV DNA negative  Colpo: negative Pap 04/2018: Ascus, HPV negative, colpo negative 06/2016: ASCUS - HPV negative Hx of STDs: none  There is a FH of breast cancer in her MGM and PGM. There is no FH of ovarian cancer. The patient does do self-breast exams.  Tobacco use: The patient denies current or previous tobacco use. Alcohol use: social drinker Exercise: moderately active (twice a week, functional workout)  The patient wears seatbelts: yes.   The patient reports that domestic violence in her life is absent.   Past Medical History:  Diagnosis Date  . Anxiety   . Bipolar 1 disorder ([place])   . Enchondroma, multiple   . GERD (gastroesophageal reflux disease)   . IUD    [facility] for [facility]  Dr Kennon Rounds  . Migraine headache    sinus related  . Motion sickness    cars - long trips  . MVP (mitral valve prolapse)    Saw cardiology for syncope 7/16.  Marland Kitchen PONV (postoperative nausea and vomiting)   . Pyloric stenosis infant   Inguinal Hernia repair  . Screening for cervical cancer April 2012   normal PAP   . Wears contact lenses    Past Surgical History:  Procedure Laterality Date  . 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 CESAREAN SECTION     2003 and 2004  . HERNIA REPAIR  1976  . IUD REMOVAL N/A 05/17/2013   Procedure: INTRAUTERINE DEVICE  (IUD) REMOVAL;  Surgeon: [name], MD;  Location: [facility];  Service: Gynecology;  Laterality: N/A;  Replacement of IUD  . PYLOROPLASTY  1974  . TOE SURGERY Left 2004   Enchondroma removal - great toe  . TONSILLECTOMY Bilateral 05/16/2015   Procedure: TONSILLECTOMY;  Surgeon: Margaretha Sheffield, MD;  Location: [facility];  Service: ENT;  Laterality: Bilateral;  Latex sensitivity    Prior to Admission medications   Medication Sig Start Date End Date Taking? Authorizing Provider  Cholecalciferol (VITAMIN D3) 25 MCG (1000 UT) CAPS Take 1 capsule by mouth daily.    [provider]  FA-Cyanocobalamin-B6-D-Ca-Aloe (D 1000 PLUS ALOE) TABS Take 1 tablet by mouth daily. 12/27/18   [provider]  fexofenadine (ALLEGRA) 180 MG tablet Take 180 mg by mouth daily.    [provider]  gabapentin (NEURONTIN) 300 MG capsule Take 1 capsule by mouth at bedtime. 12/27/18   [provider]  ketotifen (ZADITOR) 0.025 % ophthalmic solution Place 1 drop into both eyes 2 (two) times daily.      [provider]  lamoTRIgine (LAMICTAL) 200 MG tablet TK 1 T PO D 11/19/16   [provider]  levonorgestrel (MIRENA) 20 MCG/24HR IUD 1 Intra Uterine Device (1 each total) by Intrauterine route once for 1 dose. 04/27/18 04/26/24  Will Bonnet, MD  Melatonin 3 MG  TABS TK 1 T PO HS PRN 09/26/18   [provider]  meloxicam (MOBIC) 15 MG tablet Take 15 mg by mouth daily. 11/12/18   [provider]  omeprazole (PRILOSEC) 20 MG capsule TAKE ONE CAPSULE BY MOUTH TWICE A DAY 06/18/15   [name], MD  Probiotic Product (PROBIOTIC FORMULA PO) Take 1 capsule by mouth 2 (two) times daily.     [provider]  traZODone (DESYREL) 100 MG tablet Take 2 tablets by mouth daily. 12/27/18   [provider]  triamcinolone (NASACORT ALLERGY 24HR) 55 MCG/ACT AERO nasal inhaler Place 2 sprays into the nose daily.    [provider]   Turmeric 500 MG CAPS Take 1 capsule by mouth daily. 12/27/18   [provider]  vitamin B-12 (CYANOCOBALAMIN) 500 MCG tablet Take 500 mcg by mouth daily.    [provider]    Allergies  Allergen Reactions  . Codeine Sulfate Itching and Nausea And Vomiting    Says all narcotics cause itching and she tries to avoid  . Sulfa Antibiotics Hives    Hives and fever at age of 72, has not had since then  . Tape Itching    whelts  . Latex Rash    Not severe, but avoids gloves  condoms   Obstetric History: [id number]  Social History   Socioeconomic History  . Marital status: Married    Spouse name: Not on file  . Number of children: 2  . Years of education: Not on file  . Highest education level: Not on file  Occupational History  . Occupation: physical therapist    Employer: [employer]    Comment: Physical Therapist  Tobacco Use  . Smoking status: Never Smoker  . Smokeless tobacco: Never Used  Vaping Use  . Vaping Use: Never used  Substance and Sexual Activity  . Alcohol use: Yes    Comment: 4-5 drinks on weekends  . Drug use: No  . Sexual activity: Yes    Birth control/protection: I.U.D.  Other Topics Concern  . Not on file  Social History Narrative  . Not on file   Social Determinants of Health   Financial Resource Strain: Not on file  Food Insecurity: Not on file  Transportation Needs: Not on file  Physical Activity: Not on file  Stress: Not on file  Social Connections: Not on file  Intimate Partner Violence: Not on file    Family History  Problem Relation Age of Onset  . Cancer Father        adenocarcinoma, nonsmoker  . COPD Mother   . Diabetes Mother   . Hypertension Mother   . Cancer Maternal Grandfather        lung  . BRCA 1/2 Paternal Grandmother   . Cancer Paternal Grandfather        lung    Review of Systems  Constitutional: Negative.   HENT: Negative.   Eyes: Negative.   Respiratory: Negative.   Cardiovascular: Negative.    Gastrointestinal: Negative.   Genitourinary: Negative.   Musculoskeletal: Negative.   Skin: Negative.   Neurological: Negative.   Psychiatric/Behavioral: Negative.      Physical Exam BP 122/74   Ht 5' 2.5" (1.588 m)   Wt 152 lb (68.9 kg)   BMI 27.36 kg/m    Physical Exam Constitutional:      General: She is not in acute distress.    Appearance: Normal appearance. She is well-developed.  Genitourinary:     Vulva  and bladder normal.     Right Labia: No rash, tenderness, lesions, skin changes or Bartholin's cyst.    Left Labia: No tenderness, lesions, skin changes, Bartholin's cyst or rash.    No inguinal adenopathy present in the right or left side.    Pelvic Tanner Score: 5/5.    No vaginal discharge, erythema, tenderness or bleeding.      Right Adnexa: not tender, not full and no mass present.    Left Adnexa: not tender, not full and no mass present.    No cervical motion tenderness, discharge, lesion or polyp.     Uterus is not enlarged or tender.     No uterine mass detected.    Pelvic exam was performed with patient in the lithotomy position.  Breasts:     Right: No inverted nipple, mass, nipple discharge, skin change or tenderness.     Left: No inverted nipple, mass, nipple discharge, skin change or tenderness.    HENT:     Head: Normocephalic and atraumatic.  Eyes:     General: No scleral icterus.    Conjunctiva/sclera: Conjunctivae normal.  Neck:     Thyroid: No thyromegaly.  Cardiovascular:     Rate and Rhythm: Normal rate and regular rhythm.     Heart sounds: No murmur heard. No friction rub. No gallop.   Pulmonary:     Effort: Pulmonary effort is normal. No respiratory distress.     Breath sounds: Normal breath sounds. No wheezing or rales.  Abdominal:     General: Bowel sounds are normal. There is no distension.     Palpations: Abdomen is soft. There is no mass.     Tenderness: There is no abdominal tenderness. There is no guarding or rebound.      Hernia: There is no hernia in the left inguinal area or right inguinal area.  Musculoskeletal:        General: No swelling or tenderness. Normal range of motion.     Cervical back: Normal range of motion and neck supple.  Lymphadenopathy:     Cervical: No cervical adenopathy.     Lower Body: No right inguinal adenopathy. No left inguinal adenopathy.  Neurological:     General: No focal deficit present.     Mental Status: She is alert and oriented to person, place, and time.     Cranial Nerves: No cranial nerve deficit.  Skin:    General: Skin is warm and dry.     Findings: No erythema or rash.  Psychiatric:        Mood and Affect: Mood normal.        Behavior: Behavior normal.        Judgment: Judgment normal.     Female chaperone present for pelvic and breast  portions of the physical exam  Results: AUDIT Questionnaire (screen for alcoholism): 4 PHQ-9: 0  Assessment: 50 y.o. [id number] female here for routine annual gynecologic examination  Plan: Problem List Items Addressed This Visit   None   Visit Diagnoses    Women's annual routine gynecological examination    -  Primary   Relevant Orders   Cytology - PAP   Screening for depression       Screening for alcoholism       Atypical squamous cells of undetermined significance (ASCUS) on Papanicolaou smear of cervix       Relevant Orders   Cytology - PAP      Screening: -- Blood pressure screen normal -- Weight  screening: normal -- Depression screening negative (PHQ-9) -- Nutrition: normal -- cholesterol screening: not due for screening -- osteoporosis screening: not due -- tobacco screening: not using -- alcohol screening: AUDIT questionnaire indicates low-risk usage. -- family history of breast cancer screening: done. not at high risk. -- no evidence of domestic violence or intimate partner violence. -- STD screening: gonorrhea/chlamydia NAAT not collected per patient request. -- pap smear collected per [facility]  guidelines -- HPV vaccination series: not eligilbe  Prentice Docker, MD 05/10/2020 4:02 PM

## 2020-05-15 LAB — CYTOLOGY - PAP
Comment: NEGATIVE
Diagnosis: NEGATIVE
High risk HPV: NEGATIVE

## 2020-12-09 NOTE — Telephone Encounter (Signed)
Mirena rcvd/charged 04/27/2018

## 2021-01-05 DIAGNOSIS — C50919 Malignant neoplasm of unspecified site of unspecified female breast: Secondary | ICD-10-CM

## 2021-01-05 DIAGNOSIS — Z17421 Hormone receptor negative with human epidermal growth factor receptor 2 negative status: Secondary | ICD-10-CM

## 2021-01-05 HISTORY — DX: Malignant neoplasm of unspecified site of unspecified female breast: C50.919

## 2021-01-05 HISTORY — PX: MASTECTOMY PARTIAL / LUMPECTOMY: SUR851

## 2021-01-05 HISTORY — DX: Hormone receptor negative with human epidermal growth factor receptor 2 negative status: Z17.421

## 2021-04-28 ENCOUNTER — Other Ambulatory Visit: Payer: Self-pay | Admitting: Obstetrics and Gynecology

## 2021-04-28 DIAGNOSIS — Z1231 Encounter for screening mammogram for malignant neoplasm of breast: Secondary | ICD-10-CM

## 2021-05-01 ENCOUNTER — Telehealth: Payer: Self-pay | Admitting: Family Medicine

## 2021-05-01 NOTE — Telephone Encounter (Signed)
Left message for pt to call office to make appt for annual in May 2023 ?

## 2021-05-22 ENCOUNTER — Other Ambulatory Visit: Payer: Self-pay

## 2021-05-22 ENCOUNTER — Telehealth: Payer: Self-pay

## 2021-05-22 DIAGNOSIS — Z1211 Encounter for screening for malignant neoplasm of colon: Secondary | ICD-10-CM

## 2021-05-22 MED ORDER — SUTAB 1479-225-188 MG PO TABS: 1.0000 | ORAL_TABLET | Freq: Once | ORAL | 0 refills | Status: AC

## 2021-05-22 NOTE — Telephone Encounter (Signed)
Gastroenterology Pre-Procedure Review  Request Date: 06/27/21 Requesting Physician: Dr. Servando Snare  PATIENT REVIEW QUESTIONS: The patient responded to the following health history questions as indicated:    1. Are you having any GI issues? yes (Reflux, Constipation) Office visit declined at this time 2. Do you have a personal history of Polyps? no 3. Do you have a family history of Colon Cancer or Polyps? no 4. Diabetes Mellitus? no 5. Joint replacements in the past 12 months?no 6. Major health problems in the past 3 months?no 7. Any artificial heart valves, MVP, or defibrillator?no    MEDICATIONS & ALLERGIES:    Patient reports the following regarding taking any anticoagulation/antiplatelet therapy:   Plavix, Coumadin, Eliquis, Xarelto, Lovenox, Pradaxa, Brilinta, or Effient? no Aspirin? no  Patient confirms/reports the following medications:  Current Outpatient Medications  Medication Sig Dispense Refill   Cholecalciferol (VITAMIN D3) 25 MCG (1000 UT) CAPS Take 1 capsule by mouth daily.     FA-Cyanocobalamin-B6-D-Ca-Aloe (D 1000 PLUS ALOE) TABS Take 1 tablet by mouth daily.     fexofenadine (ALLEGRA) 180 MG tablet Take 180 mg by mouth daily.     gabapentin (NEURONTIN) 300 MG capsule Take 1 capsule by mouth at bedtime.     ketotifen (ZADITOR) 0.025 % ophthalmic solution Place 1 drop into both eyes 2 (two) times daily.       lamoTRIgine (LAMICTAL) 200 MG tablet TK 1 T PO D  0   levonorgestrel (MIRENA) 20 MCG/24HR IUD 1 Intra Uterine Device (1 each total) by Intrauterine route once for 1 dose. 1 each 0   Melatonin 3 MG TABS TK 1 T PO HS PRN     meloxicam (MOBIC) 15 MG tablet Take 15 mg by mouth daily.     omeprazole (PRILOSEC) 20 MG capsule TAKE ONE CAPSULE BY MOUTH TWICE A DAY 60 capsule 5   Probiotic Product (PROBIOTIC FORMULA PO) Take 1 capsule by mouth 2 (two) times daily.      traZODone (DESYREL) 100 MG tablet Take 2 tablets by mouth daily.     triamcinolone (NASACORT ALLERGY 24HR)  55 MCG/ACT AERO nasal inhaler Place 2 sprays into the nose daily.     Turmeric 500 MG CAPS Take 1 capsule by mouth daily.     vitamin B-12 (CYANOCOBALAMIN) 500 MCG tablet Take 500 mcg by mouth daily.     No current facility-administered medications for this visit.    Patient confirms/reports the following allergies:  Allergies  Allergen Reactions   Codeine Sulfate Itching and Nausea And Vomiting    Says all narcotics cause itching and she tries to avoid   Sulfa Antibiotics Hives    Hives and fever at age of 35, has not had since then   Tape Itching    whelts   Latex Rash    Not severe, but avoids gloves  condoms    No orders of the defined types were placed in this encounter.   AUTHORIZATION INFORMATION Primary Insurance: 1D#: Group #:  Secondary Insurance: 1D#: Group #:  SCHEDULE INFORMATION: Date: 06/27/21 Time: Location: [facility]

## 2021-06-13 ENCOUNTER — Ambulatory Visit
Admission: RE | Admit: 2021-06-13 | Discharge: 2021-06-13 | Disposition: A | Discharge status: Home / Self Care | Payer: BC Managed Care – PPO | Source: Ambulatory Visit | Attending: Obstetrics and Gynecology | Admitting: Obstetrics and Gynecology

## 2021-06-13 DIAGNOSIS — Z1231 Encounter for screening mammogram for malignant neoplasm of breast: Secondary | ICD-10-CM | POA: Diagnosis present

## 2021-06-18 ENCOUNTER — Other Ambulatory Visit: Payer: Self-pay | Admitting: Obstetrics and Gynecology

## 2021-06-18 DIAGNOSIS — R928 Other abnormal and inconclusive findings on diagnostic imaging of breast: Secondary | ICD-10-CM

## 2021-06-19 ENCOUNTER — Encounter: Payer: Self-pay | Admitting: Gastroenterology

## 2021-06-27 ENCOUNTER — Encounter: Payer: Self-pay | Admitting: Gastroenterology

## 2021-06-27 ENCOUNTER — Other Ambulatory Visit: Payer: Self-pay

## 2021-06-27 ENCOUNTER — Ambulatory Visit
Admission: RE | Admit: 2021-06-27 | Discharge: 2021-06-27 | Disposition: A | Discharge status: Home / Self Care | Payer: BC Managed Care – PPO | Attending: Gastroenterology | Admitting: Gastroenterology

## 2021-06-27 ENCOUNTER — Encounter
Admission: RE | Disposition: A | Discharge status: Home / Self Care | Payer: Self-pay | Source: Home / Self Care | Attending: Gastroenterology

## 2021-06-27 ENCOUNTER — Ambulatory Visit: Payer: BC Managed Care – PPO | Admitting: Anesthesiology

## 2021-06-27 DIAGNOSIS — Z1211 Encounter for screening for malignant neoplasm of colon: Secondary | ICD-10-CM

## 2021-06-27 DIAGNOSIS — K621 Rectal polyp: Secondary | ICD-10-CM | POA: Diagnosis not present

## 2021-06-27 DIAGNOSIS — F319 Bipolar disorder, unspecified: Secondary | ICD-10-CM | POA: Diagnosis not present

## 2021-06-27 DIAGNOSIS — K6282 Dysplasia of anus: Secondary | ICD-10-CM | POA: Diagnosis not present

## 2021-06-27 DIAGNOSIS — K219 Gastro-esophageal reflux disease without esophagitis: Secondary | ICD-10-CM | POA: Diagnosis not present

## 2021-06-27 HISTORY — PX: COLONOSCOPY WITH PROPOFOL: SHX5780

## 2021-06-27 SURGERY — COLONOSCOPY WITH PROPOFOL
Anesthesia: General | Site: Rectum

## 2021-06-27 MED ORDER — ONDANSETRON HCL 4 MG/2ML IJ SOLN
4.0000 mg | Freq: Once | INTRAMUSCULAR | Status: AC
  Administered 2021-06-27: 4 mg via INTRAVENOUS

## 2021-06-27 MED ORDER — SODIUM CHLORIDE 0.9 % IV SOLN: INTRAVENOUS | Status: DC

## 2021-06-27 MED ORDER — ACETAMINOPHEN 325 MG PO TABS: 325.0000 mg | ORAL_TABLET | Freq: Once | ORAL | Status: DC

## 2021-06-27 MED ORDER — STERILE WATER FOR IRRIGATION IR SOLN
Status: DC | PRN
  Administered 2021-06-27: 150 mL

## 2021-06-27 MED ORDER — LIDOCAINE HCL (CARDIAC) PF 100 MG/5ML IV SOSY
PREFILLED_SYRINGE | INTRAVENOUS | Status: DC | PRN
  Administered 2021-06-27: 30 mg via INTRAVENOUS

## 2021-06-27 MED ORDER — LACTATED RINGERS IV SOLN: INTRAVENOUS | Status: DC

## 2021-06-27 MED ORDER — ACETAMINOPHEN 160 MG/5ML PO SOLN: 325.0000 mg | Freq: Once | ORAL | Status: DC

## 2021-06-27 MED ORDER — PROPOFOL 10 MG/ML IV BOLUS
INTRAVENOUS | Status: DC | PRN
  Administered 2021-06-27: 40 mg via INTRAVENOUS
  Administered 2021-06-27 (×3): 30 mg via INTRAVENOUS
  Administered 2021-06-27: 150 mg via INTRAVENOUS
  Administered 2021-06-27: 30 mg via INTRAVENOUS

## 2021-06-27 MED ORDER — STERILE WATER FOR IRRIGATION IR SOLN
Status: DC | PRN
  Administered 2021-06-27: 180 mL

## 2021-06-27 SURGICAL SUPPLY — 22 items
CLIP HMST 235XBRD CATH ROT (MISCELLANEOUS) IMPLANT
CLIP RESOLUTION 360 11X235 (MISCELLANEOUS)
ELECT REM PT RETURN 9FT ADLT (ELECTROSURGICAL)
ELECTRODE REM PT RTRN 9FT ADLT (ELECTROSURGICAL) IMPLANT
FORCEPS BIOP RAD 4 LRG CAP 4 (CUTTING FORCEPS) IMPLANT
GOWN CVR UNV OPN BCK APRN NK (MISCELLANEOUS) ×4 IMPLANT
GOWN ISOL THUMB LOOP REG UNIV (MISCELLANEOUS) ×4
INJECTOR VARIJECT VIN23 (MISCELLANEOUS) IMPLANT
KIT DEFENDO VALVE AND CONN (KITS) IMPLANT
KIT PRC NS LF DISP ENDO (KITS) ×2 IMPLANT
KIT PROCEDURE OLYMPUS (KITS) ×2
MANIFOLD NEPTUNE II (INSTRUMENTS) ×3 IMPLANT
MARKER SPOT ENDO TATTOO 5ML (MISCELLANEOUS) IMPLANT
PROBE APC STR FIRE (PROBE) IMPLANT
RETRIEVER NET ROTH 2.5X230 LF (MISCELLANEOUS) IMPLANT
SNARE COLD EXACTO (MISCELLANEOUS) IMPLANT
SNARE SHORT THROW 13M SML OVAL (MISCELLANEOUS) IMPLANT
SNARE SNG USE RND 15MM (INSTRUMENTS) IMPLANT
SPOT EX ENDOSCOPIC TATTOO (MISCELLANEOUS)
TRAP ETRAP POLY (MISCELLANEOUS) IMPLANT
VARIJECT INJECTOR VIN23 (MISCELLANEOUS)
WATER STERILE IRR 250ML POUR (IV SOLUTION) ×3 IMPLANT

## 2021-06-27 NOTE — Anesthesia Postprocedure Evaluation (Signed)
Anesthesia Post Note  Patient: Carolyn Clements  Procedure(s) Performed: COLONOSCOPY WITH PROPOFOL (Rectum)     Patient location during evaluation: PACU Anesthesia Type: General Level of consciousness: awake and alert and oriented Pain management: satisfactory to patient Vital Signs Assessment: post-procedure vital signs reviewed and stable Respiratory status: spontaneous breathing, nonlabored ventilation and respiratory function stable Cardiovascular status: blood pressure returned to baseline and stable Postop Assessment: Adequate PO intake and No signs of nausea or vomiting Anesthetic complications: no   No notable events documented.  Cherly Beach

## 2021-06-30 ENCOUNTER — Encounter: Payer: Self-pay | Admitting: Gastroenterology

## 2021-07-01 LAB — SURGICAL PATHOLOGY

## 2021-07-04 ENCOUNTER — Ambulatory Visit
Admission: RE | Admit: 2021-07-04 | Discharge: 2021-07-04 | Disposition: A | Discharge status: Home / Self Care | Payer: BC Managed Care – PPO | Source: Ambulatory Visit | Attending: Obstetrics and Gynecology | Admitting: Obstetrics and Gynecology

## 2021-07-04 DIAGNOSIS — R928 Other abnormal and inconclusive findings on diagnostic imaging of breast: Secondary | ICD-10-CM | POA: Diagnosis present

## 2021-07-10 ENCOUNTER — Other Ambulatory Visit: Payer: Self-pay | Admitting: Obstetrics and Gynecology

## 2021-07-10 DIAGNOSIS — N63 Unspecified lump in unspecified breast: Secondary | ICD-10-CM

## 2021-07-10 DIAGNOSIS — R928 Other abnormal and inconclusive findings on diagnostic imaging of breast: Secondary | ICD-10-CM

## 2021-07-21 ENCOUNTER — Telehealth: Payer: Self-pay | Admitting: Gastroenterology

## 2021-07-21 NOTE — Telephone Encounter (Signed)
Patient states she needs to make an appointment to go over results of colonoscopy. Patient wants to speak with DR [facility] nurse.

## 2021-07-25 ENCOUNTER — Ambulatory Visit
Admission: RE | Admit: 2021-07-25 | Discharge: 2021-07-25 | Disposition: A | Discharge status: Home / Self Care | Payer: BC Managed Care – PPO | Source: Ambulatory Visit | Attending: Obstetrics and Gynecology | Admitting: Obstetrics and Gynecology

## 2021-07-25 DIAGNOSIS — R928 Other abnormal and inconclusive findings on diagnostic imaging of breast: Secondary | ICD-10-CM | POA: Diagnosis present

## 2021-07-25 DIAGNOSIS — N63 Unspecified lump in unspecified breast: Secondary | ICD-10-CM | POA: Diagnosis present

## 2021-07-25 HISTORY — PX: BREAST BIOPSY: SHX20

## 2021-07-28 ENCOUNTER — Encounter: Payer: Self-pay | Admitting: *Deleted

## 2021-07-28 NOTE — Progress Notes (Signed)
Received referral for newly diagnosed breast cancer from [facility].  Navigation initiated.  Patient would like to go to [facility] for med onc and Careers adviser.  She is going to reach out to her PCP and have the referrals sent.

## 2021-08-05 ENCOUNTER — Encounter: Payer: Self-pay | Admitting: Diagnostic Radiology

## 2021-08-05 LAB — SURGICAL PATHOLOGY

## 2021-08-06 ENCOUNTER — Ambulatory Visit: Payer: BC Managed Care – PPO | Admitting: Gastroenterology

## 2021-08-06 ENCOUNTER — Encounter: Payer: Self-pay | Admitting: Gastroenterology

## 2021-08-06 VITALS — BP 120/77 | HR 66 | Temp 98.6°F | Wt 156.0 lb

## 2021-08-06 DIAGNOSIS — A630 Anogenital (venereal) warts: Secondary | ICD-10-CM | POA: Diagnosis not present

## 2021-08-06 NOTE — Progress Notes (Signed)
Primary Care Physician: [name], MD  Primary Gastroenterologist:  Dr. Lucilla Lame  Chief Complaint  Patient presents with   Follow-up    HPI: Carolyn Clements is a 51 y.o. female here for follow-up after having a colonoscopy.  The patient had a polyp in the rectum at the anal verge that was biopsied.  The biopsy showed:  CONDYLOMA WITH LOW-GRADE SQUAMOUS INTRAEPITHELIAL LESION   The patient also had a breast biopsy 2 weeks back that showed breast cancer.  She is now here to review the results of her colonoscopy.   Past Medical History:  Diagnosis Date   Anxiety    Bipolar 1 disorder ([place])    Enchondroma, multiple    GERD (gastroesophageal reflux disease)    IUD    [facility] for [facility]  Dr Kennon Rounds   Migraine headache    sinus related   Motion sickness    cars - long trips   MVP (mitral valve prolapse)    Saw cardiology for syncope 7/16.   PONV (postoperative nausea and vomiting)    Pyloric stenosis infant   Inguinal Hernia repair   Screening for cervical cancer April 2012   normal PAP    Wears contact lenses     Current Outpatient Medications  Medication Sig Dispense Refill   Cholecalciferol (VITAMIN D3) 25 MCG (1000 UT) CAPS Take 5,000 Units by mouth daily.     citalopram (CELEXA) 20 MG tablet Take 20 mg by mouth every morning.     fexofenadine (ALLEGRA) 180 MG tablet Take 180 mg by mouth daily.     gabapentin (NEURONTIN) 300 MG capsule Take 1 capsule by mouth at bedtime.     ketotifen (ZADITOR) 0.025 % ophthalmic solution Place 1 drop into both eyes 2 (two) times daily.       lamoTRIgine (LAMICTAL) 200 MG tablet TK 1 T PO D  0   levonorgestrel (MIRENA) 20 MCG/24HR IUD 1 Intra Uterine Device (1 each total) by Intrauterine route once for 1 dose. 1 each 0   meloxicam (MOBIC) 15 MG tablet Take 15 mg by mouth daily.     omeprazole (PRILOSEC) 20 MG capsule TAKE ONE CAPSULE BY MOUTH TWICE A DAY 60 capsule 5   Probiotic Product (PROBIOTIC FORMULA PO) Take 1  capsule by mouth 2 (two) times daily.      traZODone (DESYREL) 100 MG tablet Take 2 tablets by mouth daily.     triamcinolone (NASACORT ALLERGY 24HR) 55 MCG/ACT AERO nasal inhaler Place 2 sprays into the nose daily.     vitamin B-12 (CYANOCOBALAMIN) 500 MCG tablet Take 500 mcg by mouth daily.     No current facility-administered medications for this visit.    Allergies as of 08/06/2021 - Review Complete 08/06/2021  Allergen Reaction Noted   Codeine sulfate Itching and Nausea And Vomiting 05/03/2013   Sulfa antibiotics Hives 11/24/2010   Tape Itching 05/07/2015   Latex Rash 11/24/2010    ROS:  General: Negative for anorexia, weight loss, fever, chills, fatigue, weakness. ENT: Negative for hoarseness, difficulty swallowing , nasal congestion. CV: Negative for chest pain, angina, palpitations, dyspnea on exertion, peripheral edema.  Respiratory: Negative for dyspnea at rest, dyspnea on exertion, cough, sputum, wheezing.  GI: See history of present illness. GU:  Negative for dysuria, hematuria, urinary incontinence, urinary frequency, nocturnal urination.  Endo: Negative for unusual weight change.    Physical Examination:   BP 120/77   Pulse 66   Temp 98.6 F (37 C) (  Oral)   Wt 156 lb (70.8 kg)   BMI 28.08 kg/m   General: Well-nourished, well-developed in no acute distress.  Eyes: No icterus. Conjunctivae pink. Neuro: Alert and oriented x 3.  Grossly intact. Psych: Alert and cooperative, normal mood and affect.  Labs:    Imaging Studies: [place] LT BREAST BX W LOC DEV 1ST LESION IMG BX SPEC US GUIDE  Addendum Date: 07/28/2021   ADDENDUM REPORT: 07/28/2021 12:00 ADDENDUM: PATHOLOGY revealed: A. BREAST, LEFT AT 12:00, RETROAREOLAR; ULTRASOUND-GUIDED CORE NEEDLE BIOPSY: - INVASIVE MAMMARY CARCINOMA, NO SPECIAL TYPE. Size of invasive carcinoma: 10 mm in this sample. Grade 3. Ductal carcinoma in situ: Present, high-grade. Lymphovascular invasion: Not identified. Pathology results are  CONCORDANT with imaging findings, per Dr. Valentino Saxon. Pathology results and recommendations were discussed with patient via telephone on 07/28/2021. Patient reported biopsy site doing well with no adverse symptoms, and only slight tenderness at the site. Post biopsy care instructions were reviewed, questions were answered and my direct phone number was provided. Patient was instructed to call [facility] for any additional questions or concerns related to biopsy site. RECOMMENDATIONS: 1. Please schedule patient for RIGHT breast ultrasound guided biopsy for RIGHT breast mass. 2. Surgical consultation. Request for surgical consultation relayed to Casper Harrison RN at [facility] by Electa Sniff RN on 07/28/2021. 3. Consider bilateral breast MRI given patient's young age (51 yo) and to evaluate extent of breast disease. Pathology results reported by Electa Sniff RN on 07/28/2021. Electronically Signed   By: Valentino Saxon M.D.   On: 07/28/2021 12:00   Result Date: 07/28/2021 CLINICAL DATA:  Indeterminate LEFT breast mass EXAM: ULTRASOUND GUIDED LEFT BREAST CORE NEEDLE BIOPSY COMPARISON:  Previous exam(s). PROCEDURE: I met with the patient and we discussed the procedure of ultrasound-guided biopsy, including benefits and alternatives. We discussed the high likelihood of a successful procedure. We discussed the risks of the procedure, including infection, bleeding, tissue injury, clip migration, and inadequate sampling. Informed written consent was given. The usual time-out protocol was performed immediately prior to the procedure. Lesion quadrant: Upper outer quadrant Using sterile technique and 1% lidocaine and 1% lidocaine with epinephrine as local anesthetic, under direct ultrasound visualization, a 14 gauge spring-loaded device was used to perform biopsy of a mass at 12 o'clock in the retroareolar breast using a lateral approach. At the conclusion of the procedure a RIBBON shaped  tissue marker clip was deployed into the biopsy cavity. Follow up 2 view mammogram was performed and dictated separately. IMPRESSION: Ultrasound guided biopsy of retroareolar LEFT breast mass. No apparent complications. Electronically Signed: By: Valentino Saxon M.D. On: 07/25/2021 10:01   MM CLIP PLACEMENT LEFT  Result Date: 07/25/2021 CLINICAL DATA:  Status post ultrasound-guided biopsy EXAM: 3D DIAGNOSTIC LEFT MAMMOGRAM POST ULTRASOUND BIOPSY COMPARISON:  Previous exam(s). FINDINGS: 3D Mammographic images were obtained following ultrasound guided biopsy of a retroareolar LEFT breast mass. The RIBBON biopsy marking clip is in expected position at the site of biopsy. IMPRESSION: Appropriate positioning of the RIBBON shaped biopsy marking clip at the site of biopsy in the retroareolar breast. Final Assessment: Post Procedure Mammograms for Marker Placement Electronically Signed   By: Valentino Saxon M.D.   On: 07/25/2021 10:02   Assessment and Plan:   Conna Daisi Kentner is a 51 y.o. y/o female who comes in after having a colonoscopy with a condyloma found at the anal verge.  The lesion was biopsied but not removed.  The patient has been explained the pathophysiology  and recommendations about the condyloma and the recommendations that it should be addressed before it gets bigger or more difficult to remove.  The patient is being followed by [facility] for her breast cancer and has been given information about following up with a local surgeon for removal of this lesion.  The patient has been explained the plan agrees with it.     Lucilla Lame, MD. Marval Regal    Note: This dictation was prepared with Dragon dictation along with smaller phrase technology. Any transcriptional errors that result from this process are unintentional.

## 2021-09-03 ENCOUNTER — Ambulatory Visit: Payer: BC Managed Care – PPO | Admitting: Surgery

## 2021-12-01 ENCOUNTER — Ambulatory Visit
Admission: EM | Admit: 2021-12-01 | Discharge: 2021-12-01 | Disposition: A | Discharge status: Home / Self Care | Payer: BC Managed Care – PPO | Attending: Urgent Care | Admitting: Urgent Care

## 2021-12-01 DIAGNOSIS — B309 Viral conjunctivitis, unspecified: Secondary | ICD-10-CM

## 2021-12-01 MED ORDER — ERYTHROMYCIN 5 MG/GM OP OINT: TOPICAL_OINTMENT | OPHTHALMIC | 0 refills | Status: DC

## 2021-12-01 NOTE — ED Provider Notes (Signed)
[facility]    CSN: [id number] Arrival date & time: 12/01/21  0801      History   Chief Complaint Chief Complaint  Patient presents with   Eye Problem    bilateral    HPI Yanelly Raahi Bedrossian is a 51 y.o. female.   Pleasant 51 year old female presents today due to concern of bilateral eye redness.  States it started on Friday night.  She does have a history of allergies to cats, and was at a friend's house that evening who has a cat.  States she has been using her antihistamine eyedrops without any symptom resolution.  She is also getting over a sinus infection, completed her Augmentin 1 week ago.  She does have a history of breast cancer, had chemotherapy 1 week ago on Monday.  States that the redness seems to be improving, but is bothered by a thin film over her eye.  Denies any headache, photophobia.  Does have some nasal congestion but otherwise denies any additional URI symptoms.  No change in vision.  Some crusting in the morning otherwise denies mucopurulent discharge or tearing.  No itchiness or pain.   Eye Problem   Past Medical History:  Diagnosis Date   Anxiety    Bipolar 1 disorder ([place])    Enchondroma, multiple    GERD (gastroesophageal reflux disease)    IUD    [facility]  Dr Kennon Rounds   Migraine headache    sinus related   Motion sickness    cars - long trips   MVP (mitral valve prolapse)    Saw cardiology for syncope 7/16.   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   Fatigue 11/07/2013   Anemia, iron deficiency 11/22/2012    Generalized anxiety disorder 01/07/2012   GERD (gastroesophageal reflux disease)    Migraine headache     Past Surgical History:  Procedure Laterality Date   ARTHROSCOPIC REPAIR ACL     torn/repaired ACL, followed by patellar fractuer, [employer]   BREAST BIOPSY Left 07/25/2021   [place] Core BX 12:00 Ribbon clip - path pending   CESAREAN SECTION     2003 and 2004   COLONOSCOPY WITH PROPOFOL N/A 06/27/2021   Procedure: COLONOSCOPY WITH PROPOFOL;  Surgeon: Lucilla Lame, MD;  Location: [facility];  Service: Endoscopy;  Laterality: N/A;  Latex   HERNIA REPAIR  01/05/1974   IUD REMOVAL N/A 05/17/2013   Procedure: INTRAUTERINE DEVICE (IUD) REMOVAL;  Surgeon: [name], MD;  Location: [facility];  Service: Gynecology;  Laterality: N/A;  Replacement of IUD   PYLOROPLASTY  1974   TOE SURGERY Left 01/05/2002   Enchondroma removal - great toe   TONSILLECTOMY Bilateral 05/16/2015   Procedure: TONSILLECTOMY;  Surgeon: Margaretha Sheffield, MD;  Location: [facility];  Service: ENT;  Laterality: Bilateral;  Latex sensitivity    OB History     Gravida  3   Para  2   Term  2   Preterm      AB  1   Living  2      SAB  1   IAB      Ectopic      Multiple      Live Births  2            Home Medications    Prior to Admission medications   Medication Sig Start Date End Date Taking? Authorizing Provider  erythromycin ophthalmic ointment Place a 1/2 inch ribbon of ointment into the both lower eyelids three times daily for 5 days. 12/01/21  Yes Armie Moren L, PA  Cholecalciferol (VITAMIN D3) 25 MCG (1000 UT) CAPS Take 5,000 Units by mouth daily.    [provider]  citalopram (CELEXA) 20 MG tablet Take 20 mg by mouth every morning. 07/23/21   [provider]  fexofenadine (ALLEGRA) 180 MG tablet Take 180 mg by mouth daily.    [provider]  gabapentin (NEURONTIN) 300 MG capsule Take 1 capsule by mouth at bedtime. 12/27/18    [provider]  ketotifen (ZADITOR) 0.025 % ophthalmic solution Place 1 drop into both eyes 2 (two) times daily.      [provider]  lamoTRIgine (LAMICTAL) 200 MG tablet TK 1 T PO D 11/19/16   [provider]  levonorgestrel (MIRENA) 20 MCG/24HR IUD 1 Intra Uterine Device (1 each total) by Intrauterine route once for 1 dose. 04/27/18 04/26/24  Conard Novak, MD  meloxicam (MOBIC) 15 MG tablet Take 15 mg by mouth daily. 11/12/18   [provider]  omeprazole (PRILOSEC) 20 MG capsule TAKE ONE CAPSULE BY MOUTH TWICE A DAY 06/18/15   Sherlene Shams, MD  Probiotic Product (PROBIOTIC FORMULA PO) Take 1 capsule by mouth 2 (two) times daily.     [provider]  traZODone (DESYREL) 100 MG tablet Take 2 tablets by mouth daily. 12/27/18   [provider]  triamcinolone (NASACORT ALLERGY 24HR) 55 MCG/ACT AERO nasal inhaler Place 2 sprays into the nose daily.    [provider]  vitamin B-12 (CYANOCOBALAMIN) 500 MCG tablet Take 500 mcg by mouth daily.    [provider]    Family History Family History  Problem Relation Age of Onset   COPD Mother    Diabetes Mother    Hypertension Mother    Cancer Father        adenocarcinoma, nonsmoker   Cancer Maternal Grandfather        lung   Breast cancer Paternal Grandmother    Cancer Paternal Grandfather        lung    Social History Social History   Tobacco Use   Smoking status: Never   Smokeless tobacco: Never  Vaping Use   Vaping Use: Never used  Substance Use Topics   Alcohol use: Yes    Comment: 4-5 drinks on weekends   Drug use: No     Allergies   Codeine sulfate, Sulfa antibiotics, Tape, and Latex   Review of Systems Review of Systems As per HPI  Physical Exam Triage Vital Signs ED Triage Vitals  Enc Vitals Group     BP 12/01/21 0813 112/75     Pulse Rate 12/01/21 0813 77     Resp --      Temp 12/01/21 0813 98.5 F (36.9 C)     Temp Source  12/01/21 0813 Oral     SpO2 12/01/21 0813 94 %     Weight 12/01/21 0812 162 lb (73.5 kg)     Height  12/01/21 0812 5\' 2"  (1.575 m)     Head Circumference --      Peak Flow --      Pain Score 12/01/21 0812 0     Pain Loc --      Pain Edu? --      Excl. in [place]? --    No data found.  Updated Vital Signs BP 112/75 (BP Location: Right Arm)   Pulse 77   Temp 98.5 F (36.9 C) (Oral)   Ht 5\' 2"  (1.575 m)   Wt 162 lb (73.5 kg)   SpO2 94%   BMI 29.63 kg/m   Visual Acuity Right Eye Distance:   Left Eye Distance:   Bilateral Distance:    Right Eye Near:   Left Eye Near:    Bilateral Near:     Physical Exam Vitals and nursing note reviewed.  Constitutional:      General: She is not in acute distress.    Appearance: Normal appearance. She is well-developed. She is not ill-appearing, toxic-appearing or diaphoretic.  HENT:     Head: Normocephalic and atraumatic.     Right Ear: External ear normal.     Left Ear: External ear normal.  Eyes:     General: Lids are normal. Lids are everted, no foreign bodies appreciated. Vision grossly intact. Gaze aligned appropriately. No allergic shiner, visual field deficit or scleral icterus.       Right eye: No foreign body, discharge or hordeolum.        Left eye: No foreign body, discharge or hordeolum.     Extraocular Movements: Extraocular movements intact.     Right eye: Normal extraocular motion and no nystagmus.     Left eye: Normal extraocular motion and no nystagmus.     Conjunctiva/sclera:     Right eye: Right conjunctiva is injected. No chemosis, exudate or hemorrhage.    Left eye: Left conjunctiva is injected. No chemosis, exudate or hemorrhage.    Pupils: Pupils are equal, round, and reactive to light.     Right eye: Pupil is round, reactive and not sluggish.     Left eye: Pupil is round, reactive and not sluggish.     Visual Fields: Right eye visual fields normal and left eye visual fields normal.     Comments: No ciliary flush   Cardiovascular:     Rate and Rhythm: Normal rate and regular rhythm.     Heart sounds: No murmur heard. Pulmonary:     Effort: Pulmonary effort is normal. No respiratory distress.     Breath sounds: Normal breath sounds.  Abdominal:     Palpations: Abdomen is soft.     Tenderness: There is no abdominal tenderness.  Musculoskeletal:        General: No swelling.     Cervical back: Neck supple.  Lymphadenopathy:     Cervical: No cervical adenopathy.  Skin:    General: Skin is warm and dry.     Capillary Refill: Capillary refill takes less than 2 seconds.  Neurological:     Mental Status: She is alert.  Psychiatric:        Mood and Affect: Mood normal.      UC Treatments / Results  Labs (all labs ordered are listed, but only abnormal results are displayed) Labs Reviewed - No data to display  EKG   Radiology No results found.  Procedures Procedures (including critical care time)  Medications Ordered in UC Medications - No data to display  Initial  Impression / Assessment and Plan / UC Course  I have reviewed the triage vital signs and the nursing notes.  Pertinent labs & imaging results that were available during my care of the patient were reviewed by me and considered in my medical decision making (see chart for details).     Acute viral conjunctivitis -will do 5-day course of topical erythromycin ointment to prevent secondary bacterial infection.  Patient has a follow-up appoint with ENT on Friday, will have them reassess her response to therapy at that point.  Prevention of spread discussed with patient.  Handouts provided.  Return to Clinic precautions also discussed.  Final Clinical Impressions(s) / UC Diagnoses   Final diagnoses:  Acute viral conjunctivitis of both eyes     Discharge Instructions      You have viral pinkeye, which is most commonly caused by Adenovirus. THIS IS HIGHLY CONTAGIOUS. Please avoid touching your eye; if you do, [place]! I have prescribed erythromycin eye ointment to PREVENT secondary bacterial infection. Use this on both eyes three times daily x 5 days. If the ointment is too hard to administer, hold it in your warm hands for 5 minutes prior and it will act more like a drop. Warm moist washcloths with Wynetta Emery and Johnson baby shampoo can be used to gently massage and cleans to eyes. Return to clinic if any fever, eye pain, change in vision.      ED Prescriptions     Medication Sig Dispense Auth. Provider   erythromycin ophthalmic ointment Place a 1/2 inch ribbon of ointment into the both lower eyelids three times daily for 5 days. 3.5 g Corday Wyka L, [place]      PDMP not reviewed this encounter.   Chaney Malling, [place] 12/01/21 0830

## 2021-12-01 NOTE — ED Triage Notes (Signed)
Patient reports that she has bilateral eye redness. Patient reports that she has an ENT appt Friday due to having increased sinus infection.   Patient reports that she thinks it might have came from that. She reports that she was in the house all weekend due to having chemo.

## 2021-12-01 NOTE — Discharge Instructions (Signed)
You have viral pinkeye, which is most commonly caused by Adenovirus. THIS IS HIGHLY CONTAGIOUS. Please avoid touching your eye; if you do, [facility] YOUR HANDS! I have prescribed erythromycin eye ointment to PREVENT secondary bacterial infection. Use this on both eyes three times daily x 5 days. If the ointment is too hard to administer, hold it in your warm hands for 5 minutes prior and it will act more like a drop. Warm moist washcloths with Laural Benes and Johnson baby shampoo can be used to gently massage and cleans to eyes. Return to clinic if any fever, eye pain, change in vision.

## 2022-05-15 ENCOUNTER — Other Ambulatory Visit: Payer: Self-pay | Admitting: Family Medicine

## 2022-05-15 DIAGNOSIS — R319 Hematuria, unspecified: Secondary | ICD-10-CM

## 2022-05-25 ENCOUNTER — Ambulatory Visit
Admission: RE | Admit: 2022-05-25 | Discharge: 2022-05-25 | Disposition: A | Discharge status: Home / Self Care | Payer: Managed Care, Other (non HMO) | Source: Ambulatory Visit | Attending: Family Medicine | Admitting: Family Medicine

## 2022-05-25 DIAGNOSIS — R319 Hematuria, unspecified: Secondary | ICD-10-CM

## 2022-05-25 MED ORDER — IOPAMIDOL (ISOVUE-300) INJECTION 61%
100.0000 mL | Freq: Once | INTRAVENOUS | Status: AC | PRN
  Administered 2022-05-25: 100 mL via INTRAVENOUS

## 2022-06-15 ENCOUNTER — Other Ambulatory Visit: Payer: Self-pay

## 2022-06-15 DIAGNOSIS — R319 Hematuria, unspecified: Secondary | ICD-10-CM

## 2022-06-16 ENCOUNTER — Ambulatory Visit: Payer: Managed Care, Other (non HMO) | Admitting: Urology

## 2022-06-16 ENCOUNTER — Encounter: Payer: Self-pay | Admitting: Urology

## 2022-06-16 VITALS — BP 114/74 | HR 78 | Ht 62.0 in | Wt 173.0 lb

## 2022-06-16 DIAGNOSIS — R3121 Asymptomatic microscopic hematuria: Secondary | ICD-10-CM | POA: Diagnosis not present

## 2022-06-16 NOTE — Progress Notes (Signed)
06/16/22 3:43 PM   Carolyn Clements 08-16-1970 [id number]  CC: Microscopic hematuria  HPI: 52 year old female with recent history of breast cancer referred for microscopic hematuria with 4 and 8 RBCs seen on recent urine samples.  CT urogram was ordered by PCP which was benign.  She denies any gross hematuria, denies any urinary symptoms.  Family history of bladder cancer.   PMH: Past Medical History:  Diagnosis Date   Anxiety    Bipolar 1 disorder ([facility])    Enchondroma, multiple    GERD (gastroesophageal reflux disease)    IUD    [facility]  Dr Shawnie Pons   Migraine headache    sinus related   Motion sickness    cars - long trips   MVP (mitral valve prolapse)    Saw cardiology for syncope 7/16.   PONV (postoperative nausea and vomiting)    Pyloric stenosis infant   Inguinal Hernia repair   Screening for cervical cancer April 2012   normal PAP    Wears contact lenses     Surgical History: Past Surgical History:  Procedure Laterality Date   ARTHROSCOPIC REPAIR ACL     torn/repaired ACL, followed by patellar fractuer, high school [employer]   BREAST BIOPSY Left 07/25/2021   [place] Core BX 12:00 Ribbon clip - path pending   CESAREAN SECTION     2003 and 2004   COLONOSCOPY WITH PROPOFOL N/A 06/27/2021   Procedure: COLONOSCOPY WITH PROPOFOL;  Surgeon: Midge Minium, MD;  [facility];  Service: Endoscopy;  Laterality: N/A;  Latex   HERNIA REPAIR  01/05/1974   IUD REMOVAL N/A 05/17/2013   Procedure: INTRAUTERINE DEVICE (IUD) REMOVAL;  Surgeon: Reva Bores, MD;  Location: [facility];  Service: Gynecology;  Laterality: N/A;  Replacement of IUD   PYLOROPLASTY  1974   TOE SURGERY Left 01/05/2002   Enchondroma removal - great toe   TONSILLECTOMY Bilateral 05/16/2015   Procedure: TONSILLECTOMY;  Surgeon: Vernie Murders, MD;  Location: [facility];  Service: ENT;  Laterality: Bilateral;  Latex sensitivity   Family History: Family History   Problem Relation Age of Onset   COPD Mother    Diabetes Mother    Hypertension Mother    Cancer Father        adenocarcinoma, nonsmoker   Cancer Maternal Grandfather        lung   Breast cancer Paternal Grandmother    Cancer Paternal Grandfather        lung    Social History:  reports that she has never smoked. She has never been exposed to tobacco smoke. She has never used smokeless tobacco. She reports current alcohol use. She reports that she does not use drugs.  Physical Exam: BP 114/74   Pulse 78   Ht 5\' 2"  (1.575 m)   Wt 173 lb (78.5 kg)   BMI 31.64 kg/m    Constitutional:  Alert and oriented, No acute distress. Cardiovascular: No clubbing, cyanosis, or edema. Respiratory: Normal respiratory effort, no increased work of breathing. GI: Abdomen is soft, nontender, nondistended, no abdominal masses   Laboratory Data: See HPI  Pertinent Imaging: I have personally viewed and interpreted the CT urogram showing no urologic abnormalities.  Assessment & Plan:   52 year old female recently treated for breast cancer with 2 episodes of microscopic hematuria.  We discussed common possible etiologies of microscopic hematuria including idiopathic, urolithiasis, medical renal disease, and malignancy. We discussed the new asymptomatic microscopic hematuria guidelines and risk  categories of low, intermediate, and high risk that are based on age, risk factors like smoking, and degree of microscopic hematuria. We discussed work-up can range from repeat urinalysis, renal ultrasound and cystoscopy, to CT urogram and cystoscopy.  Using shared decision making, she would like to pursue cystoscopy for completion of microscopic hematuria workup   Legrand Rams, MD 06/16/2022  [facility] [address] [place], [place] [address] [phone]

## 2022-06-16 NOTE — Patient Instructions (Signed)

## 2022-07-12 ENCOUNTER — Ambulatory Visit
Admission: EM | Admit: 2022-07-12 | Discharge: 2022-07-12 | Disposition: A | Discharge status: Home / Self Care | Payer: Managed Care, Other (non HMO) | Attending: Physician Assistant | Admitting: Physician Assistant

## 2022-07-12 ENCOUNTER — Ambulatory Visit: Payer: Managed Care, Other (non HMO)

## 2022-07-12 DIAGNOSIS — W540XXA Bitten by dog, initial encounter: Secondary | ICD-10-CM

## 2022-07-12 DIAGNOSIS — S62639B Displaced fracture of distal phalanx of unspecified finger, initial encounter for open fracture: Secondary | ICD-10-CM | POA: Diagnosis not present

## 2022-07-12 DIAGNOSIS — T07XXXA Unspecified multiple injuries, initial encounter: Secondary | ICD-10-CM | POA: Diagnosis not present

## 2022-07-12 NOTE — ED Triage Notes (Signed)
Pt was at home when her two dogs began to fight. Pt states she has a Aussie/Pitt mix and a Pitt/Boxer mix.  Pt states that her dogs begin to fight and she broke them up.  Pt was bit along left hand, right hand, and right forearm.

## 2022-07-12 NOTE — ED Notes (Signed)
Patient is being discharged from the Urgent Care and sent to the Emergency Department via POV . Per Eusebio Friendly PA, patient is in need of higher level of care due to complex fracture to the left thumb. Patient is aware and verbalizes understanding of plan of care.  Vitals:   07/12/22 1121  BP: 125/83  Pulse: 72  Temp: 98.2 F (36.8 C)  SpO2: 98%

## 2022-07-12 NOTE — Discharge Instructions (Addendum)
-  Please go to ER for open fracture of left thumb. The acrylic  nail needs to come off and the area thoroughly cleansed. -We reported the dog bite already. You'll be contacted for more info  You have been advised to follow up immediately in the emergency department for concerning signs.symptoms. If you declined EMS transport, please have a family member take you directly to the ED at this time. Do not delay. Based on concerns about condition, if you do not follow up in th e ED, you may risk poor outcomes including worsening of condition, delayed treatment and potentially life threatening issues. If you have declined to go to the ED at this time, you should call your PCP immediately to set up a follow up appointment.  Go to ED for red flag symptoms, including; fevers you cannot reduce with Tylenol/Motrin, severe headaches, vision changes, numbness/weakness in part of the body, lethargy, confusion, intractable vomiting, severe dehydration, chest pain, breathing difficulty, severe persistent abdominal or pelvic pain, signs of severe infection (increased redness, swelling of an area), feeling faint or passing out, dizziness, etc. You should especially go to the ED for sudden acute worsening of condition if you do not elect to go at this time.

## 2022-07-12 NOTE — ED Provider Notes (Signed)
[facility]    CSN: [id number] Arrival date & time: 07/12/22  1045      History   Chief Complaint Chief Complaint  Patient presents with   Animal Bite    HPI Carolyn Clements is a 52 y.o. female presenting for multiple dog bites/puncture wounds of bilateral arms.  Patient states she was breaking up a fight between her dogs at 4 AM this morning.  Notably she has a large laceration with exposed fatty tissue of the right ventral forearm and puncture through the left thumbnail.  Patient has fake acrylic nails.  Reports bleeding under the nail.  Reports that she has throbbing pain and swelling of the left thumb.  Patient states she is up-to-date with her tetanus immunization and her dogs are vaccinated fully. Took a shower after injuries and bandaged wounds.  HPI  Past Medical History:  Diagnosis Date   Anxiety    Bipolar 1 disorder (HCC)    Enchondroma, multiple    GERD (gastroesophageal reflux disease)    IUD    [facility]  Dr Shawnie Pons   Migraine headache    sinus related   Motion sickness    cars - long trips   MVP (mitral valve prolapse)    Saw cardiology for syncope 7/16.   PONV (postoperative nausea and vomiting)    Pyloric stenosis infant   Inguinal Hernia repair   Screening for cervical cancer April 2012   normal PAP    Wears contact lenses     Patient Active Problem List   Diagnosis Date Noted   Encounter for screening colonoscopy    Rectal polyp    Dyspareunia in female 03/26/2019   Bipolar 1 disorder (HCC) 03/26/2019   Hemorrhoids 12/20/2014   Nausea 12/20/2014   Congenital enchondromatosis 07/03/2014   Fatigue 11/07/2013   Anemia, iron deficiency 11/22/2012   Generalized anxiety disorder 01/07/2012   GERD (gastroesophageal reflux disease)    Migraine headache     Past Surgical History:  Procedure Laterality Date   ARTHROSCOPIC REPAIR ACL     torn/repaired ACL, followed by patellar fractuer, [employer]   BREAST  BIOPSY Left 07/25/2021   [place] Core BX 12:00 Ribbon clip - path pending   CESAREAN SECTION     2003 and 2004   COLONOSCOPY WITH PROPOFOL N/A 06/27/2021   Procedure: COLONOSCOPY WITH PROPOFOL;  Surgeon: Midge Minium, MD;  Location: [facility];  Service: Endoscopy;  Laterality: N/A;  Latex   HERNIA REPAIR  01/05/1974   IUD REMOVAL N/A 05/17/2013   Procedure: INTRAUTERINE DEVICE (IUD) REMOVAL;  Surgeon: Reva Bores, MD;  Location: [facility];  Service: Gynecology;  Laterality: N/A;  Replacement of IUD   PYLOROPLASTY  1974   TOE SURGERY Left 01/05/2002   Enchondroma removal - great toe   TONSILLECTOMY Bilateral 05/16/2015   Procedure: TONSILLECTOMY;  Surgeon: Vernie Murders, MD;  Location: [facility];  Service: ENT;  Laterality: Bilateral;  Latex sensitivity    OB History     Gravida  3   Para  2   Term  2   Preterm      AB  1   Living  2      SAB  1   IAB      Ectopic      Multiple      Live Births  2            Home Medications    Prior to  Admission medications   Medication Sig Start Date End Date Taking? Authorizing Provider  citalopram (CELEXA) 20 MG tablet Take 20 mg by mouth every morning. 07/23/21  Yes [provider]  fexofenadine (ALLEGRA) 180 MG tablet Take 180 mg by mouth daily.   Yes [provider]  gabapentin (NEURONTIN) 300 MG capsule Take 1 capsule by mouth at bedtime. 12/27/18  Yes [provider]  ketotifen (ZADITOR) 0.025 % ophthalmic solution Place 1 drop into both eyes 2 (two) times daily.     Yes [provider]  lamoTRIgine (LAMICTAL) 200 MG tablet TK 1 T PO D 11/19/16  Yes [provider]  levonorgestrel (MIRENA) 20 MCG/24HR IUD 1 Intra Uterine Device (1 each total) by Intrauterine route once for 1 dose. 04/27/18 04/26/24 Yes Conard Novak, MD  omeprazole (PRILOSEC) 20 MG capsule TAKE ONE CAPSULE BY MOUTH TWICE A DAY 06/18/15  Yes Sherlene Shams, MD  traZODone (DESYREL) 100 MG  tablet Take 2 tablets by mouth daily. 12/27/18  Yes [provider]  erythromycin ophthalmic ointment Place a 1/2 inch ribbon of ointment into the both lower eyelids three times daily for 5 days. 12/01/21   Crain, Whitney L, PA  meloxicam (MOBIC) 15 MG tablet Take 15 mg by mouth daily. 11/12/18   [provider]  Probiotic Product (PROBIOTIC FORMULA PO) Take 1 capsule by mouth 2 (two) times daily.     [provider]  triamcinolone (NASACORT ALLERGY 24HR) 55 MCG/ACT AERO nasal inhaler Place 2 sprays into the nose daily.    [provider]    Family History Family History  Problem Relation Age of Onset   COPD Mother    Diabetes Mother    Hypertension Mother    Cancer Father        adenocarcinoma, nonsmoker   Cancer Maternal Grandfather        lung   Breast cancer Paternal Grandmother    Cancer Paternal Grandfather        lung    Social History Social History   Tobacco Use   Smoking status: Never    Passive exposure: Never   Smokeless tobacco: Never  Vaping Use   Vaping Use: Never used  Substance Use Topics   Alcohol use: Yes    Comment: 4-5 drinks on weekends   Drug use: No     Allergies   Codeine sulfate, Sulfa antibiotics, Tape, and Latex   Review of Systems Review of Systems  Musculoskeletal:  Positive for arthralgias and joint swelling.  Skin:  Positive for color change and wound.  Neurological:  Negative for weakness and numbness.     Physical Exam Triage Vital Signs ED Triage Vitals  Enc Vitals Group     BP      Pulse      Resp      Temp      Temp src      SpO2      Weight      Height      Head Circumference      Peak Flow      Pain Score      Pain Loc      Pain Edu?      Excl. in GC?    No data found.  Updated Vital Signs BP 125/83 (BP Location: Left Arm)   Pulse 72   Temp 98.2 F (36.8 C)   Ht 5\' 2"  (1.575 m)   Wt 165 lb (74.8 kg)  SpO2 98%   BMI 30.18 kg/m   Physical Exam Vitals and nursing  note reviewed.  Constitutional:      General: She is not in acute distress.    Appearance: Normal appearance. She is not ill-appearing or toxic-appearing.  HENT:     Head: Normocephalic and atraumatic.  Eyes:     General: No scleral icterus.       Right eye: No discharge.        Left eye: No discharge.     Conjunctiva/sclera: Conjunctivae normal.  Cardiovascular:     Rate and Rhythm: Normal rate.  Pulmonary:     Effort: Pulmonary effort is normal. No respiratory distress.  Musculoskeletal:     Cervical back: Neck supple.  Skin:    General: Skin is dry.     Findings: Ecchymosis and wound present.     Comments: Multiple puncture wounds/lacerations/abrasions of bilateral hands and arms. Contusion and deep laceration ~4 cm of right ventral forearm with exposed fatty tissue. Puncture through nail of left thumb with bleeding.  Neurological:     General: No focal deficit present.     Mental Status: She is alert. Mental status is at baseline.     Motor: No weakness.     Gait: Gait normal.  Psychiatric:        Mood and Affect: Mood normal.        Behavior: Behavior normal.        Thought Content: Thought content normal.           UC Treatments / Results  Labs (all labs ordered are listed, but only abnormal results are displayed) Labs Reviewed - No data to display  EKG   Radiology DG Finger Thumb Left  Result Date: 07/12/2022 CLINICAL DATA:  Dog bite through the nail of the left thumb. EXAM: LEFT THUMB 2+V COMPARISON:  None Available. FINDINGS: Nondisplaced fracture of the distal tuft. No other fracture. Joints are normally aligned. Puncture wound to the nail evident on the lateral view. No radiopaque foreign body. IMPRESSION: 1. Nondisplaced fracture of the distal tuft of the left thumb. No dislocation. No radiopaque foreign body. Electronically Signed   By: Amie Portland M.D.   On: 07/12/2022 11:37    Procedures Procedures (including critical care time)  Medications  Ordered in UC Medications - No data to display  Initial Impression / Assessment and Plan / UC Course  I have reviewed the triage vital signs and the nursing notes.  Pertinent labs & imaging results that were available during my care of the patient were reviewed by me and considered in my medical decision making (see chart for details).   52 year old female presents for multiple puncture wounds/lacerations and abrasions of bilateral hands and arms.  Patient reports breaking up a fight between her dogs at 4 AM this morning.  She says she is up-to-date with tetanus immunization and her dogs are up-to-date with her vaccinations including rabies.  See images in chart.  Patient has numerous punctures, lacerations and abrasions.  Notably she has a large laceration about 4 cm with surrounding ecchymosis and exposed fatty tissue of the right ventral forearm.  Also she has puncture through the left thumbnail with bleeding.  All wounds thoroughly cleaned with antiseptic wound cleanser.  Dog bite reported to animal control by nursing staff.  X-ray of left thumb obtained to assess for underlying bony injury.  X-ray of thumb shows distal tuft fracture.  Reviewed this result with patient.  Given that she does  have multiple lacerations and a puncture wound, consistent with open fracture.  Patient has acrylic nail over her actual nail.  Advised this will need to be removed and the area thoroughly cleansed.  Advised going to emergency department at this time.  Patient is understanding and agreeable.  Wounds bandaged.  Patient [facility] or [facility] ED.   Final Clinical Impressions(s) / UC Diagnoses   Final diagnoses:  Open fracture of tuft of distal phalanx of finger  Dog bite, initial encounter  Multiple lacerations     Discharge Instructions      -Please go to ER for open fracture of left thumb. The acrylic  nail needs to come off and the area thoroughly cleansed. -We reported the dog bite already.  You'll be contacted for more info  You have been advised to follow up immediately in the emergency department for concerning signs.symptoms. If you declined EMS transport, please have a family member take you directly to the ED at this time. Do not delay. Based on concerns about condition, if you do not follow up in th e ED, you may risk poor outcomes including worsening of condition, delayed treatment and potentially life threatening issues. If you have declined to go to the ED at this time, you should call your PCP immediately to set up a follow up appointment.  Go to ED for red flag symptoms, including; fevers you cannot reduce with Tylenol/Motrin, severe headaches, vision changes, numbness/weakness in part of the body, lethargy, confusion, intractable vomiting, severe dehydration, chest pain, breathing difficulty, severe persistent abdominal or pelvic pain, signs of severe infection (increased redness, swelling of an area), feeling faint or passing out, dizziness, etc. You should especially go to the ED for sudden acute worsening of condition if you do not elect to go at this time.      ED Prescriptions   None    PDMP not reviewed this encounter.   Shirlee Latch, PA-C 07/12/22 1445

## 2022-07-13 ENCOUNTER — Telehealth: Payer: Self-pay

## 2022-07-13 NOTE — Transitions of Care (Post Inpatient/ED Visit) (Unsigned)
I spoke with Carolyn Clements and she was seen at [facility] and ED on 07/12/22. Carolyn Clements was breaking up fight between her dogs which were vaccinated and Carolyn Clements has fx lt thumb.  I did not review med list Carolyn Clements said her PCP was Debbra Riding PA at [facility] and Carolyn Clements requested taking Dr Darrick Huntsman off as PCP. Carolyn Clements has appt with hand specialist on 07/14/22 and Carolyn Clements is aware how to contact [facility] if appt needed. Carolyn Clements last saw Dr Darrick Huntsman 11/24/2016. Sending note to Dr Darrick Huntsman as Lorain Childes and  did remove Dr Darrick Huntsman as PCP per pts request.    07/13/2022  Name: Dalin Keath MRN: [id number] DOB: 09/05/1970  Today's TOC FU Call Status: Today's TOC FU Call Status:: Successful TOC FU Call Competed TOC FU Call Complete Date: 07/13/22  Transition Care Management Follow-up Telephone Call Date of Discharge: 07/12/22 Discharge Facility: Other ([facility]) Name of Other ([facility]) Discharge Facility: [facility] Type of Discharge: Emergency Department Reason for ED Visit: Other: Any questions or concerns?: No  Items Reviewed: Did you receive and understand the discharge instructions provided?: Yes Medications obtained,verified, and reconciled?: Partial Review Completed (did not review med list Carolyn Clements said her PCP was Debbra Riding PA at [facility] and Carolyn Clements rewqueswt taking Dr Leafy Ro as PCP. Carolyn Clements has appt with hand specialist on 07/14/22  and Carolyn Clements is aware how to contact [facility] if appt needed.) Any new allergies since your discharge?: No Dietary orders reviewed?: NA  Medications Reviewed Today: Medications Reviewed Today     Reviewed by Hulan Fray, CMA (Certified Medical Assistant) on 07/12/22 at 1121  Med List Status: <None>   Medication Order Taking? Sig Documenting Provider Last Dose Status Informant  citalopram (CELEXA) 20 MG tablet [id number]  Take 20 mg by mouth every morning. [provider]  Active   erythromycin ophthalmic ointment [id number]  Place a 1/2 inch ribbon of ointment into the both  lower eyelids three times daily for 5 days. Guy Sandifer L, PA  Consider Medication Status and Discontinue   fexofenadine (ALLEGRA) 180 MG tablet [id number]  Take 180 mg by mouth daily. [provider]  Active   gabapentin (NEURONTIN) 300 MG capsule [id number]  Take 1 capsule by mouth at bedtime. [provider]  Active   ketotifen (ZADITOR) 0.025 % ophthalmic solution [id number]  Place 1 drop into both eyes 2 (two) times daily.   [provider]  Active Self  lamoTRIgine (LAMICTAL) 200 MG tablet [id number]  TK 1 T PO D [provider]  Active   levonorgestrel (MIRENA) 20 MCG/24HR IUD [id number]  1 Intra Uterine Device (1 each total) by Intrauterine route once for 1 dose. Conard Novak, MD  Active   meloxicam [facility]) 15 MG tablet [id number]  Take 15 mg by mouth daily. [provider]  Consider Medication Status and Discontinue   omeprazole (PRILOSEC) 20 MG capsule [id number]  TAKE ONE CAPSULE BY MOUTH TWICE A DAY Sherlene Shams, MD  Active   Probiotic Product (PROBIOTIC FORMULA PO) [id number]  Take 1 capsule by mouth 2 (two) times daily.  [provider]  Consider Medication Status and Discontinue Self  traZODone (DESYREL) 100 MG tablet [id number]  Take 2 tablets by mouth daily. [provider]  Active   triamcinolone (NASACORT ALLERGY 24HR) 55 MCG/ACT AERO nasal inhaler [id number]  Place 2 sprays into the nose daily. [provider]  Consider Medication Status and Discontinue Self  Home Care and Equipment/Supplies: Were Home Health Services Ordered?: NA Any new equipment or medical supplies ordered?: NA  Functional Questionnaire:    Follow up appointments reviewed: PCP Follow-up appointment confirmed?: NA Specialist Hospital Follow-up appointment confirmed?: Yes Date of Specialist follow-up appointment?: 07/14/22 Follow-Up Specialty Provider:: hand specialist Do you need transportation to your follow-up  appointment?: No Do you understand care options if your condition(s) worsen?: Yes-patient verbalized understanding    SIGNATURE Lewanda Rife, LPN

## 2022-07-14 ENCOUNTER — Other Ambulatory Visit: Payer: Managed Care, Other (non HMO) | Admitting: Urology

## 2023-02-02 ENCOUNTER — Telehealth: Payer: Self-pay | Admitting: Gastroenterology

## 2023-02-02 NOTE — Telephone Encounter (Signed)
Carolyn Clements called in to schedule her procedure. She procedure was supposed to be done in 2023 but she found out that she had breast cancer and she had to put that procedure on hold. She said that now she is ready to schedule her procedure with one of the doctors because Dr. Servando Snare said that he doesn't do that procedure. I inform her that Dr. Servando Snare have went over her finding with her and he recommend that she see a general surgeon to have her procedure. I gave her the name and number to Dr. Everlene Farrier office for her to have her procedure done.

## 2023-03-08 ENCOUNTER — Encounter: Payer: Self-pay | Admitting: Surgery

## 2023-03-08 ENCOUNTER — Ambulatory Visit (INDEPENDENT_AMBULATORY_CARE_PROVIDER_SITE_OTHER): Payer: Managed Care, Other (non HMO) | Admitting: Surgery

## 2023-03-08 VITALS — BP 114/74 | HR 74 | Temp 98.0°F | Ht 62.0 in | Wt 155.0 lb

## 2023-03-08 DIAGNOSIS — K621 Rectal polyp: Secondary | ICD-10-CM | POA: Diagnosis not present

## 2023-03-08 NOTE — Patient Instructions (Signed)
 You have requested to have polyp removal surgery today. This will be scheduled at [facility] with Dr Everlene Farrier.  Please review the information below and your [facility] Information. Our surgery scheduler will call you to review surgery date and to go over information.  If you have FMLA or disability paperwork that needs filled out you may drop this off at our office or this can be faxed to [phone].  You will be required to do 2 enemas prior to your surgery. The first will be the night prior and the second will be done the morning of surgery.  Constipation is going to be your biggest obstacle following surgery. Use all stool softeners and laxatives as prescribed after your surgery and be sure to drink 72 ounces of water or more every day. This will help to avoid constipation. If you do all of this and you are still having difficulty, please call our office for further instructions as soon as you begin to have difficulty with bowel movements.  You may want to buy a disposable Sitz bath prior to surgery to aid in pain and cleanliness after surgery. The information on how to do use a disposable sitz bath or using a bath tub are below.  You will be out of work 2-3 days depending on how your healing goes. If you have FMLA/Disability paperwork that needs filled out you may drop this off at the office or fax it to [phone].   Surgery After Care Refer to this sheet in the next few weeks. These instructions provide you with information about caring for yourself after your procedure. Your health care provider may also give you more specific instructions. Your treatment has been planned according to current medical practices, but problems sometimes occur. Call your health care provider if you have any problems or questions after your procedure. What can I expect after the procedure? After the procedure, it is common to have: Rectal pain. Pain when you are having a bowel movement. Slight rectal  bleeding.  Follow these instructions at home: Medicines Take over-the-counter and prescription medicines only as told by your health care provider. Do not drive or operate heavy machinery while taking prescription pain medicine. Use a stool softener or a bulk laxative as told by your health care provider. Activity Rest at home. Return to your normal activities as told by your health care provider. Do not lift anything that is heavier than 10 lb (4.5 kg). Do not sit for long periods of time. Take a walk every day or as told by your health care provider. Do not strain to have a bowel movement. Do not spend a long time sitting on the toilet. Eating and drinking Eat foods that contain fiber, such as whole grains, beans, nuts, fruits, and vegetables. Drink enough fluid to keep your urine clear or pale yellow. General instructions Sit in a warm bath 2-3 times per day to relieve soreness or itching. Keep all follow-up visits as told by your health care provider. This is important. Contact a health care provider if: Your pain medicine is not helping. You have a fever or chills. You become constipated. You have trouble passing urine. Get help right away if: You have very bad rectal pain. You have heavy bleeding from your rectum. This information is not intended to replace advice given to you by your health care provider. Make sure you discuss any questions you have with your health care provider. Document Released: 03/14/2003 Document Revised: 05/30/2015 Document Reviewed: 03/19/2014 Elsevier Interactive  Patient Education  2018 [employer]   Disposable Sitz Bath A disposable sitz bath is a plastic basin that fits over the toilet. A bag is hung above the toilet, and the bag is connected to a tube that opens into the basin. The bag is filled with warm water that flows into the basin through the tube. A sitz bath can be used to help relieve symptoms, clean, and promote healing in the genital  and anal areas, as well as in the lower abdomen and buttocks. What are the risks? Sitz baths are generally very safe. It is possible for the skin between the genitals and the anus (perineum) to become infected, but this is rare. You can avoid this by cleaning your sitz bath supplies thoroughly. How to use a disposable sitz bath Close the clamp on the tube. Make sure the clamp is closed tightly to prevent leakage. Fill the sitz bath basin and the plastic bag with warm water. The water should be warm enough to be comfortable, but not hot. Raise the toilet seat and place the filled basin on the toilet. Make sure the overflow opening is facing toward the back of the toilet. If you prefer, you may place the empty basin on the toilet first, and then use the plastic bag to fill the basin with warm water. Hang the filled plastic bag overhead on a hook or towel rack close to the toilet. The bag should be higher than the toilet so that the water will flow down through the tube. Attach the tube to the opening on the basin. Make sure that the tube is attached to the basin tightly to prevent leakage. Sit on the basin and release the clamp. This will allow warm water to flow into the basin and flush the area around your genitals and anus. Remain sitting on the basin for about 15-20 minutes, or as long as told by your health care provider. Stand up and gently pat your skin dry. If directed, apply clean bandages (dressings) to the affected area as told by your health care provider. Carefully remove the basin from the toilet seat and tip the basin into the toilet to empty any remaining water. Empty any remaining water from the plastic bag into the toilet. Then, flush the toilet. Wash the basin with warm water and soap. Let the basin air dry in the sink. You should also let the plastic bag and the tubing air dry. Store the basin, tubing, and plastic bag in a clean, dry area. Wash your hands with soap and water. If  soap and water are not available, use hand sanitizer. Contact a health care provider if: You have symptoms that get worse instead of better. You develop new skin irritation, redness, or swelling around your genitals or anus. This information is not intended to replace advice given to you by your health care provider. Make sure you discuss any questions you have with your health care provider. Document Released: 06/23/2011 Document Revised: 05/30/2015 Document Reviewed: 11/11/2014 Elsevier Interactive Patient Education  2018 [employer].   How to Take a [employer] A sitz bath is a warm water bath that is taken while you are sitting down. The water should only come up to your hips and should cover your buttocks. Your health care provider may recommend a sitz bath to help you: Clean the lower part of your body, including your genital area. With itching. With pain. With sore muscles or muscles that tighten or spasm.  How to  take a sitz bath Take 3-4 sitz baths per day or as told by your health care provider. Partially fill a bathtub with warm water. You will only need the water to be deep enough to cover your hips and buttocks when you are sitting in it. If your health care provider told you to put medicine in the water, follow the directions exactly. Sit in the water and open the tub drain a little. Turn on the warm water again to keep the tub at the correct level. Keep the water running constantly. Soak in the water for 15-20 minutes or as told by your health care provider. After the sitz bath, pat the affected area dry first. Do not rub it. Be careful when you stand up after the sitz bath because you may feel dizzy.  Contact a health care provider if: Your symptoms get worse. Do not continue with sitz baths if your symptoms get worse. You have new symptoms. Do not continue with sitz baths until you talk with your health care provider. This information is not intended to replace advice  given to you by your health care provider. Make sure you discuss any questions you have with your health care provider. Document Released: 09/14/2003 Document Revised: 05/22/2015 Document Reviewed: 12/20/2013 Elsevier Interactive Patient Education  [employer].

## 2023-03-09 ENCOUNTER — Telehealth: Payer: Self-pay | Admitting: Surgery

## 2023-03-09 NOTE — Telephone Encounter (Signed)
 Left message for patient to call, please inform her of the following regarding scheduled surgery with Dr. Everlene Farrier.   Pre-Admission date/time, and Surgery date at [facility].  Surgery Date: 04/01/23 Preadmission Testing Date: 03/24/23 (phone 1p-4p)  Also patient will need to call at [phone], between 1-3:00pm the day before surgery, to find out what time to arrive for surgery.

## 2023-03-10 ENCOUNTER — Encounter: Payer: Self-pay | Admitting: Surgery

## 2023-03-10 NOTE — Telephone Encounter (Signed)
 Patient calls back, she is informed of all dates regarding surgery.

## 2023-03-10 NOTE — Progress Notes (Signed)
 Patient ID: Carolyn Clements, female   DOB: [birth year], 53 y.o.   MRN: [id number]  HPI Carolyn Clements is a 53 y.o. female seen in consultation at the request of Dr Servando Snare.  She was recently diagnosed with triple negative breast cancer lumpectomy.  The symptoms do not about 15 months ago by Dr. Dell Ponto. Prior to this she did have a colonoscopy that I have personally reviewed showing a rectal polyp.  This was done 06/2011.  She postponed the excision of the polyp due to also having concomitant breast cancer.  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  SHe is able to perform more than 4 METS without shortness of breath or chest pain.  HPI  Past Medical History:  Diagnosis Date   Anxiety    Bipolar 1 disorder (HCC)    Enchondroma, multiple    GERD (gastroesophageal reflux disease)    IUD    [facility]  Dr Shawnie Pons   Migraine headache    sinus related   Motion sickness    cars - long trips   MVP (mitral valve prolapse)    Saw cardiology for syncope 7/16.   PONV (postoperative nausea and vomiting)    Pyloric stenosis infant   Inguinal Hernia repair   Screening for cervical cancer April 2012   normal PAP    Wears contact lenses     Past Surgical History:  Procedure Laterality Date   ARTHROSCOPIC REPAIR ACL     torn/repaired ACL, followed by patellar fractuer, [employer] school [employer]   BREAST BIOPSY Left 07/25/2021   [place] Core BX 12:00 Ribbon clip - path pending   CESAREAN SECTION     2003 and 2004   COLONOSCOPY WITH PROPOFOL N/A 06/27/2021   Procedure: COLONOSCOPY WITH PROPOFOL;  Surgeon: Midge Minium, MD;  Location: [facility];  Service: Endoscopy;  Laterality: N/A;  Latex   HERNIA REPAIR  01/05/1974   IUD REMOVAL N/A 05/17/2013   Procedure: INTRAUTERINE DEVICE (IUD) REMOVAL;  Surgeon: Reva Bores, MD;  Location: [facility];  Service: Gynecology;  Laterality: N/A;  Replacement of IUD   PYLOROPLASTY  1974   TOE SURGERY Left 01/05/2002   Enchondroma removal - great toe   TONSILLECTOMY Bilateral 05/16/2015   Procedure: TONSILLECTOMY;  Surgeon: Vernie Murders, MD;  Location: [facility];  Service: ENT;  Laterality: Bilateral;  Latex sensitivity    Family History  Problem Relation Age of Onset   COPD Mother    Diabetes Mother    Hypertension Mother    Cancer Father        adenocarcinoma, nonsmoker   Cancer Maternal Grandfather        lung   Breast cancer Paternal Grandmother    Cancer Paternal Grandfather        lung    Social History Social History   Tobacco Use   Smoking status: Never    Passive exposure: Never   Smokeless tobacco: Never  Vaping Use   Vaping status: Never Used  Substance Use Topics   Alcohol use: Yes    Comment: 4-5 drinks on weekends   Drug use: No    Allergies  Allergen Reactions   Codeine Sulfate Itching and Nausea And Vomiting    Says all narcotics cause itching and she tries to avoid   Sulfa Antibiotics Hives  Hives and fever at age of 83, has not had since then   Tape Itching    whelts   Latex Rash    Not severe, but avoids gloves  condoms    Current Outpatient Medications  Medication Sig Dispense Refill   citalopram (CELEXA) 20 MG tablet Take 20 mg by mouth every morning.     gabapentin (NEURONTIN) 300 MG capsule Take 1 capsule by mouth at bedtime.     lamoTRIgine (LAMICTAL) 200 MG tablet TK 1 T PO D  0   levonorgestrel (MIRENA) 20 MCG/24HR IUD 1 Intra Uterine Device (1 each total) by Intrauterine route once for 1 dose. 1 each 0   MAGNESIUM GLUCONATE PO Take by mouth.     meloxicam (MOBIC) 15 MG tablet Take 15 mg by mouth daily.     omeprazole (PRILOSEC) 20 MG capsule TAKE ONE CAPSULE BY MOUTH TWICE A DAY 60 capsule 5   Probiotic Product (PROBIOTIC FORMULA PO) Take 1 capsule by mouth 2 (two) times daily.      traZODone (DESYREL) 100 MG tablet  Take 2 tablets by mouth daily.     triamcinolone (NASACORT ALLERGY 24HR) 55 MCG/ACT AERO nasal inhaler Place 2 sprays into the nose daily.     No current facility-administered medications for this visit.     Review of Systems Full ROS  was asked and was negative except for the information on the HPI  Physical Exam Blood pressure 114/74, pulse 74, temperature 98 F (36.7 C), height 5\' 2"  (1.575 m), weight 155 lb (70.3 kg), SpO2 98%. CONSTITUTIONAL: NAD. EYES: Pupils are equal, round, and reactive to light, Sclera are non-icteric. EARS, NOSE, MOUTH AND THROAT: The oropharynx is clear. The oral mucosa is pink and moist. Hearing is intact to voice. LYMPH NODES:  Lymph nodes in the neck are normal. RESPIRATORY:  Lungs are clear. There is normal respiratory effort, with equal breath sounds bilaterally, and without pathologic use of accessory muscles. CARDIOVASCULAR: Heart is regular without murmurs, gallops, or rubs. GI: The abdomen is  soft, nontender, and nondistended. There are no palpable masses. There is no hepatosplenomegaly. There are normal bowel sounds in all quadrants. Rectal: no evidence of masses or fissures, sphincter tone was normal Anoscopy:.  Right lateral position there is a polypoid lesion coming this does not seem to be concerning for malignancy MUSCULOSKELETAL: Normal muscle strength and tone. No cyanosis or edema.   SKIN: Turgor is good and there are no pathologic skin lesions or ulcers. NEUROLOGIC: Motor and sensation is grossly normal. Cranial nerves are grossly intact. PSYCH:  Oriented to person, place and time. Affect is normal.  Data Reviewed  I have personally reviewed the patient's imaging, laboratory findings and medical records.    Assessment/Plan 53 year old with rectal polyp.  Discussed with patient in detail about different options of observation versus exam and transrectal removal.  Patient wishes to move forward with removal.  I do think in the OR.   Procedure discussed with her in detail.  Risks, benefits, possible complications including but not limited to bleeding, infection, pain.  Proceed. A copy of the report was sent to the referring provider Please note that I spent 40 care placing orders a copy of this report was sent to the referring  Sterling Big, MD FACS General Surgeon 03/10/2023, 12:43 PM

## 2023-03-10 NOTE — H&P (View-Only) (Signed)
 Patient ID: Carolyn Clements, female   DOB: [birth year], 53 y.o.   MRN: [id number]  HPI Carolyn Clements is a 53 y.o. female seen in consultation at the request of Dr Servando Snare.  She was recently diagnosed with triple negative breast cancer lumpectomy.  The symptoms do not about 15 months ago by Dr. Dell Ponto. Prior to this she did have a colonoscopy that I have personally reviewed showing a rectal polyp.  This was done 06/2011.  She postponed the excision of the polyp due to also having concomitant breast cancer.  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  SHe is able to perform more than 4 METS without shortness of breath or chest pain.  HPI  Past Medical History:  Diagnosis Date   Anxiety    Bipolar 1 disorder (HCC)    Enchondroma, multiple    GERD (gastroesophageal reflux disease)    IUD    [facility]  Dr Shawnie Pons   Migraine headache    sinus related   Motion sickness    cars - long trips   MVP (mitral valve prolapse)    Saw cardiology for syncope 7/16.   PONV (postoperative nausea and vomiting)    Pyloric stenosis infant   Inguinal Hernia repair   Screening for cervical cancer April 2012   normal PAP    Wears contact lenses     Past Surgical History:  Procedure Laterality Date   ARTHROSCOPIC REPAIR ACL     torn/repaired ACL, followed by patellar fractuer, high school [employer]   BREAST BIOPSY Left 07/25/2021   [place] Core BX 12:00 Ribbon clip - path pending   CESAREAN SECTION     2003 and 2004   COLONOSCOPY WITH PROPOFOL N/A 06/27/2021   Procedure: COLONOSCOPY WITH PROPOFOL;  Surgeon: Midge Minium, MD;  Location: [facility];  Service: Endoscopy;  Laterality: N/A;  Latex   HERNIA REPAIR  01/05/1974   IUD REMOVAL N/A 05/17/2013   Procedure: INTRAUTERINE DEVICE (IUD) REMOVAL;  Surgeon: Reva Bores, MD;  Location: [facility];  Service: Gynecology;  Laterality: N/A;  Replacement of IUD   PYLOROPLASTY  1974   TOE SURGERY Left 01/05/2002   Enchondroma removal - great toe   TONSILLECTOMY Bilateral 05/16/2015   Procedure: TONSILLECTOMY;  Surgeon: Vernie Murders, MD;  Location: [facility];  Service: ENT;  Laterality: Bilateral;  Latex sensitivity    Family History  Problem Relation Age of Onset   COPD Mother    Diabetes Mother    Hypertension Mother    Cancer Father        adenocarcinoma, nonsmoker   Cancer Maternal Grandfather        lung   Breast cancer Paternal Grandmother    Cancer Paternal Grandfather        lung    Social History Social History   Tobacco Use   Smoking status: Never    Passive exposure: Never   Smokeless tobacco: Never  Vaping Use   Vaping status: Never Used  Substance Use Topics   Alcohol use: Yes    Comment: 4-5 drinks on weekends   Drug use: No    Allergies  Allergen Reactions   Codeine Sulfate Itching and Nausea And Vomiting    Says all narcotics cause itching and she tries to avoid   Sulfa Antibiotics Hives  Hives and fever at age of 83, has not had since then   Tape Itching    whelts   Latex Rash    Not severe, but avoids gloves  condoms    Current Outpatient Medications  Medication Sig Dispense Refill   citalopram (CELEXA) 20 MG tablet Take 20 mg by mouth every morning.     gabapentin (NEURONTIN) 300 MG capsule Take 1 capsule by mouth at bedtime.     lamoTRIgine (LAMICTAL) 200 MG tablet TK 1 T PO D  0   levonorgestrel (MIRENA) 20 MCG/24HR IUD 1 Intra Uterine Device (1 each total) by Intrauterine route once for 1 dose. 1 each 0   MAGNESIUM GLUCONATE PO Take by mouth.     meloxicam (MOBIC) 15 MG tablet Take 15 mg by mouth daily.     omeprazole (PRILOSEC) 20 MG capsule TAKE ONE CAPSULE BY MOUTH TWICE A DAY 60 capsule 5   Probiotic Product (PROBIOTIC FORMULA PO) Take 1 capsule by mouth 2 (two) times daily.      traZODone (DESYREL) 100 MG tablet  Take 2 tablets by mouth daily.     triamcinolone (NASACORT ALLERGY 24HR) 55 MCG/ACT AERO nasal inhaler Place 2 sprays into the nose daily.     No current facility-administered medications for this visit.     Review of Systems Full ROS  was asked and was negative except for the information on the HPI  Physical Exam Blood pressure 114/74, pulse 74, temperature 98 F (36.7 C), height 5\' 2"  (1.575 m), weight 155 lb (70.3 kg), SpO2 98%. CONSTITUTIONAL: NAD. EYES: Pupils are equal, round, and reactive to light, Sclera are non-icteric. EARS, NOSE, MOUTH AND THROAT: The oropharynx is clear. The oral mucosa is pink and moist. Hearing is intact to voice. LYMPH NODES:  Lymph nodes in the neck are normal. RESPIRATORY:  Lungs are clear. There is normal respiratory effort, with equal breath sounds bilaterally, and without pathologic use of accessory muscles. CARDIOVASCULAR: Heart is regular without murmurs, gallops, or rubs. GI: The abdomen is  soft, nontender, and nondistended. There are no palpable masses. There is no hepatosplenomegaly. There are normal bowel sounds in all quadrants. Rectal: no evidence of masses or fissures, sphincter tone was normal Anoscopy:.  Right lateral position there is a polypoid lesion coming this does not seem to be concerning for malignancy MUSCULOSKELETAL: Normal muscle strength and tone. No cyanosis or edema.   SKIN: Turgor is good and there are no pathologic skin lesions or ulcers. NEUROLOGIC: Motor and sensation is grossly normal. Cranial nerves are grossly intact. PSYCH:  Oriented to person, place and time. Affect is normal.  Data Reviewed  I have personally reviewed the patient's imaging, laboratory findings and medical records.    Assessment/Plan 53 year old with rectal polyp.  Discussed with patient in detail about different options of observation versus exam and transrectal removal.  Patient wishes to move forward with removal.  I do think in the OR.   Procedure discussed with her in detail.  Risks, benefits, possible complications including but not limited to bleeding, infection, pain.  Proceed. A copy of the report was sent to the referring provider Please note that I spent 40 care placing orders a copy of this report was sent to the referring  Sterling Big, MD FACS General Surgeon 03/10/2023, 12:43 PM

## 2023-03-24 ENCOUNTER — Inpatient Hospital Stay: Admission: RE | Admit: 2023-03-24 | Source: Ambulatory Visit

## 2023-03-26 ENCOUNTER — Other Ambulatory Visit: Payer: Self-pay

## 2023-03-26 ENCOUNTER — Encounter
Admission: RE | Admit: 2023-03-26 | Discharge: 2023-03-26 | Disposition: A | Discharge status: Home / Self Care | Source: Ambulatory Visit | Attending: Surgery | Admitting: Surgery

## 2023-03-26 VITALS — Ht 62.0 in | Wt 155.0 lb

## 2023-03-26 DIAGNOSIS — Z01818 Encounter for other preprocedural examination: Secondary | ICD-10-CM

## 2023-03-26 DIAGNOSIS — Z01812 Encounter for preprocedural laboratory examination: Secondary | ICD-10-CM

## 2023-03-26 DIAGNOSIS — D509 Iron deficiency anemia, unspecified: Secondary | ICD-10-CM

## 2023-03-26 HISTORY — DX: Cardiac murmur, unspecified: R01.1

## 2023-03-26 HISTORY — DX: Personal history of antineoplastic chemotherapy: Z92.21

## 2023-03-26 HISTORY — DX: Rectal polyp: K62.1

## 2023-03-26 HISTORY — DX: Unspecified dyspareunia: N94.10

## 2023-03-26 HISTORY — DX: Lymphedema, not elsewhere classified: I89.0

## 2023-03-26 HISTORY — DX: Iron deficiency anemia, unspecified: D50.9

## 2023-03-26 HISTORY — DX: Excessive and frequent menstruation with irregular cycle: N92.1

## 2023-03-26 HISTORY — DX: Unilateral inguinal hernia, without obstruction or gangrene, not specified as recurrent: K40.90

## 2023-03-26 HISTORY — DX: Personal history of irradiation: Z92.3

## 2023-03-26 NOTE — Patient Instructions (Signed)
 Your procedure is scheduled on:04-01-23 Thursday Report to the Registration Desk on the 1st floor of the Medical Mall.Then proceed to the 2nd floor Surgery Desk To find out your arrival time, please call [phone] between 1PM - 3PM on:03-31-23 Wednesday If your arrival time is 6:00 am, do not arrive before that time as the Medical Mall entrance doors do not open until 6:00 am.  REMEMBER: Instructions that are not followed completely may result in serious medical risk, up to and including death; or upon the discretion of your surgeon and anesthesiologist your surgery may need to be rescheduled.  Do not eat food OR drink any liquids after midnight the night before surgery.  No gum chewing or hard candies.  One week prior to surgery:Stop NOW (03-26-23) Stop Anti-inflammatories (NSAIDS) such as meloxicam (MOBIC), Advil, Aleve, Ibuprofen, Motrin, Naproxen, Naprosyn and Aspirin based products such as Excedrin, Goody's Powder, BC Powder. Stop ANY OVER THE COUNTER supplements until after surgery (B Complex, Vitamin D3, Magnesium, Probiotic)  You may however, continue to take Tylenol if needed for pain up until the day of surgery.  Continue taking all of your other prescription medications up until the day of surgery.  ON THE DAY OF SURGERY ONLY TAKE THESE MEDICATIONS WITH SIPS OF WATER: -buPROPion (WELLBUTRIN)  -lamoTRIgine (LAMICTAL)  -omeprazole (PRILOSEC)   Do Fleet Enema at home at bedtime the night prior to your surgery (Fleet enema and enema instructions enclosed)  No Alcohol for 24 hours before or after surgery.  No Smoking including e-cigarettes for 24 hours before surgery.  No chewable tobacco products for at least 6 hours before surgery.  No nicotine patches on the day of surgery.  Do not use any "recreational" drugs for at least a week (preferably 2 weeks) before your surgery.  Please be advised that the combination of cocaine and anesthesia may have negative outcomes, up to and  including death. If you test positive for cocaine, your surgery will be cancelled.  On the morning of surgery brush your teeth with toothpaste and water, you may rinse your mouth with mouthwash if you wish. Do not swallow any toothpaste or mouthwash.  Do not wear jewelry, make-up, hairpins, clips or nail polish.  For welded (permanent) jewelry: bracelets, anklets, waist bands, etc.  Please have this removed prior to surgery.  If it is not removed, there is a chance that hospital personnel will need to cut it off on the day of surgery.  Do not wear lotions, powders, or perfumes.   Do not shave body hair from the neck down 48 hours before surgery.  Contact lenses, hearing aids and dentures may not be worn into surgery.  Do not bring valuables to the hospital. [facility] is not responsible for any missing/lost belongings or valuables.   Notify your doctor if there is any change in your medical condition (cold, fever, infection).  Wear comfortable clothing (specific to your surgery type) to the hospital.  After surgery, you can help prevent lung complications by doing breathing exercises.  Take deep breaths and cough every 1-2 hours. Your doctor may order a device called an Incentive Spirometer to help you take deep breaths. When coughing or sneezing, hold a pillow firmly against your incision with both hands. This is called "splinting." Doing this helps protect your incision. It also decreases belly discomfort.  If you are being admitted to the hospital overnight, leave your suitcase in the car. After surgery it may be brought to your room.  In case  of increased patient census, it may be necessary for you, the patient, to continue your postoperative care in the Same Day Surgery department.  If you are being discharged the day of surgery, you will not be allowed to drive home. You will need a responsible individual to drive you home and stay with you for 24 hours after surgery.   If you  are taking public transportation, you will need to have a responsible individual with you.  Please call the Pre-admissions Testing Dept. at [phone] if you have any questions about these instructions.  Surgery Visitation Policy:  Patients having surgery or a procedure may have two visitors.  Children under the age of 52 must have an adult with them who is not the patient.  Temporary Visitor Restrictions Due to increasing cases of flu, RSV and COVID-19: Children ages 34 and under will not be able to visit patients in [facility] hospitals under most circumstances.

## 2023-03-26 NOTE — Progress Notes (Signed)
 Called pt to do her anesthesia interview for upcoming surgery with Dr Hilton Cork has a h/o of IDA and needs a cbc. 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 CBC will have to be done [facility]

## 2023-03-31 MED ORDER — CHLORHEXIDINE GLUCONATE CLOTH 2 % EX PADS: 6.0000 | MEDICATED_PAD | Freq: Once | CUTANEOUS | Status: DC

## 2023-03-31 MED ORDER — GABAPENTIN 300 MG PO CAPS
300.0000 mg | ORAL_CAPSULE | ORAL | Status: AC
  Administered 2023-04-01: 300 mg via ORAL

## 2023-03-31 MED ORDER — ACETAMINOPHEN 500 MG PO TABS
1000.0000 mg | ORAL_TABLET | ORAL | Status: AC
  Administered 2023-04-01: 1000 mg via ORAL

## 2023-03-31 MED ORDER — CELECOXIB 200 MG PO CAPS
200.0000 mg | ORAL_CAPSULE | ORAL | Status: AC
  Administered 2023-04-01: 200 mg via ORAL

## 2023-03-31 MED ORDER — CHLORHEXIDINE GLUCONATE 0.12 % MT SOLN
15.0000 mL | Freq: Once | OROMUCOSAL | Status: AC
  Administered 2023-04-01: 15 mL via OROMUCOSAL

## 2023-03-31 MED ORDER — ORAL CARE MOUTH RINSE: 15.0000 mL | Freq: Once | OROMUCOSAL | Status: AC

## 2023-03-31 MED ORDER — LACTATED RINGERS IV SOLN: INTRAVENOUS | Status: DC

## 2023-03-31 MED ORDER — SODIUM CHLORIDE 0.9 % IV SOLN
2.0000 g | INTRAVENOUS | Status: AC
  Administered 2023-04-01: 2 g via INTRAVENOUS

## 2023-04-01 ENCOUNTER — Encounter
Admission: RE | Disposition: A | Discharge status: Home / Self Care | Payer: Self-pay | Source: Home / Self Care | Attending: Surgery

## 2023-04-01 ENCOUNTER — Other Ambulatory Visit: Payer: Self-pay

## 2023-04-01 ENCOUNTER — Ambulatory Visit
Admission: RE | Admit: 2023-04-01 | Discharge: 2023-04-01 | Disposition: A | Discharge status: Home / Self Care | Attending: Surgery | Admitting: Surgery

## 2023-04-01 ENCOUNTER — Encounter: Payer: Self-pay | Admitting: Surgery

## 2023-04-01 ENCOUNTER — Ambulatory Visit: Admitting: Anesthesiology

## 2023-04-01 DIAGNOSIS — D509 Iron deficiency anemia, unspecified: Secondary | ICD-10-CM

## 2023-04-01 DIAGNOSIS — Z01818 Encounter for other preprocedural examination: Secondary | ICD-10-CM

## 2023-04-01 DIAGNOSIS — K649 Unspecified hemorrhoids: Secondary | ICD-10-CM | POA: Insufficient documentation

## 2023-04-01 DIAGNOSIS — K621 Rectal polyp: Secondary | ICD-10-CM

## 2023-04-01 DIAGNOSIS — Z01812 Encounter for preprocedural laboratory examination: Secondary | ICD-10-CM

## 2023-04-01 HISTORY — PX: TUMOR EXCISION: SHX421

## 2023-04-01 LAB — CBC
HCT: 36.7 % (ref 36.0–46.0)
Hemoglobin: 12 g/dL (ref 12.0–15.0)
MCH: 30.7 pg (ref 26.0–34.0)
MCHC: 32.7 g/dL (ref 30.0–36.0)
MCV: 93.9 fL (ref 80.0–100.0)
Platelets: 169 10*3/uL (ref 150–400)
RBC: 3.91 MIL/uL (ref 3.87–5.11)
RDW: 12.5 % (ref 11.5–15.5)
WBC: 4.2 10*3/uL (ref 4.0–10.5)
nRBC: 0 % (ref 0.0–0.2)

## 2023-04-01 LAB — POCT PREGNANCY, URINE: Preg Test, Ur: NEGATIVE

## 2023-04-01 SURGERY — EXAM UNDER ANESTHESIA
Anesthesia: General

## 2023-04-01 MED ORDER — FENTANYL CITRATE (PF) 100 MCG/2ML IJ SOLN
INTRAMUSCULAR | Status: DC | PRN
  Administered 2023-04-01 (×4): 25 ug via INTRAVENOUS

## 2023-04-01 MED ORDER — CHLORHEXIDINE GLUCONATE 0.12 % MT SOLN
OROMUCOSAL | Status: AC
  Filled 2023-04-01: qty 15

## 2023-04-01 MED ORDER — PROPOFOL 10 MG/ML IV BOLUS
INTRAVENOUS | Status: AC
  Filled 2023-04-01: qty 40

## 2023-04-01 MED ORDER — DEXAMETHASONE SODIUM PHOSPHATE 10 MG/ML IJ SOLN
INTRAMUSCULAR | Status: AC
  Filled 2023-04-01: qty 1

## 2023-04-01 MED ORDER — BUPIVACAINE LIPOSOME 1.3 % IJ SUSP
INTRAMUSCULAR | Status: DC | PRN
  Administered 2023-04-01: 20 mL

## 2023-04-01 MED ORDER — PROPOFOL 10 MG/ML IV BOLUS
INTRAVENOUS | Status: DC | PRN
  Administered 2023-04-01: 50 mg via INTRAVENOUS

## 2023-04-01 MED ORDER — ONDANSETRON HCL 4 MG/2ML IJ SOLN
INTRAMUSCULAR | Status: AC
  Filled 2023-04-01: qty 2

## 2023-04-01 MED ORDER — FENTANYL CITRATE (PF) 100 MCG/2ML IJ SOLN
INTRAMUSCULAR | Status: AC
  Filled 2023-04-01: qty 2

## 2023-04-01 MED ORDER — LIDOCAINE HCL (PF) 2 % IJ SOLN
INTRAMUSCULAR | Status: AC
  Filled 2023-04-01: qty 5

## 2023-04-01 MED ORDER — CELECOXIB 200 MG PO CAPS
ORAL_CAPSULE | ORAL | Status: AC
  Filled 2023-04-01: qty 1

## 2023-04-01 MED ORDER — CEFOTETAN DISODIUM 2 G IJ SOLR
INTRAMUSCULAR | Status: AC
  Filled 2023-04-01: qty 2

## 2023-04-01 MED ORDER — ONDANSETRON HCL 4 MG/2ML IJ SOLN
INTRAMUSCULAR | Status: DC | PRN
  Administered 2023-04-01: 4 mg via INTRAVENOUS

## 2023-04-01 MED ORDER — MIDAZOLAM HCL 2 MG/2ML IJ SOLN
INTRAMUSCULAR | Status: AC
Start: 2023-04-01 — End: ?
  Filled 2023-04-01: qty 2

## 2023-04-01 MED ORDER — ACETAMINOPHEN 500 MG PO TABS
ORAL_TABLET | ORAL | Status: AC
  Filled 2023-04-01: qty 2

## 2023-04-01 MED ORDER — HYDROCODONE-ACETAMINOPHEN 5-325 MG PO TABS: 1.0000 | ORAL_TABLET | ORAL | 0 refills | Status: DC | PRN

## 2023-04-01 MED ORDER — PHENYLEPHRINE 80 MCG/ML (10ML) SYRINGE FOR IV PUSH (FOR BLOOD PRESSURE SUPPORT)
PREFILLED_SYRINGE | INTRAVENOUS | Status: DC | PRN
  Administered 2023-04-01 (×3): 40 ug via INTRAVENOUS

## 2023-04-01 MED ORDER — PROPOFOL 500 MG/50ML IV EMUL
INTRAVENOUS | Status: DC | PRN
  Administered 2023-04-01: 150 ug/kg/min via INTRAVENOUS

## 2023-04-01 MED ORDER — OXYCODONE HCL 5 MG PO TABS: 5.0000 mg | ORAL_TABLET | Freq: Once | ORAL | Status: DC | PRN

## 2023-04-01 MED ORDER — MIDAZOLAM HCL 2 MG/2ML IJ SOLN
INTRAMUSCULAR | Status: DC | PRN
Start: 2023-04-01 — End: 2023-04-01
  Administered 2023-04-01: 2 mg via INTRAVENOUS

## 2023-04-01 MED ORDER — PROPOFOL 10 MG/ML IV BOLUS
INTRAVENOUS | Status: AC
  Filled 2023-04-01: qty 20

## 2023-04-01 MED ORDER — BUPIVACAINE LIPOSOME 1.3 % IJ SUSP
INTRAMUSCULAR | Status: AC
  Filled 2023-04-01: qty 20

## 2023-04-01 MED ORDER — FENTANYL CITRATE (PF) 100 MCG/2ML IJ SOLN: 25.0000 ug | INTRAMUSCULAR | Status: DC | PRN

## 2023-04-01 MED ORDER — PHENYLEPHRINE 80 MCG/ML (10ML) SYRINGE FOR IV PUSH (FOR BLOOD PRESSURE SUPPORT)
PREFILLED_SYRINGE | INTRAVENOUS | Status: AC
  Filled 2023-04-01: qty 10

## 2023-04-01 MED ORDER — DEXAMETHASONE SODIUM PHOSPHATE 10 MG/ML IJ SOLN
INTRAMUSCULAR | Status: DC | PRN
  Administered 2023-04-01: 5 mg via INTRAVENOUS

## 2023-04-01 MED ORDER — LIDOCAINE HCL (CARDIAC) PF 100 MG/5ML IV SOSY
PREFILLED_SYRINGE | INTRAVENOUS | Status: DC | PRN
Start: 2023-04-01 — End: 2023-04-01
  Administered 2023-04-01: 50 mg via INTRAVENOUS

## 2023-04-01 MED ORDER — GABAPENTIN 300 MG PO CAPS
ORAL_CAPSULE | ORAL | Status: AC
  Filled 2023-04-01: qty 1

## 2023-04-01 MED ORDER — OXYCODONE HCL 5 MG/5ML PO SOLN: 5.0000 mg | Freq: Once | ORAL | Status: DC | PRN

## 2023-04-01 SURGICAL SUPPLY — 30 items
BLADE CLIPPER SURG (BLADE) ×2 IMPLANT
BLADE SURG 15 STRL LF DISP TIS (BLADE) ×2 IMPLANT
BRIEF MESH DISP 2XL (UNDERPADS AND DIAPERS) ×4 IMPLANT
DRAPE LAPAROTOMY 77X122 PED (DRAPES) ×2 IMPLANT
DRAPE LEGGINS SURG 28X43 STRL (DRAPES) ×2 IMPLANT
DRAPE SHEET LG 3/4 BI-LAMINATE (DRAPES) ×2 IMPLANT
DRAPE UNDER BUTTOCK W/FLU (DRAPES) ×2 IMPLANT
DRSG GAUZE FLUFF 36X18 (GAUZE/BANDAGES/DRESSINGS) ×2 IMPLANT
ELECT CAUTERY BLADE 6.4 (BLADE) ×2 IMPLANT
ELECT REM PT RETURN 9FT ADLT (ELECTROSURGICAL) ×1 IMPLANT
ELECTRODE REM PT RTRN 9FT ADLT (ELECTROSURGICAL) ×2 IMPLANT
GAUZE 4X4 16PLY ~~LOC~~+RFID DBL (SPONGE) ×2 IMPLANT
GLOVE BIO SURGEON STRL SZ7 (GLOVE) ×2 IMPLANT
GOWN STRL REUS W/ TWL LRG LVL3 (GOWN DISPOSABLE) ×4 IMPLANT
MANIFOLD NEPTUNE II (INSTRUMENTS) ×2 IMPLANT
NDL HYPO 22X1.5 SAFETY MO (MISCELLANEOUS) ×2 IMPLANT
NEEDLE HYPO 22X1.5 SAFETY MO (MISCELLANEOUS) ×1 IMPLANT
NS IRRIG 1000ML POUR BTL (IV SOLUTION) ×2 IMPLANT
NS IRRIG 500ML POUR BTL (IV SOLUTION) ×2 IMPLANT
PACK BASIN MINOR ARMC (MISCELLANEOUS) ×2 IMPLANT
PAD ABD DERMACEA PRESS 5X9 (GAUZE/BANDAGES/DRESSINGS) ×2 IMPLANT
PAD PREP OB/GYN DISP 24X41 (PERSONAL CARE ITEMS) ×2 IMPLANT
SHEARS HARMONIC 9CM CVD (BLADE) ×2 IMPLANT
SOL PREP PVP 2OZ (MISCELLANEOUS) ×1 IMPLANT
SOLUTION PREP PVP 2OZ (MISCELLANEOUS) ×2 IMPLANT
SPONGE T-LAP 18X18 ~~LOC~~+RFID (SPONGE) ×2 IMPLANT
SURGILUBE 2OZ TUBE FLIPTOP (MISCELLANEOUS) ×2 IMPLANT
SYR 20ML LL LF (SYRINGE) ×2 IMPLANT
TRAP FLUID SMOKE EVACUATOR (MISCELLANEOUS) ×2 IMPLANT
WATER STERILE IRR 500ML POUR (IV SOLUTION) ×2 IMPLANT

## 2023-04-01 NOTE — Anesthesia Preprocedure Evaluation (Signed)
 Anesthesia Evaluation  Patient identified by MRN, date of birth, ID band Patient awake    Reviewed: Allergy & Precautions, NPO status , Patient's Chart, lab work & pertinent test results  History of Anesthesia Complications (+) PONV and history of anesthetic complications  Airway Mallampati: II  TM Distance: >3 FB Neck ROM: full    Dental no notable dental hx.    Pulmonary neg pulmonary ROS   Pulmonary exam normal        Cardiovascular Normal cardiovascular exam+ Valvular Problems/Murmurs MVP      Neuro/Psych  Headaches PSYCHIATRIC DISORDERS Anxiety  Bipolar Disorder      GI/Hepatic Neg liver ROS,GERD  Medicated,,  Endo/Other  negative endocrine ROS    Renal/GU negative Renal ROS  negative genitourinary   Musculoskeletal   Abdominal   Peds  Hematology  (+) Blood dyscrasia, anemia   Anesthesia Other Findings Past Medical History: No date: Anxiety No date: Bipolar 1 disorder (HCC) No date: Dyspareunia in female No date: Enchondroma, multiple No date: GERD (gastroesophageal reflux disease) No date: Heart murmur No date: History of chemotherapy 2010: History of methicillin resistant staphylococcus aureus (MRSA) No date: History of radiation therapy No date: IDA (iron deficiency anemia) No date: IUD     Comment:  [facility]  Dr Shawnie Pons No date: Lymphedema     Comment:  to left breast area-not left arm No date: Menorrhagia with irregular cycle No date: Migraine headache No date: Motion sickness     Comment:  cars - long trips No date: MVP (mitral valve prolapse)     Comment:  Saw cardiology for syncope 7/16-since then does not               follow with cardiology No date: PONV (postoperative nausea and vomiting) infant: Pyloric stenosis No date: Rectal polyp 04/2010: Screening for cervical cancer     Comment:  normal PAP  2023: Triple negative breast cancer (HCC) No date: Unilateral inguinal  hernia in newborn No date: Wears contact lenses  Past Surgical History: No date: ARTHROSCOPIC REPAIR ACL     Comment:  torn/repaired ACL, followed by patellar fractuer, 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 07/25/2021: BREAST BIOPSY; Left     Comment:  [place] Core BX 12:00 Ribbon clip - path pending No date: CESAREAN SECTION     Comment:  2003 and 2004 06/27/2021: COLONOSCOPY WITH PROPOFOL; N/A     Comment:  Procedure: COLONOSCOPY WITH PROPOFOL;  Surgeon: Midge Minium, MD;  Location: [facility];  Service:               Endoscopy;  Laterality: N/A;  Latex 01/05/1974: HERNIA REPAIR 05/17/2013: IUD REMOVAL; N/A     Comment:  Procedure: INTRAUTERINE DEVICE (IUD) REMOVAL;  Surgeon:               Reva Bores, MD;  Location: [facility];  Service:               Gynecology;  Laterality: N/A;  Replacement of IUD No date: LYMPH NODE BIOPSY 2023: MASTECTOMY PARTIAL / LUMPECTOMY; Left No date: NASAL SINUS SURGERY     Comment:  x2 1974: PYLOROPLASTY 01/05/2002: TOE SURGERY; Left     Comment:  Enchondroma removal - great toe 05/16/2015: TONSILLECTOMY; Bilateral     Comment:  Procedure: TONSILLECTOMY;  Surgeon: Vernie Murders,  MD;                Location: [facility];  Service: ENT;                Laterality: Bilateral;  Latex sensitivity     Reproductive/Obstetrics negative OB ROS                             Anesthesia Physical Anesthesia Plan  ASA: 2  Anesthesia Plan: General   Post-op Pain Management: Minimal or no pain anticipated, Toradol IV (intra-op)* and Ofirmev IV (intra-op)*   Induction: Intravenous  PONV Risk Score and Plan: 4 or greater and Propofol infusion and TIVA  Airway Management Planned: Natural Airway and Nasal Cannula  Additional Equipment:   Intra-op Plan:   Post-operative Plan:   Informed Consent: I have reviewed the patients History and Physical, chart, labs and discussed the procedure including the  risks, benefits and alternatives for the proposed anesthesia with the patient or authorized representative who has indicated his/her understanding and acceptance.     Dental Advisory Given  Plan Discussed with: Anesthesiologist, CRNA and Surgeon  Anesthesia Plan Comments: (Patient consented for risks of anesthesia including but not limited to:  - adverse reactions to medications - risk of airway placement if required - damage to eyes, teeth, lips or other oral mucosa - nerve damage due to positioning  - sore throat or hoarseness - Damage to heart, brain, nerves, lungs, other parts of body or loss of life  Patient voiced understanding and assent.)       Anesthesia Quick Evaluation

## 2023-04-01 NOTE — Op Note (Signed)
  Transanal excision rectal polyp  PRE-OPERATIVE DIAGNOSIS:  Rectal polyp  POST-OPERATIVE DIAGNOSIS:  Same  PROCEDURE:   Transanal excision rectal polyp 1 cm   SURGEON:  Surgeon(s) and Role:    * Brantlee Hinde F, MD - Primary  ASSISTANTS: none  ANESTHESIA: General MAC  FINDINGS: Right anterolateral rectal polyp distal rectum 2 cm from anal verge   DICTATION:  Patient was explained about the  procedure in detail, risks benefits possible complications and a consent was obtained. The patient taken to the operating room and placed in the modified lithotomy position w all pressure points padded.  Exam revealed  right anterolateral polypoid lesion. THis was elevated with an allis and using harmonic scalpel it was excised. Excellent hemostasis observed. Liposomal Marcaine  was injected around the wound site. Needle and laparotomy counts were correct and there were no immediate complications  Leafy Ro, MD

## 2023-04-01 NOTE — Transfer of Care (Signed)
 Immediate Anesthesia Transfer of Care Note  Patient: Carolyn Clements  Procedure(s) Performed: EXAM UNDER ANESTHESIA TUMOR EXCISION RECTAL, polyp  Patient Location: PACU  Anesthesia Type:General  Level of Consciousness: drowsy  Airway & Oxygen Therapy: Patient Spontanous Breathing and Patient connected to face mask oxygen  Post-op Assessment: Report given to RN and Post -op Vital signs reviewed and stable  Post vital signs: Reviewed and stable  Last Vitals:  Vitals Value Taken Time  BP 85/55 04/01/23 0949  Temp 36.6 C 04/01/23 0949  Pulse 62 04/01/23 0949  Resp 10 04/01/23 0945  SpO2 95 % 04/01/23 0949    Last Pain:  Vitals:   04/01/23 0949  TempSrc: Temporal  PainSc:          Complications: No notable events documented.

## 2023-04-01 NOTE — Interval H&P Note (Signed)
 History and Physical Interval Note:  04/01/2023 8:23 AM  Carolyn Clements  has presented today for surgery, with the diagnosis of rectal polyp.  The various methods of treatment have been discussed with the patient and family. After consideration of risks, benefits and other options for treatment, the patient has consented to  Procedure(s): EXAM UNDER ANESTHESIA (N/A) TUMOR EXCISION RECTAL, polyp (N/A) as a surgical intervention.  The patient's history has been reviewed, patient examined, no change in status, stable for surgery.  I have reviewed the patient's chart and labs.  Questions were answered to the patient's satisfaction.     Paeton Studer F Flecia Shutter

## 2023-04-01 NOTE — Discharge Instructions (Addendum)
 Surgical Procedures for rectal polyps, Care After After surgery for polyps it is common to have: Pain in your rectum for 2-4 weeks after the procedure, and may take 1-2 months to fully recover. Pain when you are pooping. A small amount of bleeding from your rectum. This is more likely to happen the first time you poop after surgery. Follow these instructions at home: Medicines Take over-the-counter and prescription medicines only as told by your health care provider. If you were prescribed antibiotics, use them as told by your provider. Do not stop using the antibiotic even if you start to feel better. Ask your provider if the medicine prescribed to you requires you to avoid driving or using machinery. Use a stool softener or a medicine that helps you poop (laxative) as told by your provider. Eating and drinking Follow instructions from your provider about what you may eat and drink after the surgery. You may need to take these actions to prevent or treat constipation: Drink enough fluid to keep your pee (urine) pale yellow. Take over-the-counter or prescription medicines. Eat foods that are high in fiber, such as beans, whole grains, and fresh fruits and vegetables. Limit foods that are high in fat and processed sugars, such as fried or sweet foods. Managing pain and swelling  Take warm sitz baths for 15-20 minutes, 2-3 times a day to ease soreness and itching. This will also help keep the rectal area clean. If told, put ice on the affected area. It may help to use ice packs between sitz baths. Put ice in a plastic bag. Place a towel between your skin and the bag. Leave the ice on for 20 minutes, 2-3 times a day. If your skin turns bright red, remove the ice right away to prevent skin damage. The risk of damage is higher if you cannot feel pain, heat, or cold. Activity Rest as told by your provider. Do not sit for a long time without moving. Get up to take short walks every 1-2 hours.  This will improve blood flow and breathing. Ask for help if you feel weak or unsteady. You may have to avoid lifting. Ask your provider how much you can safely lift. Return to your normal activities as told by your provider. Ask your provider what activities are safe for you. General instructions Do not strain to poop. Do not spend a long time sitting on the toilet. If you were given a sedative during the surgery, it can affect you for several hours. Do not drive or operate machinery until your provider says that it is safe. Your provider may give you more instructions. Make sure you know what you can and cannot do. Contact a health care provider if: Your pain medicine is not helping. You have a fever or chills. You have drainage that smells bad. You have a lot of swelling. You become constipated. You have trouble peeing (urinating). You have very bad pain in your rectum. Get help right away if: You are bleeding a lot from your rectum. This information is not intended to replace advice given to you by your health care provider. Make sure you discuss any questions you have with your health care provider. Document Revised: 08/15/2021 Document Reviewed: 08/15/2021 Elsevier Patient Education  2024 [employer].

## 2023-04-01 NOTE — Progress Notes (Signed)
 Per Dr. Everlene Farrier patient is able to resume her hot tub and pelvic floor therapy tomorrow or when she feels ready. Notified patient and husband.

## 2023-04-02 ENCOUNTER — Encounter: Payer: Self-pay | Admitting: Surgery

## 2023-04-02 LAB — SURGICAL PATHOLOGY

## 2023-04-02 NOTE — Anesthesia Postprocedure Evaluation (Signed)
 Anesthesia Post Note  Patient: Carolyn Clements  Procedure(s) Performed: EXAM UNDER ANESTHESIA TUMOR EXCISION RECTAL, polyp  Patient location during evaluation: PACU Anesthesia Type: General Level of consciousness: awake and alert Pain management: pain level controlled Vital Signs Assessment: post-procedure vital signs reviewed and stable Respiratory status: spontaneous breathing, nonlabored ventilation, respiratory function stable and patient connected to nasal cannula oxygen Cardiovascular status: blood pressure returned to baseline and stable Postop Assessment: no apparent nausea or vomiting Anesthetic complications: no   No notable events documented.   Last Vitals:  Vitals:   04/01/23 1015 04/01/23 1031  BP: 102/69 124/74  Pulse: 61 67  Resp: 11 15  Temp: 36.5 C 36.6 C  SpO2: 99% 100%    Last Pain:  Vitals:   04/01/23 1031  TempSrc: Tympanic  PainSc: 0-No pain                 Louie Boston

## 2023-04-12 ENCOUNTER — Ambulatory Visit (INDEPENDENT_AMBULATORY_CARE_PROVIDER_SITE_OTHER): Admitting: Surgery

## 2023-04-12 ENCOUNTER — Encounter: Payer: Self-pay | Admitting: Surgery

## 2023-04-12 VITALS — BP 125/80 | HR 67 | Temp 98.7°F | Ht 62.0 in | Wt 151.0 lb

## 2023-04-12 DIAGNOSIS — Z09 Encounter for follow-up examination after completed treatment for conditions other than malignant neoplasm: Secondary | ICD-10-CM

## 2023-04-12 DIAGNOSIS — K621 Rectal polyp: Secondary | ICD-10-CM

## 2023-04-12 NOTE — Progress Notes (Signed)
 S/p transrectal excision Path c/w benign dz Doing well Some pain and itching as expected, some spotting  PE NAD, in good spirits Abd: soft, nt Rectal: excision site healing well, no infection or active bleeding  A/p Doing well No complications RTC prn

## 2023-04-12 NOTE — Patient Instructions (Signed)

## 2023-04-26 NOTE — H&P (Signed)
 Preoperative History and Physical  Chief Complaint: Carolyn Clements is a 53 y.o. G3P2010 here for surgical management of menorrhagia with irregular cycle and dysmenorrhea.   No significant preoperative concerns.  History of Present Illness: 53 y.o. G47P2010 female who has a recent diagnosis of triple negative breast cancer. She has a history of menorrhagia with irregular cycles and dysmenorrhea who has a levonorgestrel  IUS. It has been recommended to her to have this removed due to her cancer.  She has had bad periods prior to her IUD and would have to miss school/work due to cramps and/or nausea.   Proposed surgery: Hysteroscopy, dilation and curettage, endometrial ablation.  Past Medical History:  Diagnosis Date   Arthritis    Osteo   Bipolar 1 disorder (CMS/HHS-HCC)    Breast cancer (CMS/HHS-HCC) 07/2021   Depression    Enchondroma, multiple (HHS-HCC)    as per onc problem list   GAD (generalized anxiety disorder)    GERD (gastroesophageal reflux disease)    Heart murmur    History of abnormal cervical Pap smear    ASCUS/HPV neg in 2018, ASCUS/HPV neg in 2020, LGSIL/HPV neg in 2021; PAP in 2022 normal   History of chemotherapy    History of dyspareunia in female 03/26/2019   History of migraine headaches    sinus related   History of radiation therapy    History of syncope 2016   Iron deficiency anemia    Mitral valve prolapse 07/06/2014   nonrheumatic mitral valve prolapse   Motion sickness    long car rides   PONV (postoperative nausea and vomiting)    Pyloric stenosis (HHS-HCC)    as an infant, had inguinal hernia repair   Past Surgical History:  Procedure Laterality Date   PYLOROPLASTY  1974   INGUINAL HERNIA REPAIR  1976   for pyloric stenosis as an infant   toe surgery Left 2004   left great toe enchondroma removal   removal of trapped IUD/replacement  05/17/2013   Dr. Tiffany Foerster, [facility] [place], [place]   TONSILLECTOMY  05/16/2015   Dr. Bobie Burton, [facility]   MASTECTOMY PARTIAL Left 09/04/2021   Procedure: Left MASTECTOMY, PARTIAL LUMPECTOMY,SEED;  Surgeon: Lisa Rideau, MD;  [facility];  Service: General Surgery;  Laterality: Left;   BIOPSY/EXCISION LYMPH NODE AXILLARY Left 09/04/2021   Procedure: BIOPSY OR EXCISION OF LYMPH NODE(S); OPEN, DEEP AXILLARY NODE(S);  Surgeon: Lisa Rideau, MD;  Location: [facility] OR;  Service: General Surgery;  Laterality: Left;   OPEN EXCISION BREAST LESION Left 09/25/2021   Procedure: Left EXCISION OF CYST, FIBROADENOMA, OR OTHER BENIGN OR MALIGNANT TUMOR, ABERRANT BREAST TISSUE, DUCT LESION, NIPPLE OR AREOLAR LESION;  Surgeon: Lisa Rideau, MD;  Location: [facility] OR;  Service: General Surgery;  Laterality: Left;   athroscopic repair ACL     torn/repaired ACL, followed by patellar fractuer   BREAST BIOPSY     BREAST LUMPECTOMY     CESAREAN SECTION  2003, 2005   LYMPH NODE BIOPSY     STOMACH SURGERY  1974   Pyloric stenosis   OB History  Gravida Para Term Preterm AB Living  3 2 2   1     SAB IAB Ectopic Molar Multiple Live Births  1              # Outcome Date GA Lbr Len/2nd Weight Sex Type Anes PTL Lv  3 Term 2005 [redacted]w[redacted]d   F CS-LTranv  Birth Comments: ERCS  2 Term 2003 [redacted]w[redacted]d   F CS-LTranv        Birth Comments: Emergent CS  1 SAB 2002 [redacted]w[redacted]d            Birth Comments: D&C  Patient denies any other pertinent gynecologic issues.   Current Outpatient Medications on File Prior to Visit  Medication Sig Dispense Refill   buPROPion  (WELLBUTRIN ) 100 MG tablet 100 mg     fexofenadine (ALLEGRA) 180 MG tablet Take 180 mg by mouth as needed     gabapentin  (NEURONTIN ) 300 MG capsule Take 300 mg by mouth at bedtime     lamoTRIgine (LAMICTAL) 100 MG tablet Take 1 tablet by mouth at bedtime     lamoTRIgine (LAMICTAL) 200 MG tablet Take 200 mg by mouth 2 (two) times daily     levonorgestreL  (MIRENA  52 MG) IUD Insert into the uterus     omeprazole   (PRILOSEC) 20 MG DR capsule Take 20 mg by mouth once daily     traZODone (DESYREL) 100 MG tablet Take 200 mg by mouth at bedtime     No current facility-administered medications on file prior to visit.   Allergies  Allergen Reactions   Adhesive Tape-Silicones Itching and Rash    itching  whelts    itching   Codeine Itching, Nausea And Vomiting and Other (See Comments)    Chills, rash, fever, itching  Chills, rash, fever, itching    Says all narcotics cause itching and she tries to avoid  codeine   Silicones Itching and Rash    silicones   Sulfa (Sulfonamide Antibiotics) Hives and Other (See Comments)    FEVER, CHILLS  Hives and fever at age of 42, has not had since then    FEVER, CHILLS   Latex Rash   Adhesive Rash    Social History:   reports that she has never smoked. She has never used smokeless tobacco. She reports current alcohol use of about 3.0 - 4.0 standard drinks of alcohol per week. She reports current drug use. Frequency: 1.00 time per week. Drug: Other-see comments.  Family History  Problem Relation Name Age of Onset   Diabetes type II Mother Holly    High blood pressure (Hypertension) Mother Buddie Carina    COPD Mother Buddie Carina    Bladder Cancer Mother Buddie Carina    Hyperlipidemia (Elevated cholesterol) Father Ben    Prostate cancer Father Lovena Rubinstein    Lung cancer Father Ben        adenocarcinoma in non-smoker   Pancreatic cancer Maternal Grandfather Primus Brookes    Breast cancer Paternal Deann Exon    Lung cancer Paternal Grandfather     No Known Problems Brother     Anesthesia problems Neg Hx     Ovarian cancer Neg Hx      Review of Systems: Noncontributory  PHYSICAL EXAM: Blood pressure 102/67, pulse 73, height 157.5 cm (5\' 2" ), weight 69.8 kg (153 lb 12.8 oz). CONSTITUTIONAL: Well-developed, well-nourished female in no acute distress.  HENT:  Normocephalic, atraumatic, External right and left ear normal. Oropharynx is clear and moist EYES: Conjunctivae and EOM  are normal. Pupils are equal, round, and reactive to light. No scleral icterus.  NECK: Normal range of motion, supple, no masses SKIN: Skin is warm and dry. No rash noted. Not diaphoretic. No erythema. No pallor. NEUROLGIC: Alert and oriented to person, place, and time. Normal reflexes, muscle tone coordination. No cranial nerve deficit noted. PSYCHIATRIC: Normal mood and affect. Normal behavior. Normal judgment  and thought content. CARDIOVASCULAR: Normal heart rate noted, regular rhythm RESPIRATORY: Effort and breath sounds normal, no problems with respiration noted ABDOMEN: Soft, nontender, nondistended. PELVIC: Deferred MUSCULOSKELETAL: Normal range of motion. No edema and no tenderness. 2+ distal pulses.   Assessment: 1. Menorrhagia with irregular cycle   2. Dysmenorrhea     Plan: Patient will undergo surgical management with the above noted surgery.   The risks of surgery were discussed in detail with the patient including but not limited to: bleeding which may require transfusion or reoperation; infection which may require antibiotics; injury to surrounding organs which may involve bowel, bladder, ureters ; need for additional procedures including laparoscopy or laparotomy; thromboembolic phenomenon, surgical site problems and other postoperative/anesthesia complications. Likelihood of success in alleviating the patient's condition was discussed. Routine postoperative instructions will be reviewed with the patient and her family in detail after surgery.  The patient concurred with the proposed plan, giving informed written consent for the surgery.   Preoperative prophylactic antibiotics, as indicated, and SCDs ordered on call to the OR.     Attestation Statement:   I personally performed the service. (TP)  Rhys Anchondo Teddy Fear, MD  [facility] OB[facility]GYN [facility] 04/26/2023 3:22 PM

## 2023-04-30 ENCOUNTER — Other Ambulatory Visit: Payer: Self-pay

## 2023-04-30 ENCOUNTER — Encounter
Admission: RE | Admit: 2023-04-30 | Discharge: 2023-04-30 | Disposition: A | Discharge status: Home / Self Care | Source: Ambulatory Visit | Attending: Obstetrics and Gynecology | Admitting: Obstetrics and Gynecology

## 2023-04-30 VITALS — Ht 62.0 in | Wt 153.0 lb

## 2023-04-30 DIAGNOSIS — Z01818 Encounter for other preprocedural examination: Secondary | ICD-10-CM

## 2023-04-30 NOTE — Patient Instructions (Addendum)
 Your procedure is scheduled on:05-07-23 Friday Report to the Registration Desk on the 1st floor of the Medical Mall.Then proceed to the 2nd floor Surgery Desk To find out your arrival time, please call [phone] between 1PM - 3PM on:05-06-23 Thursday If your arrival time is 6:00 am, do not arrive before that time as the Medical Mall entrance doors do not open until 6:00 am.  REMEMBER: Instructions that are not followed completely may result in serious medical risk, up to and including death; or upon the discretion of your surgeon and anesthesiologist your surgery may need to be rescheduled.  Do not eat food after midnight the night before surgery.  No gum chewing or hard candies.  You may however, drink CLEAR liquids up to 2 hours before you are scheduled to arrive for your surgery. Do not drink anything within 2 hours of your scheduled arrival time.  Clear liquids include: - water   - apple juice without pulp - gatorade (not RED colors) - black coffee or tea (Do NOT add milk or creamers to the coffee or tea) Do NOT drink anything that is not on this list.  In addition, your doctor has ordered for you to drink the provided:  Ensure Pre-Surgery Clear Carbohydrate Drink  Drinking this carbohydrate drink up to two hours before surgery helps to reduce insulin resistance and improve patient outcomes. Please complete drinking 2 hours before scheduled arrival time.  One week prior to surgery:Stop NOW (04-30-23) Stop Anti-inflammatories (NSAIDS) such as Advil, Aleve, Ibuprofen, Motrin, Naproxen, Naprosyn and Aspirin based products such as Excedrin, Goody's Powder, BC Powder. Stop ANY OVER THE COUNTER supplements until after surgery (B Complex, Vitamin D3, Magnesium, Probiotic)  You may however, continue to take Tylenol  if needed for pain up until the day of surgery.  Continue taking all of your other prescription medications up until the day of surgery.  ON THE DAY OF SURGERY ONLY TAKE THESE  MEDICATIONS WITH SIPS OF WATER : -buPROPion  (WELLBUTRIN )  -lamoTRIgine (LAMICTAL)  -omeprazole  (PRILOSEC)   No Alcohol for 24 hours before or after surgery.  No Smoking including e-cigarettes for 24 hours before surgery.  No chewable tobacco products for at least 6 hours before surgery.  No nicotine patches on the day of surgery.  Do not use any "recreational" drugs for at least a week (preferably 2 weeks) before your surgery.  Please be advised that the combination of cocaine and anesthesia may have negative outcomes, up to and including death. If you test positive for cocaine, your surgery will be cancelled.  On the morning of surgery brush your teeth with toothpaste and water , you may rinse your mouth with mouthwash if you wish. Do not swallow any toothpaste or mouthwash.  Do not wear jewelry, make-up, hairpins, clips or nail polish.  For welded (permanent) jewelry: bracelets, anklets, waist bands, etc.  Please have this removed prior to surgery.  If it is not removed, there is a chance that hospital personnel will need to cut it off on the day of surgery.  Do not wear lotions, powders, or perfumes.   Do not shave body hair from the neck down 48 hours before surgery.  Contact lenses, hearing aids and dentures may not be worn into surgery.  Do not bring valuables to the hospital. [facility] is not responsible for any missing/lost belongings or valuables.   Notify your doctor if there is any change in your medical condition (cold, fever, infection).  Wear comfortable clothing (specific to your surgery type) to  the hospital.  After surgery, you can help prevent lung complications by doing breathing exercises.  Take deep breaths and cough every 1-2 hours. Your doctor may order a device called an Incentive Spirometer to help you take deep breaths. When coughing or sneezing, hold a pillow firmly against your incision with both hands. This is called "splinting." Doing this helps  protect your incision. It also decreases belly discomfort.  If you are being admitted to the hospital overnight, leave your suitcase in the car. After surgery it may be brought to your room.  In case of increased patient census, it may be necessary for you, the patient, to continue your postoperative care in the Same Day Surgery department.  If you are being discharged the day of surgery, you will not be allowed to drive home. You will need a responsible individual to drive you home and stay with you for 24 hours after surgery.   If you are taking public transportation, you will need to have a responsible individual with you.  Please call the Pre-admissions Testing Dept. at [phone] if you have any questions about these instructions.  Surgery Visitation Policy:  Patients having surgery or a procedure may have two visitors.  Children under the age of 58 must have an adult with them who is not the patient.

## 2023-05-07 ENCOUNTER — Encounter: Payer: Self-pay | Admitting: Obstetrics and Gynecology

## 2023-05-07 ENCOUNTER — Ambulatory Visit
Admission: RE | Admit: 2023-05-07 | Discharge: 2023-05-07 | Disposition: A | Discharge status: Home / Self Care | Attending: Obstetrics and Gynecology | Admitting: Obstetrics and Gynecology

## 2023-05-07 ENCOUNTER — Encounter
Admission: RE | Disposition: A | Discharge status: Home / Self Care | Payer: Self-pay | Source: Home / Self Care | Attending: Obstetrics and Gynecology

## 2023-05-07 ENCOUNTER — Other Ambulatory Visit: Payer: Self-pay

## 2023-05-07 ENCOUNTER — Ambulatory Visit: Admitting: Certified Registered"

## 2023-05-07 DIAGNOSIS — R011 Cardiac murmur, unspecified: Secondary | ICD-10-CM | POA: Diagnosis not present

## 2023-05-07 DIAGNOSIS — F319 Bipolar disorder, unspecified: Secondary | ICD-10-CM | POA: Diagnosis not present

## 2023-05-07 DIAGNOSIS — N946 Dysmenorrhea, unspecified: Secondary | ICD-10-CM | POA: Diagnosis present

## 2023-05-07 DIAGNOSIS — Z8249 Family history of ischemic heart disease and other diseases of the circulatory system: Secondary | ICD-10-CM | POA: Diagnosis not present

## 2023-05-07 DIAGNOSIS — N921 Excessive and frequent menstruation with irregular cycle: Secondary | ICD-10-CM | POA: Diagnosis present

## 2023-05-07 DIAGNOSIS — I341 Nonrheumatic mitral (valve) prolapse: Secondary | ICD-10-CM | POA: Diagnosis not present

## 2023-05-07 DIAGNOSIS — Z853 Personal history of malignant neoplasm of breast: Secondary | ICD-10-CM | POA: Insufficient documentation

## 2023-05-07 DIAGNOSIS — Z30432 Encounter for removal of intrauterine contraceptive device: Secondary | ICD-10-CM | POA: Diagnosis not present

## 2023-05-07 DIAGNOSIS — Z302 Encounter for sterilization: Secondary | ICD-10-CM

## 2023-05-07 DIAGNOSIS — Z79899 Other long term (current) drug therapy: Secondary | ICD-10-CM | POA: Diagnosis not present

## 2023-05-07 DIAGNOSIS — K219 Gastro-esophageal reflux disease without esophagitis: Secondary | ICD-10-CM | POA: Insufficient documentation

## 2023-05-07 DIAGNOSIS — Z01818 Encounter for other preprocedural examination: Secondary | ICD-10-CM

## 2023-05-07 HISTORY — PX: HYSTEROSCOPY WITH D & C: SHX1775

## 2023-05-07 HISTORY — PX: XI ROBOTIC ASSISTED SALPINGECTOMY: SHX6824

## 2023-05-07 HISTORY — PX: IUD REMOVAL: SHX5392

## 2023-05-07 LAB — POCT PREGNANCY, URINE: Preg Test, Ur: NEGATIVE

## 2023-05-07 SURGERY — SALPINGECTOMY, ROBOT-ASSISTED
Anesthesia: General | Site: Uterus

## 2023-05-07 MED ORDER — CHLORHEXIDINE GLUCONATE 0.12 % MT SOLN
OROMUCOSAL | Status: AC
  Filled 2023-05-07: qty 15

## 2023-05-07 MED ORDER — IBUPROFEN 600 MG PO TABS: 600.0000 mg | ORAL_TABLET | Freq: Four times a day (QID) | ORAL | 0 refills | Status: DC

## 2023-05-07 MED ORDER — SUGAMMADEX SODIUM 200 MG/2ML IV SOLN
INTRAVENOUS | Status: DC | PRN
  Administered 2023-05-07: 200 mg via INTRAVENOUS

## 2023-05-07 MED ORDER — LACTATED RINGERS IV SOLN: INTRAVENOUS | Status: AC

## 2023-05-07 MED ORDER — BUPIVACAINE HCL 0.5 % IJ SOLN
INTRAMUSCULAR | Status: DC | PRN
  Administered 2023-05-07: 9 mL

## 2023-05-07 MED ORDER — PROPOFOL 500 MG/50ML IV EMUL
INTRAVENOUS | Status: DC | PRN
  Administered 2023-05-07: 150 ug/kg/min via INTRAVENOUS

## 2023-05-07 MED ORDER — PROPOFOL 1000 MG/100ML IV EMUL
INTRAVENOUS | Status: AC
  Filled 2023-05-07: qty 100

## 2023-05-07 MED ORDER — KETAMINE HCL 50 MG/5ML IJ SOSY
PREFILLED_SYRINGE | INTRAMUSCULAR | Status: DC | PRN
  Administered 2023-05-07: 30 mg via INTRAVENOUS

## 2023-05-07 MED ORDER — ONDANSETRON HCL 4 MG/2ML IJ SOLN
INTRAMUSCULAR | Status: DC | PRN
  Administered 2023-05-07: 4 mg via INTRAVENOUS

## 2023-05-07 MED ORDER — FENTANYL CITRATE (PF) 100 MCG/2ML IJ SOLN
INTRAMUSCULAR | Status: DC | PRN
  Administered 2023-05-07 (×2): 50 ug via INTRAVENOUS

## 2023-05-07 MED ORDER — ACETAMINOPHEN 500 MG PO TABS
1000.0000 mg | ORAL_TABLET | Freq: Once | ORAL | Status: AC
Start: 2023-05-07 — End: 2023-05-07
  Administered 2023-05-07: 1000 mg via ORAL

## 2023-05-07 MED ORDER — GABAPENTIN 300 MG PO CAPS
ORAL_CAPSULE | ORAL | Status: AC
  Filled 2023-05-07: qty 1

## 2023-05-07 MED ORDER — OXYCODONE HCL 5 MG PO TABS
5.0000 mg | ORAL_TABLET | Freq: Once | ORAL | Status: AC | PRN
  Administered 2023-05-07: 5 mg via ORAL

## 2023-05-07 MED ORDER — 0.9 % SODIUM CHLORIDE (POUR BTL) OPTIME
TOPICAL | Status: DC | PRN
  Administered 2023-05-07: 500 mL

## 2023-05-07 MED ORDER — CHLORHEXIDINE GLUCONATE 0.12 % MT SOLN
15.0000 mL | Freq: Once | OROMUCOSAL | Status: AC
  Administered 2023-05-07: 15 mL via OROMUCOSAL

## 2023-05-07 MED ORDER — PROPOFOL 10 MG/ML IV BOLUS
INTRAVENOUS | Status: DC | PRN
  Administered 2023-05-07: 200 mg via INTRAVENOUS
  Administered 2023-05-07: 50 mg via INTRAVENOUS

## 2023-05-07 MED ORDER — MIDAZOLAM HCL 2 MG/2ML IJ SOLN
INTRAMUSCULAR | Status: AC
  Filled 2023-05-07: qty 2

## 2023-05-07 MED ORDER — CEFAZOLIN SODIUM-DEXTROSE 2-4 GM/100ML-% IV SOLN
INTRAVENOUS | Status: AC
  Filled 2023-05-07: qty 100

## 2023-05-07 MED ORDER — KETAMINE HCL 50 MG/5ML IJ SOSY
PREFILLED_SYRINGE | INTRAMUSCULAR | Status: AC
  Filled 2023-05-07: qty 5

## 2023-05-07 MED ORDER — SILVER NITRATE-POT NITRATE 75-25 % EX MISC
CUTANEOUS | Status: AC
  Filled 2023-05-07: qty 10

## 2023-05-07 MED ORDER — MIDAZOLAM HCL 5 MG/5ML IJ SOLN
INTRAMUSCULAR | Status: DC | PRN
  Administered 2023-05-07: 2 mg via INTRAVENOUS

## 2023-05-07 MED ORDER — DEXMEDETOMIDINE HCL IN NACL 80 MCG/20ML IV SOLN
INTRAVENOUS | Status: DC | PRN
  Administered 2023-05-07: 8 ug via INTRAVENOUS
  Administered 2023-05-07: 12 ug via INTRAVENOUS

## 2023-05-07 MED ORDER — LACTATED RINGERS IV SOLN: INTRAVENOUS | Status: DC

## 2023-05-07 MED ORDER — CEFAZOLIN SODIUM-DEXTROSE 1-4 GM/50ML-% IV SOLN
INTRAVENOUS | Status: DC | PRN
  Administered 2023-05-07: 2 g via INTRAVENOUS

## 2023-05-07 MED ORDER — LIDOCAINE HCL (CARDIAC) PF 100 MG/5ML IV SOSY
PREFILLED_SYRINGE | INTRAVENOUS | Status: DC | PRN
  Administered 2023-05-07: 100 mg via INTRAVENOUS

## 2023-05-07 MED ORDER — ORAL CARE MOUTH RINSE: 15.0000 mL | Freq: Once | OROMUCOSAL | Status: AC

## 2023-05-07 MED ORDER — DROPERIDOL 2.5 MG/ML IJ SOLN: 0.6250 mg | Freq: Once | INTRAMUSCULAR | Status: DC | PRN

## 2023-05-07 MED ORDER — OXYCODONE HCL 5 MG PO TABS
ORAL_TABLET | ORAL | Status: AC
  Filled 2023-05-07: qty 1

## 2023-05-07 MED ORDER — CELECOXIB 200 MG PO CAPS
200.0000 mg | ORAL_CAPSULE | Freq: Once | ORAL | Status: AC
Start: 2023-05-07 — End: 2023-05-07
  Administered 2023-05-07: 200 mg via ORAL

## 2023-05-07 MED ORDER — FENTANYL CITRATE (PF) 100 MCG/2ML IJ SOLN: 25.0000 ug | INTRAMUSCULAR | Status: DC | PRN

## 2023-05-07 MED ORDER — SILVER NITRATE-POT NITRATE 75-25 % EX MISC
CUTANEOUS | Status: DC | PRN
  Administered 2023-05-07: 4

## 2023-05-07 MED ORDER — BUPIVACAINE HCL (PF) 0.5 % IJ SOLN
INTRAMUSCULAR | Status: AC
  Filled 2023-05-07: qty 30

## 2023-05-07 MED ORDER — ACETAMINOPHEN 10 MG/ML IV SOLN: 1000.0000 mg | Freq: Once | INTRAVENOUS | Status: DC | PRN

## 2023-05-07 MED ORDER — DEXAMETHASONE SODIUM PHOSPHATE 10 MG/ML IJ SOLN
INTRAMUSCULAR | Status: DC | PRN
  Administered 2023-05-07: 10 mg via INTRAVENOUS

## 2023-05-07 MED ORDER — FENTANYL CITRATE (PF) 100 MCG/2ML IJ SOLN
INTRAMUSCULAR | Status: AC
  Filled 2023-05-07: qty 2

## 2023-05-07 MED ORDER — OXYCODONE HCL 5 MG/5ML PO SOLN: 5.0000 mg | Freq: Once | ORAL | Status: AC | PRN

## 2023-05-07 MED ORDER — ROCURONIUM BROMIDE 100 MG/10ML IV SOLN
INTRAVENOUS | Status: DC | PRN
  Administered 2023-05-07: 20 mg via INTRAVENOUS
  Administered 2023-05-07: 50 mg via INTRAVENOUS
  Administered 2023-05-07: 20 mg via INTRAVENOUS

## 2023-05-07 MED ORDER — ACETAMINOPHEN 500 MG PO TABS
ORAL_TABLET | ORAL | Status: AC
  Filled 2023-05-07: qty 2

## 2023-05-07 MED ORDER — GABAPENTIN 300 MG PO CAPS
300.0000 mg | ORAL_CAPSULE | Freq: Once | ORAL | Status: AC
  Administered 2023-05-07: 300 mg via ORAL

## 2023-05-07 MED ORDER — CELECOXIB 200 MG PO CAPS
ORAL_CAPSULE | ORAL | Status: AC
  Filled 2023-05-07: qty 1

## 2023-05-07 SURGICAL SUPPLY — 55 items
ANCHOR TIS RET SYS 235ML (MISCELLANEOUS) ×5 IMPLANT
BAG URINE DRAIN 2000ML AR STRL (UROLOGICAL SUPPLIES) ×5 IMPLANT
CATH URTH 16FR FL 2W BLN LF (CATHETERS) ×5 IMPLANT
COVER TIP SHEARS 8 DVNC (MISCELLANEOUS) ×5 IMPLANT
COVER WAND RF STERILE (DRAPES) ×5 IMPLANT
DERMABOND ADVANCED .7 DNX12 (GAUZE/BANDAGES/DRESSINGS) ×5 IMPLANT
DEVICE MYOSURE LITE (MISCELLANEOUS) IMPLANT
DEVICE MYOSURE REACH (MISCELLANEOUS) IMPLANT
DRAPE ARM DVNC X/XI (DISPOSABLE) ×15 IMPLANT
DRAPE COLUMN DVNC XI (DISPOSABLE) ×5 IMPLANT
DRAPE ROBOT W/ LEGGING 30X125 (DRAPES) ×5 IMPLANT
DRAPE SHEET LG 3/4 BI-LAMINATE (DRAPES) ×5 IMPLANT
DRAPE UNDER BUTTOCK W/FLU (DRAPES) ×5 IMPLANT
DRSG TELFA 3X8 NADH STRL (GAUZE/BANDAGES/DRESSINGS) IMPLANT
ELECTRODE REM PT RTRN 9FT ADLT (ELECTROSURGICAL) ×5 IMPLANT
FORCEPS BPLR 8 MD DVNC XI (FORCEP) ×5 IMPLANT
FORCEPS BPLR R/ABLATION 8 DVNC (INSTRUMENTS) ×5 IMPLANT
GAUZE 4X4 16PLY ~~LOC~~+RFID DBL (SPONGE) ×1 IMPLANT
GLOVE BIO SURGEON STRL SZ7 (GLOVE) ×10 IMPLANT
GLOVE BIOGEL PI IND STRL 7.5 (GLOVE) ×15 IMPLANT
GOWN STRL REUS W/ TWL LRG LVL3 (GOWN DISPOSABLE) ×15 IMPLANT
GRASPER SUT TROCAR 14GX15 (MISCELLANEOUS) IMPLANT
IRRIGATION STRYKERFLOW (MISCELLANEOUS) IMPLANT
IV NS 1000ML BAXH (IV SOLUTION) ×5 IMPLANT
KIT PINK PAD W/HEAD ARE REST (MISCELLANEOUS) ×4 IMPLANT
KIT PINK PAD W/HEAD ARM REST (MISCELLANEOUS) ×4 IMPLANT
KIT PROCEDURE FLUENT (KITS) ×5 IMPLANT
KIT TURNOVER CYSTO (KITS) ×5 IMPLANT
LABEL OR SOLS (LABEL) ×5 IMPLANT
MANIFOLD NEPTUNE II (INSTRUMENTS) ×5 IMPLANT
NS IRRIG 1000ML POUR BTL (IV SOLUTION) ×5 IMPLANT
OBTURATOR OPTICALSTD 8 DVNC (TROCAR) ×5 IMPLANT
PACK DNC HYST (MISCELLANEOUS) ×5 IMPLANT
PACK LAP CHOLECYSTECTOMY (MISCELLANEOUS) ×5 IMPLANT
PAD OB MATERNITY 11 LF (PERSONAL CARE ITEMS) ×5 IMPLANT
PAD PREP OB/GYN DISP 24X41 (PERSONAL CARE ITEMS) ×5 IMPLANT
SCISSORS MNPLR CVD DVNC XI (INSTRUMENTS) ×5 IMPLANT
SCRUB CHG 4% DYNA-HEX 4OZ (MISCELLANEOUS) ×5 IMPLANT
SEAL ROD LENS SCOPE MYOSURE (ABLATOR) ×5 IMPLANT
SEAL UNIV 5-12 XI (MISCELLANEOUS) ×15 IMPLANT
SEALER VESSEL EXT DVNC XI (MISCELLANEOUS) IMPLANT
SET CYSTO W/LG BORE CLAMP LF (SET/KITS/TRAYS/PACK) IMPLANT
SOL .9 NS 3000ML IRR UROMATIC (IV SOLUTION) ×5 IMPLANT
SOLUTION ELECTROSURG ANTI STCK (MISCELLANEOUS) ×5 IMPLANT
SPONGE T-LAP 18X18 ~~LOC~~+RFID (SPONGE) IMPLANT
SURGILUBE 2OZ TUBE FLIPTOP (MISCELLANEOUS) ×5 IMPLANT
SUT STRATA 2-0 23CM CT-2 (SUTURE) IMPLANT
SUT VICRYL 0 UR6 27IN ABS (SUTURE) IMPLANT
SUTURE MNCRL 4-0 27XMF (SUTURE) ×5 IMPLANT
SYR 10ML LL (SYRINGE) ×5 IMPLANT
TOWEL OR 17X26 4PK STRL BLUE (TOWEL DISPOSABLE) ×5 IMPLANT
TRAP FLUID SMOKE EVACUATOR (MISCELLANEOUS) ×5 IMPLANT
TUBING CONNECTING 10 (TUBING) ×5 IMPLANT
TUBING EVAC SMOKE HEATED PNEUM (TUBING) ×5 IMPLANT
WATER STERILE IRR 500ML POUR (IV SOLUTION) ×5 IMPLANT

## 2023-05-07 NOTE — Anesthesia Preprocedure Evaluation (Addendum)
 Anesthesia Evaluation  Patient identified by MRN, date of birth, ID band Patient awake    Reviewed: Allergy & Precautions, H&P , NPO status , Patient's Chart, lab work & pertinent test results  History of Anesthesia Complications (+) PONV and history of anesthetic complications  Airway Mallampati: II  TM Distance: >3 FB Neck ROM: full    Dental no notable dental hx.    Pulmonary neg pulmonary ROS   Pulmonary exam normal        Cardiovascular Normal cardiovascular exam+ Valvular Problems/Murmurs MVP      Neuro/Psych  PSYCHIATRIC DISORDERS   Bipolar Disorder   negative neurological ROS     GI/Hepatic Neg liver ROS,GERD  Controlled and Medicated,,  Endo/Other  negative endocrine ROS    Renal/GU      Musculoskeletal   Abdominal Normal abdominal exam  (+)   Peds  Hematology negative hematology ROS (+)   Anesthesia Other Findings Past Medical History: No date: Anxiety No date: Bipolar 1 disorder (HCC) No date: Dyspareunia in female No date: Enchondroma, multiple No date: GERD (gastroesophageal reflux disease) No date: Heart murmur No date: History of chemotherapy 2010: History of methicillin resistant staphylococcus aureus (MRSA) No date: History of radiation therapy No date: IDA (iron deficiency anemia) No date: IUD     Comment:  [facility]  Dr Adriana Hopping No date: Lymphedema     Comment:  to left breast area-not left arm No date: Menorrhagia with irregular cycle No date: Migraine headache No date: Motion sickness     Comment:  cars - long trips No date: MVP (mitral valve prolapse)     Comment:  Saw cardiology for syncope 7/16-since then does not               follow with cardiology No date: PONV (postoperative nausea and vomiting) infant: Pyloric stenosis No date: Rectal polyp 04/2010: Screening for cervical cancer     Comment:  normal PAP  2023: Triple negative breast cancer (HCC) No date:  Unilateral inguinal hernia in newborn No date: Wears contact lenses  Past Surgical History: No date: ARTHROSCOPIC REPAIR ACL     Comment:  torn/repaired ACL, followed by patellar fractuer, 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 07/25/2021: BREAST BIOPSY; Left     Comment:  Us  Core BX 12:00 Ribbon clip - path pending No date: CESAREAN SECTION     Comment:  2003 and 2004 06/27/2021: COLONOSCOPY WITH PROPOFOL ; N/A     Comment:  Procedure: COLONOSCOPY WITH PROPOFOL ;  Surgeon: Marnee Sink, MD;  Location: [facility];  Service:               Endoscopy;  Laterality: N/A;  Latex 01/05/1974: HERNIA REPAIR 05/17/2013: IUD REMOVAL; N/A     Comment:  Procedure: INTRAUTERINE DEVICE (IUD) REMOVAL;  Surgeon:               Granville Layer, MD;  Location: [facility];  Service:               Gynecology;  Laterality: N/A;  Replacement of IUD No date: LYMPH NODE BIOPSY 2023: MASTECTOMY PARTIAL / LUMPECTOMY; Left No date: NASAL SINUS SURGERY     Comment:  x2 1974: PYLOROPLASTY 01/05/2002: TOE SURGERY; Left     Comment:  Enchondroma removal - great toe 05/16/2015: TONSILLECTOMY; Bilateral  Comment:  Procedure: TONSILLECTOMY;  Surgeon: Mellody Sprout, MD;                Location: [facility];  Service: ENT;                Laterality: Bilateral;  Latex sensitivity 04/01/2023: TUMOR EXCISION; N/A     Comment:  Procedure: TUMOR EXCISION RECTAL, polyp;  Surgeon:               Alben Alma, MD;  Location: [facility] ORS;  Service:               General;  Laterality: N/A;     Reproductive/Obstetrics negative OB ROS                             Anesthesia Physical Anesthesia Plan  ASA: 2  Anesthesia Plan: General ETT   Post-op Pain Management: Tylenol  PO (pre-op)*, Celebrex  PO (pre-op)* and Gabapentin  PO (pre-op)*   Induction: Intravenous  PONV Risk Score and Plan: 2 and Ondansetron , Dexamethasone , Midazolam , Propofol  infusion and TIVA  Airway  Management Planned: Oral ETT  Additional Equipment:   Intra-op Plan:   Post-operative Plan: Extubation in OR  Informed Consent: I have reviewed the patients History and Physical, chart, labs and discussed the procedure including the risks, benefits and alternatives for the proposed anesthesia with the patient or authorized representative who has indicated his/her understanding and acceptance.     Dental Advisory Given  Plan Discussed with: CRNA and Surgeon  Anesthesia Plan Comments:         Anesthesia Quick Evaluation

## 2023-05-07 NOTE — Op Note (Signed)
 Operative Note    Name: Carolyn Clements  Date of Service: 05/07/2023  DOB: April 19, 1970  MRN: [id number]   Pre-Operative Diagnosis:  1) Menorrhagia with irregular cycle 2) Intrauterine device in place 3) Desires permanent sterilization  Post-Operative Diagnosis:  1) Menorrhagia with irregular cycle 2) Intrauterine device in place 3) Desires permanent sterilization  Procedures:  1) Robot assisted laparoscopic bilateral salpingectomy 2) Removal of intrauterine device  2) Hysteroscopy, dilation and curettage (aborted procedure)  Primary Surgeon: Jeani Mill, MD   EBL: 5 mL   IVF: 1,000 mL   Urine output: 150 mL  Fluid Deficit: 205 mL  Specimens: Left and right fallopian tube  Drains: none  Complications: None   Disposition: PACU   Condition: Stable   Findings:  1) Elongated ovaries with normal appearing bilateral fallopian tubes 2) Normal appearing uterus (from intra-abdominal view) with overall normal appearing ovaries, though somewhat elongated. 3) IUD strings visible and intact IUD after removal 4) Unable to cannulate cervix to confidently enter uterine cavity. Normal appearing cervical canal.  I did not visualize the internal uterine cavity.  Procedure Summary:  The patient was taken to the operating room where general anesthesia was administered and found to be adequate. She was placed in the dorsal supine lithotomy position in [place] stirrups and prepped and draped in usual sterile fashion. After a timeout was called an indwelling catheter was placed in her bladder.  A sponge on a stick was placed in her vagina for uterine manipulation.  Attention was turned to the abdomen where after injection of local anesthetic, an 8 mm infraumbilical incision was made with the scalpel. Entry into the abdomen was obtained via Optiview trocar technique (a blunt entry technique with camera visualization through the obturator upon entry). Verification of entry into the  abdomen was obtained using opening pressures. The abdomen was insufflated with CO2. The camera was introduced through the trocar with verification of atraumatic entry.  Right and left abdominal entry sites were created after injection of local anesthetic about 8 cm away from the umbilical port in accordance with the Intuitive manufacturer's recommendations.  The port sites were 8 mm.  The intuitive trochars were introduced under intra-abdominal camera visualization without difficulty.  The XI robot was docked on the patient's left.  Clearance was verified from the patient's legs.  Through the umbilical port the camera was placed.  Through the port attached to arm 4 the monopolar scissors were placed.  Through the port attached to arm 2 the forced bipolar forceps were was placed.    A survey of her anatomy was undertaken with the above-noted findings.  The left fallopian tube was identified and the scissors were utilized to transect the mesosalpinx.  Larger vessels were sealed using electrocautery then transected using the scissors.  Near the cornual region the bipolar forceps were used to cauterize the fallopian tube and the scissors were used to transect the fallopian tube.  The tube was then liberated from the adnexa.  Hemostasis was noted.  The same procedure was carried out on the right side.  The tubes were passed outside of the body directly through the trocar.  Hemostasis was again noted.  The intra-abdominal pressure was lowered to 5 mmHg and hemostasis was again noted.  The instruments were removed and the robot was undocked from the patient.  The robot was moved from the patient bedside.  The abdomen was emptied of CO2 with the aid of 5 deep breaths from anesthesia.  Trocars were removed.  The skin was closed using 4-0 Monocryl in a subcuticular fashion and reinforced with skin glue.  Attention was turned to the pelvis.  The sponge stick was removed from the vagina.  A sterile speculum was placed in  the vagina.  The anterior lip of the cervix was grasped with a single-tooth tenaculum.  The IUD strings were visualized.  They were grasped using a ring forceps.  With gentle traction the IUD was removed in its entirety.  An attempt to dilate the cervix was taken without difficulty.  The cervix was easily dilated to 7 mm without undue resistance.  The hysteroscope was introduced and the passageway appeared narrow and the texture of the surrounding tissue did not appear to be consistent with the uterine cavity.  No apparent perforation of the uterus was noted.  Also, the fluid management system did not appear to be losing excessive fluid.  An attempt was made to find the correct passageway into the uterine cavity and none was identified.  Given the risk of perforation of the uterus, the procedure was abandoned at this point in order to for safety to be assured.  After removing the hysteroscope she had no excessive bleeding.  The single-tooth tenaculum was removed and the cervix and was made hemostatic after the application of silver  nitrate.  Again, no blood was coming from the cervical os.  With hemostasis this portion of the procedure was terminated.  The vagina was visualized for any sponges or instruments left behind and none were found.  The speculum was removed.  A digital sweep of the vagina also verified that no instruments or sponges were left behind.  The urinary catheter was removed.  The patient tolerated the procedure well.  Sponge, lap, needle, and instrument counts were correct x 2.  VTE prophylaxis: SCDs. Antibiotic prophylaxis: none. She was awakened in the operating room and was taken to the PACU in stable condition.   Jeani Mill, MD 05/07/2023 2:00 PM

## 2023-05-07 NOTE — Interval H&P Note (Signed)
 History and Physical Interval Note:  05/07/2023 11:41 AM  Carolyn Clements  has presented today for surgery, with the diagnosis of menorrhagia with irregular cycle dysmenorrhea Encounter for sterilization.  The various methods of treatment have been discussed with the patient and family. After consideration of risks, benefits and other options for treatment, the patient has consented to  Procedure(s) with comments: SALPINGECTOMY, ROBOT-ASSISTED (Bilateral) ABLATION, ENDOMETRIUM, HYSTEROSCOPIC (N/A) - NOVASURE, IUD REMOVAL DILATATION AND CURETTAGE /HYSTEROSCOPY (N/A) as a surgical intervention.  The patient's history has been reviewed, patient examined, no change in status, stable for surgery.  I have reviewed the patient's chart and labs.  Questions were answered to the patient's satisfaction.  The patient wants a sure sterilization after removal of her intrauterine device and would like a tubal ligation in the form of salpingectomy.  I believe this is very  reasonable and will perform this to also reduce risk of ovarian cancer.    Jeani Mill, MD, [facility] 05/07/2023 11:42 AM

## 2023-05-07 NOTE — Transfer of Care (Signed)
 Immediate Anesthesia Transfer of Care Note  Patient: Carolyn Clements  Procedure(s) Performed: SALPINGECTOMY, ROBOT-ASSISTED (Bilateral: Abdomen) ABLATION, ENDOMETRIUM, HYSTEROSCOPIC HYSTEROSCOPY  Patient Location: PACU  Anesthesia Type:General  Level of Consciousness: drowsy  Airway & Oxygen Therapy: Patient Spontanous Breathing and Patient connected to nasal cannula oxygen  Post-op Assessment: Report given to RN and Post -op Vital signs reviewed and stable  Post vital signs: Reviewed and stable  Last Vitals:  Vitals Value Taken Time  BP 99/60 05/07/23 1407  Temp    Pulse 61 05/07/23 1411  Resp 17 05/07/23 1411  SpO2 89 % 05/07/23 1411  Vitals shown include unfiled device data.  Last Pain:  Vitals:   05/07/23 1004  TempSrc: Oral  PainSc: 0-No pain         Complications: No notable events documented.

## 2023-05-07 NOTE — Anesthesia Procedure Notes (Signed)
 Procedure Name: Intubation Date/Time: 05/07/2023 12:03 PM  Performed by: Donnamae Gaba, CRNAPre-anesthesia Checklist: Patient identified, Emergency Drugs available, Suction available and Patient being monitored Patient Re-evaluated:Patient Re-evaluated prior to induction Oxygen Delivery Method: Circle system utilized Preoxygenation: Pre-oxygenation with 100% oxygen Induction Type: IV induction Ventilation: Mask ventilation without difficulty Laryngoscope Size: McGrath and 3 Grade View: Grade I Tube type: Oral Tube size: 6.5 mm Number of attempts: 1 Airway Equipment and Method: Stylet Placement Confirmation: ETT inserted through vocal cords under direct vision, positive ETCO2 and breath sounds checked- equal and bilateral Secured at: 21 cm Tube secured with: Tape Dental Injury: Teeth and Oropharynx as per pre-operative assessment

## 2023-05-10 ENCOUNTER — Encounter: Payer: Self-pay | Admitting: Obstetrics and Gynecology

## 2023-05-10 LAB — SURGICAL PATHOLOGY

## 2023-05-10 NOTE — Anesthesia Postprocedure Evaluation (Signed)
 Anesthesia Post Note  Patient: Carolyn Clements  Procedure(s) Performed: SALPINGECTOMY, ROBOT-ASSISTED (Bilateral: Abdomen) HYSTEROSCOPY (Uterus) REMOVAL, INTRAUTERINE DEVICE (Uterus)  Patient location during evaluation: PACU Anesthesia Type: General Level of consciousness: awake and alert Pain management: pain level controlled Vital Signs Assessment: post-procedure vital signs reviewed and stable Respiratory status: spontaneous breathing, nonlabored ventilation and respiratory function stable Cardiovascular status: blood pressure returned to baseline and stable Postop Assessment: no apparent nausea or vomiting Anesthetic complications: no   No notable events documented.   Last Vitals:  Vitals:   05/07/23 1445 05/07/23 1528  BP:  134/89  Pulse: 64 68  Resp: 20 20  Temp:  36.6 C  SpO2: 100% 100%    Last Pain:  Vitals:   05/07/23 1528  TempSrc: Temporal  PainSc: 2                  Baltazar Bonier

## 2023-06-06 HISTORY — PX: OTHER SURGICAL HISTORY: SHX169

## 2023-06-22 NOTE — H&P (Signed)
 Preoperative History and Physical   Chief Complaint: Carolyn Clements is a 53 y.o. G3P2010 here for surgical management of menorrhagia with irregular cycle and dysmenorrhea.   No significant preoperative concerns.   History of Present Illness: 53 y.o. G77P2010 female who has a recent diagnosis of triple negative breast cancer. She has a history of menorrhagia with irregular cycles and dysmenorrhea who had a levonorgestrel  IUS. It had been recommended to her to have this removed due to her cancer.  She has had bad periods prior to her IUD and would have to miss school/work due to cramps and/or nausea.  She underwent an attempted endometrial ablation on 05/07/2023, but was unsuccessful due to being unable to cannulate her cervical canal and a false tract was created. Her IUD was removed and she did have a bilateral salpingectomy.  She would like to try for ablation again with ultrasound guidance.    Proposed surgery: Hysteroscopy, dilation and curettage, endometrial ablation.       Past Medical History:  Diagnosis Date   Arthritis      Osteo   Bipolar 1 disorder (CMS/HHS-HCC)     Breast cancer (CMS/HHS-HCC) 07/2021   Depression     Enchondroma, multiple (HHS-HCC)      as per onc problem list   GAD (generalized anxiety disorder)     GERD (gastroesophageal reflux disease)     Heart murmur     History of abnormal cervical Pap smear      ASCUS/HPV neg in 2018, ASCUS/HPV neg in 2020, LGSIL/HPV neg in 2021; PAP in 2022 normal   History of chemotherapy     History of dyspareunia in female 03/26/2019   History of migraine headaches      sinus related   History of radiation therapy     History of syncope 2016   Iron deficiency anemia     Mitral valve prolapse 07/06/2014    nonrheumatic mitral valve prolapse   Motion sickness      long car rides   PONV (postoperative nausea and vomiting)     Pyloric stenosis (HHS-HCC)      as an infant, had inguinal hernia repair   Past Surgical History:   Procedure Laterality Date   ARTHROSCOPIC REPAIR ACL     torn/repaired ACL, followed by patellar fractuer, high school [employer]   BREAST BIOPSY Left 07/25/2021   Us  Core BX 12:00 Ribbon clip - path pending   CESAREAN SECTION     2003 and 2004   COLONOSCOPY WITH PROPOFOL  N/A 06/27/2021   Procedure: COLONOSCOPY WITH PROPOFOL ;  Surgeon: Marnee Sink, MD;  Location: [facility];  Service: Endoscopy;  Laterality: N/A;  Latex   HERNIA REPAIR  01/05/1974   HYSTEROSCOPY WITH D & C N/A 05/07/2023   Procedure: HYSTEROSCOPY;  Surgeon: Kris Pester, MD;  Location: [facility];  Service: Gynecology;  Laterality: N/A;   IUD REMOVAL N/A 05/17/2013   Procedure: INTRAUTERINE DEVICE (IUD) REMOVAL;  Surgeon: Granville Layer, MD;  Location: [facility];  Service: Gynecology;  Laterality: N/A;  Replacement of IUD   IUD REMOVAL  05/07/2023   Procedure: REMOVAL, INTRAUTERINE DEVICE;  Surgeon: Kris Pester, MD;  Location: [facility];  Service: Gynecology;;   LYMPH NODE BIOPSY     MASTECTOMY PARTIAL / LUMPECTOMY Left 2023   NASAL SINUS SURGERY     x2   PYLOROPLASTY  1974   TOE SURGERY Left 01/05/2002   Enchondroma removal - great toe   TONSILLECTOMY Bilateral 05/16/2015  Procedure: TONSILLECTOMY;  Surgeon: Mellody Sprout, MD;  Location: [facility];  Service: ENT;  Laterality: Bilateral;  Latex sensitivity   TUMOR EXCISION N/A 04/01/2023   Procedure: TUMOR EXCISION RECTAL, polyp;  Surgeon: Alben Alma, MD;  Location: [facility];  Service: General;  Laterality: N/A;   XI ROBOTIC ASSISTED SALPINGECTOMY Bilateral 05/07/2023   Procedure: SALPINGECTOMY, ROBOT-ASSISTED;  Surgeon: Kris Pester, MD;  Location: [facility];  Service: Gynecology;  Laterality: Bilateral;                       OB History  Gravida Para Term Preterm AB Living  3 2 2   1     SAB IAB Ectopic Molar Multiple Live Births   1               # Outcome Date GA Lbr Len/2nd Weight Sex Type Anes PTL Lv  3 Term 2005 [redacted]w[redacted]d     F  CS-LTranv           Birth Comments: ERCS  2 Term 2003 [redacted]w[redacted]d     F CS-LTranv           Birth Comments: Emergent CS  1 SAB 2002 [redacted]w[redacted]d                   Birth Comments: D&C  Patient denies any other pertinent gynecologic issues.          Current Outpatient Medications on File Prior to Visit  Medication Sig Dispense Refill   buPROPion  (WELLBUTRIN ) 100 MG tablet 100 mg       fexofenadine (ALLEGRA) 180 MG tablet Take 180 mg by mouth as needed       gabapentin  (NEURONTIN ) 300 MG capsule Take 300 mg by mouth at bedtime       lamoTRIgine (LAMICTAL) 100 MG tablet Take 1 tablet by mouth at bedtime       lamoTRIgine (LAMICTAL) 200 MG tablet Take 200 mg by mouth 2 (two) times daily       levonorgestreL  (MIRENA  52 MG) IUD Insert into the uterus       omeprazole  (PRILOSEC) 20 MG DR capsule Take 20 mg by mouth once daily       traZODone (DESYREL) 100 MG tablet Take 200 mg by mouth at bedtime        No current facility-administered medications on file prior to visit.         Allergies  Allergen Reactions   Adhesive Tape-Silicones Itching and Rash      itching   whelts    itching   Codeine Itching, Nausea And Vomiting and Other (See Comments)      Chills, rash, fever, itching   Chills, rash, fever, itching    Says all narcotics cause itching and she tries to avoid   codeine   Silicones Itching and Rash      silicones   Sulfa (Sulfonamide Antibiotics) Hives and Other (See Comments)      FEVER, CHILLS   Hives and fever at age of 53, has not had since then    FEVER, CHILLS   Latex Rash   Adhesive Rash      Social History:   reports that she has never smoked. She has never used smokeless tobacco. She reports current alcohol use of about 3.0 - 4.0 standard drinks of alcohol per week. She reports current drug use. Frequency: 1.00 time per week. Drug: Other-see comments.  Family History  Problem Relation Name Age of Onset   Diabetes type II Mother Carolyn Clements     High blood pressure  (Hypertension) Mother Carolyn Clements     COPD Mother Carolyn Clements     Bladder Cancer Mother Carolyn Clements     Hyperlipidemia (Elevated cholesterol) Father Carolyn Clements     Prostate cancer Father Carolyn Clements     Lung cancer Father Carolyn Clements          adenocarcinoma in non-smoker   Pancreatic cancer Maternal Grandfather Carolyn Clements     Breast cancer Paternal Carolyn Clements     Lung cancer Paternal Grandfather       No Known Problems Brother       Anesthesia problems Neg Hx       Ovarian cancer Neg Hx          Review of Systems: Noncontributory   PHYSICAL EXAM: Blood pressure 102/67, pulse 73, height 157.5 cm (5' 2), weight 69.8 kg (153 lb 12.8 oz). CONSTITUTIONAL: Well-developed, well-nourished female in no acute distress.  HENT:  Normocephalic, atraumatic, External right and left ear normal. Oropharynx is clear and moist EYES: Conjunctivae and EOM are normal. Pupils are equal, round, and reactive to light. No scleral icterus.  NECK: Normal range of motion, supple, no masses SKIN: Skin is warm and dry. No rash noted. Not diaphoretic. No erythema. No pallor. NEUROLGIC: Alert and oriented to person, place, and time. Normal reflexes, muscle tone coordination. No cranial nerve deficit noted. PSYCHIATRIC: Normal mood and affect. Normal behavior. Normal judgment and thought content. CARDIOVASCULAR: Normal heart rate noted, regular rhythm RESPIRATORY: Effort and breath sounds normal, no problems with respiration noted ABDOMEN: Soft, nontender, nondistended. PELVIC: Deferred MUSCULOSKELETAL: Normal range of motion. No edema and no tenderness. 2+ distal pulses.     Assessment: 1. Menorrhagia with irregular cycle   2. Dysmenorrhea     Plan: Patient will undergo surgical management with the above noted surgery.   The risks of surgery were discussed in detail with the patient including but not limited to: bleeding which may require transfusion or reoperation; infection which may require antibiotics; injury to surrounding organs which may  involve bowel, bladder, ureters ; need for additional procedures including laparoscopy or laparotomy; thromboembolic phenomenon, surgical site problems and other postoperative/anesthesia complications. Likelihood of success in alleviating the patient's condition was discussed. Routine postoperative instructions will be reviewed with the patient and her family in detail after surgery.  The patient concurred with the proposed plan, giving informed written consent for the surgery.   Preoperative prophylactic antibiotics, as indicated, and SCDs ordered on call to the OR.       Jeani Mill, MD, [facility] 06/22/2023 1:35 PM

## 2023-06-23 ENCOUNTER — Encounter
Admission: RE | Admit: 2023-06-23 | Discharge: 2023-06-23 | Disposition: A | Discharge status: Home / Self Care | Source: Ambulatory Visit | Attending: Obstetrics and Gynecology | Admitting: Obstetrics and Gynecology

## 2023-06-23 ENCOUNTER — Other Ambulatory Visit: Payer: Self-pay

## 2023-06-23 DIAGNOSIS — Z01812 Encounter for preprocedural laboratory examination: Secondary | ICD-10-CM

## 2023-06-23 NOTE — Patient Instructions (Addendum)
 Your procedure is scheduled on: 07/01/23 - Thursday Report to the Registration Desk on the 1st floor of the [facility]. To find out your arrival time, please call [phone] between 1PM - 3PM on: 06/30/23 - Wednesday If your arrival time is 6:00 am, do not arrive before that time as the [facility] entrance doors do not open until 6:00 am.  REMEMBER: Instructions that are not followed completely may result in serious medical risk, up to and including death; or upon the discretion of your surgeon and anesthesiologist your surgery may need to be rescheduled.  Do not eat food after midnight the night before surgery.  No gum chewing or hard candies.  You may however, drink CLEAR liquids up to 2 hours before you are scheduled to arrive for your surgery. Do not drink anything within 2 hours of your scheduled arrival time.  Clear liquids include: - water   - apple juice without pulp - gatorade (not RED colors) - black coffee or tea (Do NOT add milk or creamers to the coffee or tea) Do NOT drink anything that is not on this list.   In addition, your doctor has ordered for you to drink the provided:  Ensure Pre-Surgery Clear Carbohydrate Drink  Drinking this carbohydrate drink up to two hours before surgery helps to reduce insulin resistance and improve patient outcomes.   One week prior to surgery: Stop Anti-inflammatories (NSAIDS) such as Advil , Aleve, Ibuprofen , Motrin , Naproxen, Naprosyn and Aspirin based products such as Excedrin, Goody's Powder, BC Powder. You may take Tylenol  if needed for pain up until the day of surgery.  Stop ANY OVER THE COUNTER supplements until after surgery.   ON THE DAY OF SURGERY ONLY TAKE THESE MEDICATIONS WITH SIPS OF WATER :  buPROPion  (WELLBUTRIN )  lamoTRIgine (LAMICTAL)  omeprazole  (PRILOSEC)    No Alcohol for 24 hours before or after surgery.  No Smoking including e-cigarettes for 24 hours before surgery.  No chewable tobacco products for at  least 6 hours before surgery.  No nicotine patches on the day of surgery.  Do not use any recreational drugs for at least a week (preferably 2 weeks) before your surgery.  Please be advised that the combination of cocaine and anesthesia may have negative outcomes, up to and including death. If you test positive for cocaine, your surgery will be cancelled.  On the morning of surgery brush your teeth with toothpaste and water , you may rinse your mouth with mouthwash if you wish. Do not swallow any toothpaste or mouthwash.   Do not wear jewelry, make-up, hairpins, clips or nail polish.  For welded (permanent) jewelry: bracelets, anklets, waist bands, etc.  Please have this removed prior to surgery.  If it is not removed, there is a chance that hospital personnel will need to cut it off on the day of surgery.  Do not wear lotions, powders, or perfumes.   Do not shave body hair from the neck down 48 hours before surgery.  Contact lenses, hearing aids and dentures may not be worn into surgery.  Do not bring valuables to the hospital. [facility] is not responsible for any missing/lost belongings or valuables.   Notify your doctor if there is any change in your medical condition (cold, fever, infection).  Wear comfortable clothing (specific to your surgery type) to the hospital.  After surgery, you can help prevent lung complications by doing breathing exercises.  Take deep breaths and cough every 1-2 hours. Your doctor may order a device called an  Incentive Spirometer to help you take deep breaths.  When coughing or sneezing, hold a pillow firmly against your incision with both hands. This is called "splinting." Doing this helps protect your incision. It also decreases belly discomfort.  If you are being admitted to the hospital overnight, leave your suitcase in the car. After surgery it may be brought to your room.  In case of increased patient census, it may be necessary for you, the  patient, to continue your postoperative care in the Same Day Surgery department.  If you are being discharged the day of surgery, you will not be allowed to drive home. You will need a responsible individual to drive you home and stay with you for 24 hours after surgery.   If you are taking public transportation, you will need to have a responsible individual with you.  Please call the Pre-admissions Testing Dept. at [phone] if you have any questions about these instructions.  Surgery Visitation Policy:  Patients having surgery or a procedure may have two visitors.  Children under the age of 93 must have an adult with them who is not the patient.  Inpatient Visitation:    Visiting hours are 7 a.m. to 8 p.m. Up to four visitors are allowed at one time in a patient room. The visitors may rotate out with other people during the day.  One visitor age 89 or older may stay with the patient overnight and must be in the room by 8 p.m.

## 2023-06-28 ENCOUNTER — Ambulatory Visit: Admission: EM | Admit: 2023-06-28 | Discharge: 2023-06-28 | Disposition: A | Discharge status: Home / Self Care

## 2023-06-28 DIAGNOSIS — T8149XA Infection following a procedure, other surgical site, initial encounter: Secondary | ICD-10-CM | POA: Diagnosis not present

## 2023-06-28 NOTE — ED Triage Notes (Signed)
 Pt c/o R thumb discharge & swelling x1 wk. States had surgery 1 wk ago for trigger thumb.

## 2023-06-28 NOTE — Discharge Instructions (Addendum)
 Please follow-up with your hand surgeon tomorrow regarding your possible wound infection from your trigger thumb release.

## 2023-06-28 NOTE — ED Provider Notes (Signed)
 [facility]    CSN: [id number] Arrival date & time: 06/28/23  1846      History   Chief Complaint Chief Complaint  Patient presents with   Finger Problem    HPI Carolyn Clements is a 53 y.o. female.   HPI  53 year old female with past medical her significant for bipolar type I disorder, iron deficiency anemia, migraine headaches, generalized anxiety disorder, and GERD presents for evaluation of swelling and yellow discharge from a surgical wound on her right thumb for a trigger thumb release that was performed 7 to 10 days ago with [facility].  She denies any fever.  She has not contacted her surgeon though she did have it evaluated by one of the PAs at the [facility] here in [place] that she works with.  Past Medical History:  Diagnosis Date   Anxiety    Bipolar 1 disorder (HCC)    Dyspareunia in female    Enchondroma, multiple    GERD (gastroesophageal reflux disease)    Heart murmur    History of chemotherapy    History of methicillin resistant staphylococcus aureus (MRSA) 2010   History of radiation therapy    IDA (iron deficiency anemia)    IUD    [facility] health  Dr Fredirick   Lymphedema    to left breast area-not left arm   Menorrhagia with irregular cycle    Migraine headache    Motion sickness    cars - long trips   MVP (mitral valve prolapse)    Saw cardiology for syncope 7/16-since then does not follow with cardiology   PONV (postoperative nausea and vomiting)    Pyloric stenosis infant   Rectal polyp    Screening for cervical cancer 04/2010   normal PAP    Triple negative breast cancer (HCC) 2023   Unilateral inguinal hernia in newborn    Wears contact lenses     Patient Active Problem List   Diagnosis Date Noted   Menorrhagia with irregular cycle 05/07/2023   Admission for sterilization 05/07/2023   Encounter for screening colonoscopy    Rectal polyp    Dyspareunia in female 03/26/2019   Bipolar 1 disorder (HCC) 03/26/2019    Hemorrhoids 12/20/2014   Nausea 12/20/2014   Congenital enchondromatosis 07/03/2014   Fatigue 11/07/2013   Anemia, iron deficiency 11/22/2012   Generalized anxiety disorder 01/07/2012   GERD (gastroesophageal reflux disease)    Migraine headache     Past Surgical History:  Procedure Laterality Date   ARTHROSCOPIC REPAIR ACL Left    torn/repaired ACL, followed by patellar fractuer, [employer]   BREAST BIOPSY Left 07/25/2021   Us  Core BX 12:00 Ribbon clip - path pending   CESAREAN SECTION     2003 and 2004   COLONOSCOPY WITH PROPOFOL  N/A 06/27/2021   Procedure: COLONOSCOPY WITH PROPOFOL ;  Surgeon: Jinny Carmine, MD;  Location: [facility];  Service: Endoscopy;  Laterality: N/A;  Latex   HERNIA REPAIR  01/05/1974   HYSTEROSCOPY WITH D & C N/A 05/07/2023   Procedure: HYSTEROSCOPY;  Surgeon: Leonce Garnette BIRCH, MD;  Location: [facility];  Service: Gynecology;  Laterality: N/A;   IUD REMOVAL N/A 05/17/2013   Procedure: INTRAUTERINE DEVICE (IUD) REMOVAL;  Surgeon: Glenys GORMAN Fredirick, MD;  Location: [facility];  Service: Gynecology;  Laterality: N/A;  Replacement of IUD   IUD REMOVAL  05/07/2023   Procedure: REMOVAL, INTRAUTERINE DEVICE;  Surgeon: Leonce Garnette BIRCH, MD;  Location: [facility];  Service:  Gynecology;;   LYMPH NODE BIOPSY     MASTECTOMY PARTIAL / LUMPECTOMY Left 2023   NASAL SINUS SURGERY     x2   PYLOROPLASTY  1974   TOE SURGERY Left 01/05/2002   Enchondroma removal - great toe   TONSILLECTOMY Bilateral 05/16/2015   Procedure: TONSILLECTOMY;  Surgeon: Deward Argue, MD;  Location: [facility];  Service: ENT;  Laterality: Bilateral;  Latex sensitivity   trigger thumb surgery Right 06/2023   TUBAL LIGATION     TUMOR EXCISION N/A 04/01/2023   Procedure: TUMOR EXCISION RECTAL, polyp;  Surgeon: Jordis Laneta FALCON, MD;  Location: [facility];  Service: General;  Laterality: N/A;   XI ROBOTIC ASSISTED SALPINGECTOMY Bilateral 05/07/2023   Procedure: SALPINGECTOMY,  ROBOT-ASSISTED;  Surgeon: Leonce Garnette BIRCH, MD;  Location: [facility];  Service: Gynecology;  Laterality: Bilateral;    OB History     Gravida  3   Para  2   Term  2   Preterm      AB  1   Living  2      SAB  1   IAB      Ectopic      Multiple      Live Births  2            Home Medications    Prior to Admission medications   Medication Sig Start Date End Date Taking? Authorizing Provider  acetaminophen  (TYLENOL ) 500 MG tablet Take 1,000 mg by mouth every 6 (six) hours as needed for moderate pain (pain score 4-6).    [provider]  B Complex-C (SUPER B COMPLEX PO) Take 1 capsule by mouth daily.    [provider]  buPROPion  (WELLBUTRIN ) 100 MG tablet Take 100 mg by mouth 2 (two) times daily.    [provider]  Cholecalciferol (VITAMIN D3 ULTRA STRENGTH) 125 MCG (5000 UT) capsule Take 5,000 Units by mouth daily.    [provider]  gabapentin  (NEURONTIN ) 300 MG capsule Take 300 mg by mouth at bedtime. 12/27/18   [provider]  ibuprofen  (ADVIL ) 600 MG tablet Take 1 tablet (600 mg total) by mouth every 6 (six) hours. Patient taking differently: Take 600 mg by mouth every 6 (six) hours as needed for moderate pain (pain score 4-6). 05/07/23   Leonce Garnette BIRCH, MD  lamoTRIgine (LAMICTAL) 200 MG tablet Take 200 mg by mouth 2 (two) times daily. 11/19/16   [provider]  Magnesium Bisglycinate (MAG GLYCINATE PO) Take 500 mg by mouth at bedtime.    [provider]  omeprazole  (PRILOSEC) 20 MG capsule Take 1 capsule (20 mg total) by mouth every morning. 05/07/23   Leonce Garnette BIRCH, MD  Probiotic Product (PROBIOTIC FORMULA PO) Take 1 capsule by mouth daily.    [provider]  traZODone (DESYREL) 100 MG tablet Take 200 mg by mouth at bedtime. 12/27/18   [provider]  triamcinolone (NASACORT ALLERGY 24HR) 55 MCG/ACT AERO nasal inhaler Place 2 sprays into the nose at bedtime.    [provider]    Family History Family History  Problem Relation Age of Onset   COPD Mother    Diabetes Mother    Hypertension Mother    Cancer Father        adenocarcinoma, nonsmoker   Cancer Maternal Grandfather        lung   Breast cancer Paternal Grandmother    Cancer Paternal Grandfather        lung  Social History Social History   Tobacco Use   Smoking status: Never    Passive exposure: Never   Smokeless tobacco: Never  Vaping Use   Vaping status: Never Used  Substance Use Topics   Alcohol use: Yes    Comment: 2 drinks on weekends   Drug use: Not Currently    Types: Marijuana     Allergies   Codeine sulfate, Sulfa antibiotics, Tape, and Latex   Review of Systems Review of Systems  Constitutional:  Negative for fever.  Skin:  Positive for color change and wound.     Physical Exam Triage Vital Signs ED Triage Vitals  Encounter Vitals Group     BP      Girls Systolic BP Percentile      Girls Diastolic BP Percentile      Boys Systolic BP Percentile      Boys Diastolic BP Percentile      Pulse      Resp      Temp      Temp src      SpO2      Weight      Height      Head Circumference      Peak Flow      Pain Score      Pain Loc      Pain Education      Exclude from Growth Chart    No data found.  Updated Vital Signs BP 119/78 (BP Location: Right Arm)   Pulse 78   Temp 98.8 F (37.1 C) (Oral)   Resp 16   Ht 5' 2 (1.575 m)   Wt 143 lb (64.9 kg)   SpO2 98%   BMI 26.16 kg/m   Visual Acuity Right Eye Distance:   Left Eye Distance:   Bilateral Distance:    Right Eye Near:   Left Eye Near:    Bilateral Near:     Physical Exam Vitals and nursing note reviewed.  Constitutional:      Appearance: Normal appearance. She is not ill-appearing.  HENT:     Head: Normocephalic and atraumatic.   Musculoskeletal:        General: Swelling present.   Skin:    General: Skin is warm and dry.     Capillary Refill: Capillary refill  takes less than 2 seconds.     Findings: Erythema present.   Neurological:     General: No focal deficit present.     Mental Status: She is alert and oriented to person, place, and time.      UC Treatments / Results  Labs (all labs ordered are listed, but only abnormal results are displayed) Labs Reviewed - No data to display  EKG   Radiology No results found.  Procedures Procedures (including critical care time)  Medications Ordered in UC Medications - No data to display  Initial Impression / Assessment and Plan / UC Course  I have reviewed the triage vital signs and the nursing notes.  Pertinent labs & imaging results that were available during my care of the patient were reviewed by me and considered in my medical decision making (see chart for details).   Patient is a nontoxic-appearing 53 year old presenting for evaluation of possible postoperative wound infection status post right trigger thumb release that occurred 7 to 10 days ago with [facility].  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  She has not contacted her surgeon.  I advised her that she is freshly postoperative and she needs to follow-up with her surgeon regarding any possibility of postoperative wound infections.  I did advise her that I would be happy to put her on antibiotics but not perform any other intervention given that she is freshly postoperative.  She said that she will contact her surgeon tomorrow and follow-up with them.     Final Clinical Impressions(s) / UC Diagnoses   Final diagnoses:  Surgical wound infection     Discharge Instructions      Please follow-up with your hand surgeon tomorrow regarding your possible wound infection from your trigger thumb release.      ED Prescriptions   None    PDMP not reviewed this encounter.   Bernardino Ditch, NP 06/28/23 [phone]

## 2023-06-29 ENCOUNTER — Other Ambulatory Visit: Payer: Self-pay | Admitting: Obstetrics and Gynecology

## 2023-06-29 DIAGNOSIS — Z8742 Personal history of other diseases of the female genital tract: Secondary | ICD-10-CM

## 2023-06-29 DIAGNOSIS — N946 Dysmenorrhea, unspecified: Secondary | ICD-10-CM

## 2023-06-30 ENCOUNTER — Other Ambulatory Visit

## 2023-07-01 ENCOUNTER — Ambulatory Visit: Admitting: Anesthesiology

## 2023-07-01 ENCOUNTER — Ambulatory Visit
Admission: RE | Admit: 2023-07-01 | Discharge: 2023-07-01 | Disposition: A | Discharge status: Home / Self Care | Source: Ambulatory Visit | Attending: Obstetrics and Gynecology | Admitting: Obstetrics and Gynecology

## 2023-07-01 ENCOUNTER — Encounter
Admission: RE | Disposition: A | Discharge status: Home / Self Care | Payer: Self-pay | Source: Home / Self Care | Attending: Obstetrics and Gynecology

## 2023-07-01 ENCOUNTER — Encounter: Payer: Self-pay | Admitting: Obstetrics and Gynecology

## 2023-07-01 ENCOUNTER — Other Ambulatory Visit: Payer: Self-pay

## 2023-07-01 ENCOUNTER — Ambulatory Visit
Admission: RE | Admit: 2023-07-01 | Discharge: 2023-07-01 | Disposition: A | Discharge status: Home / Self Care | Attending: Obstetrics and Gynecology | Admitting: Obstetrics and Gynecology

## 2023-07-01 DIAGNOSIS — K219 Gastro-esophageal reflux disease without esophagitis: Secondary | ICD-10-CM | POA: Insufficient documentation

## 2023-07-01 DIAGNOSIS — Z9221 Personal history of antineoplastic chemotherapy: Secondary | ICD-10-CM | POA: Diagnosis not present

## 2023-07-01 DIAGNOSIS — Z01812 Encounter for preprocedural laboratory examination: Secondary | ICD-10-CM

## 2023-07-01 DIAGNOSIS — Z1721 Progesterone receptor positive status: Secondary | ICD-10-CM | POA: Insufficient documentation

## 2023-07-01 DIAGNOSIS — Z170 Estrogen receptor positive status [ER+]: Secondary | ICD-10-CM | POA: Diagnosis not present

## 2023-07-01 DIAGNOSIS — N946 Dysmenorrhea, unspecified: Secondary | ICD-10-CM

## 2023-07-01 DIAGNOSIS — Z8742 Personal history of other diseases of the female genital tract: Secondary | ICD-10-CM

## 2023-07-01 DIAGNOSIS — N921 Excessive and frequent menstruation with irregular cycle: Secondary | ICD-10-CM | POA: Insufficient documentation

## 2023-07-01 DIAGNOSIS — Z803 Family history of malignant neoplasm of breast: Secondary | ICD-10-CM | POA: Insufficient documentation

## 2023-07-01 DIAGNOSIS — Z1731 Human epidermal growth factor receptor 2 positive status: Secondary | ICD-10-CM | POA: Diagnosis not present

## 2023-07-01 DIAGNOSIS — C50911 Malignant neoplasm of unspecified site of right female breast: Secondary | ICD-10-CM | POA: Insufficient documentation

## 2023-07-01 DIAGNOSIS — I341 Nonrheumatic mitral (valve) prolapse: Secondary | ICD-10-CM | POA: Insufficient documentation

## 2023-07-01 DIAGNOSIS — Z923 Personal history of irradiation: Secondary | ICD-10-CM | POA: Insufficient documentation

## 2023-07-01 HISTORY — PX: OPERATIVE ULTRASOUND: SHX5996

## 2023-07-01 HISTORY — PX: HYSTEROSCOPY WITH D & C: SHX1775

## 2023-07-01 LAB — POCT PREGNANCY, URINE: Preg Test, Ur: NEGATIVE

## 2023-07-01 SURGERY — US INTRAOPERATIVE
Anesthesia: General | Site: Uterus

## 2023-07-01 MED ORDER — SCOPOLAMINE 1 MG/3DAYS TD PT72
MEDICATED_PATCH | TRANSDERMAL | Status: AC
Start: 2023-07-01 — End: 2023-07-01
  Filled 2023-07-01: qty 1

## 2023-07-01 MED ORDER — LACTATED RINGERS IV SOLN: INTRAVENOUS | Status: DC

## 2023-07-01 MED ORDER — PHENYLEPHRINE 80 MCG/ML (10ML) SYRINGE FOR IV PUSH (FOR BLOOD PRESSURE SUPPORT)
PREFILLED_SYRINGE | INTRAVENOUS | Status: DC | PRN
  Administered 2023-07-01 (×5): 80 ug via INTRAVENOUS
  Administered 2023-07-01: 40 ug via INTRAVENOUS

## 2023-07-01 MED ORDER — ACETAMINOPHEN 10 MG/ML IV SOLN: 1000.0000 mg | Freq: Once | INTRAVENOUS | Status: DC | PRN

## 2023-07-01 MED ORDER — PROPOFOL 10 MG/ML IV BOLUS
INTRAVENOUS | Status: AC
  Filled 2023-07-01: qty 20

## 2023-07-01 MED ORDER — CHLORHEXIDINE GLUCONATE 0.12 % MT SOLN
OROMUCOSAL | Status: AC
  Filled 2023-07-01: qty 15

## 2023-07-01 MED ORDER — MIDAZOLAM HCL 2 MG/2ML IJ SOLN
INTRAMUSCULAR | Status: DC | PRN
  Administered 2023-07-01: 2 mg via INTRAVENOUS

## 2023-07-01 MED ORDER — 0.9 % SODIUM CHLORIDE (POUR BTL) OPTIME
TOPICAL | Status: DC | PRN
  Administered 2023-07-01: 500 mL

## 2023-07-01 MED ORDER — HYDROCODONE-ACETAMINOPHEN 5-325 MG PO TABS: 1.0000 | ORAL_TABLET | Freq: Three times a day (TID) | ORAL | 0 refills | Status: DC | PRN

## 2023-07-01 MED ORDER — OXYCODONE HCL 5 MG PO TABS: 5.0000 mg | ORAL_TABLET | Freq: Once | ORAL | Status: DC | PRN

## 2023-07-01 MED ORDER — OXYCODONE HCL 5 MG/5ML PO SOLN: 5.0000 mg | Freq: Once | ORAL | Status: DC | PRN

## 2023-07-01 MED ORDER — PROPOFOL 10 MG/ML IV BOLUS
INTRAVENOUS | Status: AC
Start: 2023-07-01 — End: 2023-07-01
  Filled 2023-07-01: qty 20

## 2023-07-01 MED ORDER — ONDANSETRON HCL 4 MG/2ML IJ SOLN
INTRAMUSCULAR | Status: DC | PRN
  Administered 2023-07-01: 4 mg via INTRAVENOUS

## 2023-07-01 MED ORDER — ONDANSETRON HCL 4 MG/2ML IJ SOLN: 4.0000 mg | Freq: Once | INTRAMUSCULAR | Status: DC | PRN

## 2023-07-01 MED ORDER — ORAL CARE MOUTH RINSE: 15.0000 mL | Freq: Once | OROMUCOSAL | Status: AC

## 2023-07-01 MED ORDER — ACETAMINOPHEN 10 MG/ML IV SOLN
INTRAVENOUS | Status: DC | PRN
Start: 2023-07-01 — End: 2023-07-01
  Administered 2023-07-01: 1000 mg via INTRAVENOUS

## 2023-07-01 MED ORDER — LIDOCAINE HCL (PF) 2 % IJ SOLN
INTRAMUSCULAR | Status: AC
  Filled 2023-07-01: qty 5

## 2023-07-01 MED ORDER — FENTANYL CITRATE (PF) 100 MCG/2ML IJ SOLN: 25.0000 ug | INTRAMUSCULAR | Status: DC | PRN

## 2023-07-01 MED ORDER — LIDOCAINE HCL (CARDIAC) PF 100 MG/5ML IV SOSY
PREFILLED_SYRINGE | INTRAVENOUS | Status: DC | PRN
  Administered 2023-07-01: 80 mg via INTRAVENOUS

## 2023-07-01 MED ORDER — FENTANYL CITRATE (PF) 100 MCG/2ML IJ SOLN
INTRAMUSCULAR | Status: DC | PRN
  Administered 2023-07-01 (×2): 50 ug via INTRAVENOUS

## 2023-07-01 MED ORDER — MIDAZOLAM HCL 2 MG/2ML IJ SOLN
INTRAMUSCULAR | Status: AC
  Filled 2023-07-01: qty 2

## 2023-07-01 MED ORDER — ACETAMINOPHEN 10 MG/ML IV SOLN
INTRAVENOUS | Status: AC
  Filled 2023-07-01: qty 100

## 2023-07-01 MED ORDER — PHENYLEPHRINE 80 MCG/ML (10ML) SYRINGE FOR IV PUSH (FOR BLOOD PRESSURE SUPPORT)
PREFILLED_SYRINGE | INTRAVENOUS | Status: AC
Start: 2023-07-01 — End: 2023-07-01
  Filled 2023-07-01: qty 10

## 2023-07-01 MED ORDER — SILVER NITRATE-POT NITRATE 75-25 % EX MISC
CUTANEOUS | Status: DC | PRN
  Administered 2023-07-01: 4

## 2023-07-01 MED ORDER — CHLORHEXIDINE GLUCONATE 0.12 % MT SOLN
15.0000 mL | Freq: Once | OROMUCOSAL | Status: AC
  Administered 2023-07-01: 15 mL via OROMUCOSAL

## 2023-07-01 MED ORDER — IBUPROFEN 600 MG PO TABS: 600.0000 mg | ORAL_TABLET | Freq: Four times a day (QID) | ORAL | 0 refills | Status: DC

## 2023-07-01 MED ORDER — SCOPOLAMINE 1 MG/3DAYS TD PT72
1.0000 | MEDICATED_PATCH | TRANSDERMAL | Status: DC
  Administered 2023-07-01: 1.5 mg via TRANSDERMAL

## 2023-07-01 MED ORDER — PROPOFOL 10 MG/ML IV BOLUS
INTRAVENOUS | Status: DC | PRN
  Administered 2023-07-01: 200 mg via INTRAVENOUS
  Administered 2023-07-01: 10 ug/kg/min via INTRAVENOUS

## 2023-07-01 MED ORDER — EPHEDRINE SULFATE-NACL 50-0.9 MG/10ML-% IV SOSY
PREFILLED_SYRINGE | INTRAVENOUS | Status: DC | PRN
  Administered 2023-07-01: 5 mg via INTRAVENOUS
  Administered 2023-07-01: 10 mg via INTRAVENOUS

## 2023-07-01 MED ORDER — FENTANYL CITRATE (PF) 100 MCG/2ML IJ SOLN
INTRAMUSCULAR | Status: AC
  Filled 2023-07-01: qty 2

## 2023-07-01 MED ORDER — DEXAMETHASONE SODIUM PHOSPHATE 10 MG/ML IJ SOLN
INTRAMUSCULAR | Status: DC | PRN
  Administered 2023-07-01: 10 mg via INTRAVENOUS

## 2023-07-01 SURGICAL SUPPLY — 27 items
ABLATOR SURESOUND NOVASURE (ABLATOR) ×1 IMPLANT
BAG URINE DRAIN 2000ML AR STRL (UROLOGICAL SUPPLIES) IMPLANT
CATH URTH 16FR FL 2W BLN LF (CATHETERS) ×1 IMPLANT
CUP MEDICINE 2OZ PLAST GRAD ST (MISCELLANEOUS) ×1 IMPLANT
DEVICE MYOSURE LITE (MISCELLANEOUS) ×1 IMPLANT
DEVICE MYOSURE REACH (MISCELLANEOUS) IMPLANT
DRAPE SHEET LG 3/4 BI-LAMINATE (DRAPES) ×1 IMPLANT
DRSG TELFA 3X8 NADH STRL (GAUZE/BANDAGES/DRESSINGS) IMPLANT
ELECTRODE REM PT RTRN 9FT ADLT (ELECTROSURGICAL) ×3 IMPLANT
GLOVE BIO SURGEON STRL SZ7 (GLOVE) ×5 IMPLANT
GLOVE BIOGEL PI IND STRL 7.5 (GLOVE) ×5 IMPLANT
GOWN STRL REUS W/ TWL LRG LVL3 (GOWN DISPOSABLE) ×9 IMPLANT
KIT PROCEDURE FLUENT (KITS) ×4 IMPLANT
KIT TURNOVER CYSTO (KITS) ×4 IMPLANT
MANIFOLD NEPTUNE II (INSTRUMENTS) ×4 IMPLANT
NS IRRIG 500ML POUR BTL (IV SOLUTION) ×1 IMPLANT
PACK DNC HYST (MISCELLANEOUS) ×4 IMPLANT
PAD OB MATERNITY 11 LF (PERSONAL CARE ITEMS) ×4 IMPLANT
PAD PREP OB/GYN DISP 24X41 (PERSONAL CARE ITEMS) ×4 IMPLANT
SCRUB CHG 4% DYNA-HEX 4OZ (MISCELLANEOUS) ×4 IMPLANT
SEAL ROD LENS SCOPE MYOSURE (ABLATOR) ×4 IMPLANT
SET CYSTO W/LG BORE CLAMP LF (SET/KITS/TRAYS/PACK) IMPLANT
SOL .9 NS 3000ML IRR UROMATIC (IV SOLUTION) ×4 IMPLANT
SURGILUBE 2OZ TUBE FLIPTOP (MISCELLANEOUS) ×3 IMPLANT
TRAP FLUID SMOKE EVACUATOR (MISCELLANEOUS) ×3 IMPLANT
TUBING CONNECTING 10 (TUBING) ×4 IMPLANT
WATER STERILE IRR 500ML POUR (IV SOLUTION) ×3 IMPLANT

## 2023-07-01 NOTE — Op Note (Signed)
 Operative Note   Name: Carolyn Clements  Date of Service: 07/01/2023  DOB: 10/13/1970  MRN: [id number]   PRE-OP DIAGNOSIS:  1) Menorrhagia with irregular cycle 2) Breast cancer, precluding use of hormones   POST-OP DIAGNOSIS:  1) Menorrhagia with irregular cycle 2) Breast cancer, precluding use of hormones   SURGEON: Surgeons and Role:    DEWAINE Leonce Garnette JONETTA, MD - Primary  PROCEDURE: Procedure(s): ENDOMETRIUM, HYSTEROSCOPIC DILATION AND CURETTAGE US  INTRAOPERATIVE  ABANDONED ENDOMETRIAL ABLATION  ANESTHESIA: General  ESTIMATED BLOOD LOSS: 30 mL  DRAINS: none   TOTAL IV FLUIDS: 750 mL  SPECIMENS:  Endometrial curettings  VTE PROPHYLAXIS: SCDs to the bilateral lower extremities  ANTIBIOTICS: none indicated  FLUID DEFICIT: 300 mL  COMPLICATIONS: none  DISPOSITION: PACU - hemodynamically stable.  CONDITION: stable  INDICATION: 53 y.o. [id number] female with triple negative breast cancer of the right breast, advised by oncology to have her IUD removed, which was placed for menorrhagia. She previously underwent a laparoscopic tubal ligation and failed attempt at endometrial ablation due to the creation of a false tract in her cervix.  She was brought to the OR to attempt under ultrasound guidance entry into her uterine cavity.  FINDINGS: Exam under anesthesia revealed small, mobile anteverted uterus with no masses and bilateral adnexa without masses or fullness. Hysteroscopy revealed a grossly normal appearing uterine cavity with bilateral tubal ostia and normal appearing endocervical canal.  The endometrium appeared pale and thin, with a shallow depth and an elongated cervical length.  PROCEDURE IN DETAIL:  After informed consent was obtained, the patient was taken to the operating room where anesthesia was obtained without difficulty. The patient was positioned in the dorsal lithotomy position in [place] stirrups.   A sonographer was at the bedside with her arm under the  drape with the ultrasound screen visible by me.  I did not catheterize her bladder at the beginning of the procedure in order to facilitate better visualization.  The patient was examined under anesthesia, with the above noted findings.  The bi-valved speculum was placed inside the patient's vagina, and the the anterior lip of the cervix was grasped with the tenaculum.  Then the cervix was progressively dilated to a 7 mm Hegar dilator.  There was some difficulty in fully dilating the cervix to 7 mm.  After 5 mm the 6 mm dilator did not easily pass and on visualization was not headed through the endocervical canal.  Ultimately with ultrasound guidance the dilator was able to be passed into the cavity.  The hysteroscope was then able to be passed under direct visualization into the uterine cavity with the above-noted findings.  The hysteroscope was removed.  The curettage was attempted to be placed and initially was unsuccessful but ultimately I was able to place the curettage and obtain an endometrial sample.  I attempted to measure the depth of the cavity to assess its adequacy for ablation.  After several attempts and being sure that the measuring device was in the uterine cavity, the cavity depth measured to 3 cm, which is below the 4 cm recommended minimum by the manufacturer.  Given the short cavity depth and very likely insufficient cavity with, the procedure was not carried further and the ablation was not performed.  This terminated the procedure.  All instruments were removed from the uterus.  The tenaculum was removed from the cervix.  Hemostasis was obtained using silver  nitrate.  No bleeding was coming from the cervix and hemostasis was appreciated.  The speculum was removed and the vagina was verified to be free of instruments and sponges.  The bladder was then catheterized at the end of the procedure.    She was then taken out of dorsal lithotomy.  The patient tolerated the procedure well.  Sponge,  lap and needle counts were correct x2.  The patient was taken to recovery room in excellent condition.  Garnette CHARM Mace, MD, FACOG 07/01/2023 3:16 PM

## 2023-07-01 NOTE — Anesthesia Preprocedure Evaluation (Addendum)
 Anesthesia Evaluation  Patient identified by MRN, date of birth, ID band Patient awake    Reviewed: Allergy & Precautions, NPO status , Patient's Chart, lab work & pertinent test results  History of Anesthesia Complications (+) PONV and history of anesthetic complications  Airway Mallampati: IV   Neck ROM: Full    Dental no notable dental hx.    Pulmonary neg pulmonary ROS   Pulmonary exam normal breath sounds clear to auscultation       Cardiovascular Exercise Tolerance: Good Normal cardiovascular exam+ Valvular Problems/Murmurs MVP  Rhythm:Regular Rate:Normal     Neuro/Psych  PSYCHIATRIC DISORDERS Anxiety  Bipolar Disorder   negative neurological ROS     GI/Hepatic ,GERD  ,,  Endo/Other  negative endocrine ROS    Renal/GU negative Renal ROS     Musculoskeletal   Abdominal   Peds  Hematology Breast CA   Anesthesia Other Findings   Reproductive/Obstetrics                             Anesthesia Physical Anesthesia Plan  ASA: 2  Anesthesia Plan: General   Post-op Pain Management:    Induction: Intravenous  PONV Risk Score and Plan: 4 or greater and Ondansetron , Dexamethasone , Treatment may vary due to age or medical condition and Scopolamine  patch - Pre-op  Airway Management Planned: LMA  Additional Equipment:   Intra-op Plan:   Post-operative Plan: Extubation in OR  Informed Consent: I have reviewed the patients History and Physical, chart, labs and discussed the procedure including the risks, benefits and alternatives for the proposed anesthesia with the patient or authorized representative who has indicated his/her understanding and acceptance.     Dental advisory given  Plan Discussed with: CRNA  Anesthesia Plan Comments: (Patient consented for risks of anesthesia including but not limited to:  - adverse reactions to medications - damage to eyes, teeth, lips or  other oral mucosa - nerve damage due to positioning  - sore throat or hoarseness - damage to heart, brain, nerves, lungs, other parts of body or loss of life  Informed patient about role of CRNA in peri- and intra-operative care.  Patient voiced understanding.)        Anesthesia Quick Evaluation

## 2023-07-01 NOTE — Transfer of Care (Signed)
 Immediate Anesthesia Transfer of Care Note  Patient: Carolyn Clements  Procedure(s) Performed: ENDOMETRIUM, HYSTEROSCOPIC (Cervix) DILATION AND CURETTAGE (Cervix) US  INTRAOPERATIVE  Patient Location: PACU  Anesthesia Type:General  Level of Consciousness: drowsy  Airway & Oxygen Therapy: Patient Spontanous Breathing and Patient connected to face mask oxygen  Post-op Assessment: Report given to RN and Post -op Vital signs reviewed and stable  Post vital signs: Reviewed and stable  Last Vitals:  Vitals Value Taken Time  BP 123/71 1524  Temp 35.9 1524  Pulse 78 07/01/23 15:24  Resp 18 07/01/23 15:24  SpO2 95 % 07/01/23 15:24  Vitals shown include unfiled device data.  Last Pain:  Vitals:   07/01/23 1221  TempSrc: Temporal  PainSc: 0-No pain         Complications: No notable events documented.

## 2023-07-01 NOTE — Interval H&P Note (Signed)
 History and Physical Interval Note:  07/01/2023 1:43 PM  Carolyn Clements  has presented today for surgery, with the diagnosis of menorrhagia with irregular cycle dysmenorrhea.  The various methods of treatment have been discussed with the patient and family. After consideration of risks, benefits and other options for treatment, the patient has consented to  Procedure(s) with comments: ABLATION, ENDOMETRIUM, HYSTEROSCOPIC (N/A) - NOVASURE - ULTRASOUND GUIDED WITH A SONOGRAPHER DILATION AND CURETTAGE (N/A) US  INTRAOPERATIVE (N/A) as a surgical intervention.  The patient's history has been reviewed, patient examined, no change in status, stable for surgery.  I have reviewed the patient's chart and labs.  Questions were answered to the patient's satisfaction.    Garnette Mace, MD, [facility] 07/01/2023 1:43 PM

## 2023-07-01 NOTE — Anesthesia Procedure Notes (Signed)
 Procedure Name: LMA Insertion Date/Time: 07/01/2023 1:59 PM  Performed by: Jackye Spanner, CRNAPre-anesthesia Checklist: Patient identified, Patient being monitored, Timeout performed, Emergency Drugs available and Suction available Patient Re-evaluated:Patient Re-evaluated prior to induction Oxygen Delivery Method: Circle system utilized Preoxygenation: Pre-oxygenation with 100% oxygen Induction Type: IV induction and Inhalational induction Ventilation: Mask ventilation without difficulty LMA: LMA inserted LMA Size: 3.0 Tube type: Oral Number of attempts: 1 Placement Confirmation: positive ETCO2 and breath sounds checked- equal and bilateral Tube secured with: Tape Dental Injury: Teeth and Oropharynx as per pre-operative assessment  Comments: Smooth atraumatic LMA placement, no complications noted.

## 2023-07-02 ENCOUNTER — Encounter: Payer: Self-pay | Admitting: Obstetrics and Gynecology

## 2023-07-02 LAB — SURGICAL PATHOLOGY

## 2023-07-02 NOTE — Anesthesia Postprocedure Evaluation (Signed)
 Anesthesia Post Note  Patient: Lidya Avent [place]ham  Procedure(s) Performed: US  INTRAOPERATIVE DILATATION AND CURETTAGE /HYSTEROSCOPY with myosure (Uterus)  Patient location during evaluation: PACU Anesthesia Type: General Level of consciousness: awake and alert Pain management: pain level controlled Vital Signs Assessment: post-procedure vital signs reviewed and stable Respiratory status: spontaneous breathing, nonlabored ventilation, respiratory function stable and patient connected to nasal cannula oxygen Cardiovascular status: blood pressure returned to baseline and stable Postop Assessment: no apparent nausea or vomiting Anesthetic complications: no   No notable events documented.   Last Vitals:  Vitals:   07/01/23 1600 07/01/23 1618  BP: 102/82 128/82  Pulse: 77 76  Resp: 13 18  Temp: (!) 36.3 C 36.4 C  SpO2: 100% 100%    Last Pain:  Vitals:   07/01/23 1618  TempSrc: Oral  PainSc: 0-No pain                 Prentice Murphy

## 2023-07-14 NOTE — Progress Notes (Signed)
 Postoperative Follow-up Patient presents post op from hysteroscopy, dilation and curettage and endometrial ablation (terminated due to cavity length) on 07/01/2023 for menorrhagia with irregular cycle and dysmenorrhea.  Subjective: Eating a regular diet without difficulty. The patient is not having any pain.  Activity: normal activities of daily living.  She denies fever, chills, nausea, and vomiting. She has had some vaginal bleeding since the procedure, but this has lightened up significantly. The ablation procedure was terminated due to the fact that the cavity depth was not sufficient to perform the ablation.  The depth measured 3 cm, with the recommended depth being 4 cm as a minimum.   She is having significant hot flashes during the day and would like to try Veozah.    PATHOLOGY REPORT: FINAL DIAGNOSIS        1. Endometrium, curettage,  :       - SCANT BENIGN INACTIVE ENDOMETRIUM BENIGN SMOOTH MUSCLE   Objective: Vital Signs: BP 115/79   Pulse 66   Wt 67.2 kg (148 lb 3.2 oz)   BMI 27.11 kg/m  Physical Exam Constitutional:      Appearance: Normal appearance.  HENT:     Head: Normocephalic and atraumatic.   Eyes:     General: No scleral icterus.    Conjunctiva/sclera: Conjunctivae normal.   Pulmonary:     Effort: Pulmonary effort is normal. No respiratory distress.   Musculoskeletal:        General: No swelling. Normal range of motion.   Neurological:     General: No focal deficit present.     Mental Status: She is alert and oriented to person, place, and time.     Cranial Nerves: No cranial nerve deficit.   Skin:    General: Skin is warm and dry.     Findings: No lesion.   Psychiatric:        Mood and Affect: Mood normal.        Behavior: Behavior normal.        Judgment: Judgment normal.     Assessment: 53 y.o. s/p ultrasound guided hysteroscopy, with dilation and curettage and abandoned ablation of the endometrium.  Plan:  Activity plan: No  restriction.  She would like to try Veozah. Will obtain baseline labs, as recommended.    She wanted to discuss treatment for hot flashes. She has few options regarding this since her hematologist is recommending that she not utilize hormonal medication.  She asked if she could have Veozah.  Will check her baseline labs and otherwise she should be ok to use this medication.   Discussed warning signs to look out for: - feeling more tired than you do usually -decreased appetite -nausea -vomiting -itching -yellowing of the eyes or skin (jaundice) -pale feces -dark urine -pain in the stomach (abdomen)  Return in about 1 year (around 07/13/2024) for Annual Gyn Exam.    Attestation Statement:   I personally performed the service. (TP)  STEPHEN TORIBIO MACE, MD  [facility] 07/14/2023 4:52 PM

## 2023-08-15 ENCOUNTER — Emergency Department

## 2023-08-15 ENCOUNTER — Emergency Department
Admission: EM | Admit: 2023-08-15 | Discharge: 2023-08-15 | Disposition: A | Discharge status: Home / Self Care | Attending: Emergency Medicine | Admitting: Emergency Medicine

## 2023-08-15 ENCOUNTER — Other Ambulatory Visit: Payer: Self-pay

## 2023-08-15 DIAGNOSIS — W19XXXA Unspecified fall, initial encounter: Secondary | ICD-10-CM

## 2023-08-15 DIAGNOSIS — R519 Headache, unspecified: Secondary | ICD-10-CM

## 2023-08-15 DIAGNOSIS — S0592XA Unspecified injury of left eye and orbit, initial encounter: Secondary | ICD-10-CM | POA: Diagnosis present

## 2023-08-15 DIAGNOSIS — S0012XA Contusion of left eyelid and periocular area, initial encounter: Secondary | ICD-10-CM | POA: Insufficient documentation

## 2023-08-15 DIAGNOSIS — R55 Syncope and collapse: Secondary | ICD-10-CM | POA: Diagnosis not present

## 2023-08-15 DIAGNOSIS — W01198A Fall on same level from slipping, tripping and stumbling with subsequent striking against other object, initial encounter: Secondary | ICD-10-CM | POA: Diagnosis not present

## 2023-08-15 LAB — CBC
HCT: 34.9 % — ABNORMAL LOW (ref 36.0–46.0)
Hemoglobin: 11.8 g/dL — ABNORMAL LOW (ref 12.0–15.0)
MCH: 31.1 pg (ref 26.0–34.0)
MCHC: 33.8 g/dL (ref 30.0–36.0)
MCV: 92.1 fL (ref 80.0–100.0)
Platelets: 154 K/uL (ref 150–400)
RBC: 3.79 MIL/uL — ABNORMAL LOW (ref 3.87–5.11)
RDW: 12.4 % (ref 11.5–15.5)
WBC: 11.9 K/uL — ABNORMAL HIGH (ref 4.0–10.5)
nRBC: 0 % (ref 0.0–0.2)

## 2023-08-15 LAB — TROPONIN I (HIGH SENSITIVITY): Troponin I (High Sensitivity): 5 ng/L (ref <18)

## 2023-08-15 LAB — COMPREHENSIVE METABOLIC PANEL WITH GFR
ALT: 15 U/L (ref 0–44)
AST: 23 U/L (ref 15–41)
Albumin: 4 g/dL (ref 3.5–5.0)
Alkaline Phosphatase: 82 U/L (ref 38–126)
Anion gap: 8 (ref 5–15)
BUN: 26 mg/dL — ABNORMAL HIGH (ref 6–20)
CO2: 24 mmol/L (ref 22–32)
Calcium: 9 mg/dL (ref 8.9–10.3)
Chloride: 103 mmol/L (ref 98–111)
Creatinine, Ser: 0.78 mg/dL (ref 0.44–1.00)
GFR, Estimated: >60 mL/min (ref >60)
Glucose, Bld: 165 mg/dL — ABNORMAL HIGH (ref 70–99)
Potassium: 3.8 mmol/L (ref 3.5–5.1)
Sodium: 135 mmol/L (ref 135–145)
Total Bilirubin: 0.8 mg/dL (ref 0.0–1.2)
Total Protein: 7 g/dL (ref 6.5–8.1)

## 2023-08-15 LAB — HCG, QUANTITATIVE, PREGNANCY: hCG, Beta Chain, Quant, S: 1 m[IU]/mL (ref <5)

## 2023-08-15 NOTE — Discharge Instructions (Signed)
 Please make sure to keep yourself hydrated especially before you go to bed at night.  I have also put in a referral for referral for cardiology.

## 2023-08-15 NOTE — ED Notes (Signed)
 Pt completed oral rehydration at this time

## 2023-08-15 NOTE — ED Triage Notes (Signed)
 Pt to ED for facial injury from syncopal episode at 5am this AM when she had just gotten up to pee. States hx of similar episodes. Hx orthostatic hypotension. L periorbital area above and below are swollen and bruised. Medial conjunctiva is red. States she hit her head when she fell and then went to sleep. Denies vision changes.States she is always a little dizzy. No blood thinners. Last period was April, perimenopausal.

## 2023-08-15 NOTE — ED Notes (Signed)
 Pt given Gatorlyte at this time.

## 2023-08-15 NOTE — ED Provider Notes (Signed)
 [facility] Provider Note    Event Date/Time   First MD Initiated Contact with Patient 08/15/23 1603     (approximate)   History   Facial Injury and Loss of Consciousness   HPI  Carolyn Clements is a 53 y.o. female with history of GERD, GAD, bipolar disorder presenting with a fall.  States it occurred about 5 AM today.  She got up to void, passed out, hit her face.  States that this is typical for her, this has happened 5 times in the last 8 years, states that she feels lightheaded, feels tunnel vision and then passes out.  Denies any vision changes, not on any blood thinners, no focal weakness or numbness.  Does have history of orthostatic hypotension and states that this has happened in the past.  She denies any infectious symptoms.  No chest pain or shortness of breath, no focal weakness or numbness, no slurred speech or facial droop.  States that she has been seen by cardiology in the past, had a 30-day Holter monitor, states that they do not know why she passes out.  Last cardiology follow-up was prepandemic.  She denies any vaginal discharge or bleeding.  States her last menstrual period was years ago.  On independent chart review, she was admitted for surgical management of menorrhagia with a diagnosis of regular periods and dysmenorrhea.  This admission was at the end of June.  She went for hysteroscopy, D&C as well as endometrial ablation was terminated due to cavity length.  When she was seen for follow-up with OB early July, she was not having any pain.     Physical Exam   Triage Vital Signs: ED Triage Vitals  Encounter Vitals Group     BP 08/15/23 1407 111/73     Girls Systolic BP Percentile --      Girls Diastolic BP Percentile --      Boys Systolic BP Percentile --      Boys Diastolic BP Percentile --      Pulse Rate 08/15/23 1407 95     Resp 08/15/23 1407 20     Temp 08/15/23 1407 98 F (36.7 C)     Temp Source 08/15/23 1407 Oral     SpO2  08/15/23 1407 100 %     Weight 08/15/23 1408 141 lb (64 kg)     Height 08/15/23 1408 5' 2 (1.575 m)     Head Circumference --      Peak Flow --      Pain Score 08/15/23 1407 2     Pain Loc --      Pain Education --      Exclude from Growth Chart --     Most recent vital signs: Vitals:   08/15/23 1407 08/15/23 1604  BP: 111/73 121/87  Pulse: 95 73  Resp: 20 12  Temp: 98 F (36.7 C)   SpO2: 100% 100%     General: Awake, no distress.  CV:  Good peripheral perfusion.  Resp:  Normal effort.  No thoracic cage tenderness Abd:  No distention.  Soft nontender Other:  No palpable skull deformities or tenderness, no midline spinal tenderness, she does have periorbital ecchymoses on the left with subconjunctival hemorrhage on the left, is tender to palpation over her left zygoma.  No hemotympanum bilaterally, her teeth are aligned, no trismus.  Clear oropharynx.  No tenderness to all 4 extremities, no focal weakness or numbness, equal radial pulses and DP pulses  bilaterally.   ED Results / Procedures / Treatments   Labs (all labs ordered are listed, but only abnormal results are displayed) Labs Reviewed  COMPREHENSIVE METABOLIC PANEL WITH GFR - Abnormal; Notable for the following components:      Result Value   Glucose, Bld 165 (*)    BUN 26 (*)    All other components within normal limits  CBC - Abnormal; Notable for the following components:   WBC 11.9 (*)    RBC 3.79 (*)    Hemoglobin 11.8 (*)    HCT 34.9 (*)    All other components within normal limits  HCG, QUANTITATIVE, PREGNANCY  CBG MONITORING, ED  TROPONIN I (HIGH SENSITIVITY)     EKG  EKG shows, sinus rhythm, rate 89, normal QRS, normal QTc, no obvious ischemic ST elevation, T wave flattening in aVL, no prior to compare   RADIOLOGY On my independent interpretation, CT head without obvious intracranial hemorrhage   PROCEDURES:  Critical Care performed: No  Procedures   MEDICATIONS ORDERED IN  ED: Medications - No data to display   IMPRESSION / MDM / ASSESSMENT AND PLAN / ED COURSE  I reviewed the triage vital signs and the nursing notes.                              Differential diagnosis includes, but is not limited to, contusion, fracture, intracranial hemorrhage, orthostatic hypertension, arrhythmia, atypical ACS, no other infectious symptoms to suggest viral illness, UTI, pneumonia.  Labs, EKG, troponin, CT face, CT head.  Patient's presentation is most consistent with acute presentation with potential threat to life or bodily function.  Independent interpretation of labs and imaging below.  CT head and face without fracture or intracranial hemorrhage.  There is some swelling with hematoma to the left face.  Patient is able to ambulate, tolerate p.o.  Safe for outpatient management.  Considered but indication for inpatient admission at this time.  Shared decision making done with patient and she is agreeable plan for discharge, encouraged hydration outpatient, will put in a referral for her for cardiology to schedule follow-up outpatient.  Will discharge with strict return precautions.  The patient is on the cardiac monitor to evaluate for evidence of arrhythmia and/or significant heart rate changes.   Clinical Course as of 08/15/23 1734  Sun Aug 15, 2023  1648 Independent review of labs, ECG and troponin not elevated, electrolytes not severely deranged, mild leukocytosis but she has no infectious symptoms.  Her H&H is stable. [TT]  1648 CT Maxillofacial Wo Contrast IMPRESSION: 1. No acute brain injury. 2. No acute facial bone fracture. 3. Soft tissue swelling over the anterior left mid face/infraorbital region with hematoma over the anterolateral mid to lower left face measuring 0.9 x 2.2 cm.   [TT]    Clinical Course User Index [TT] Waymond Lorelle Cummins, MD     FINAL CLINICAL IMPRESSION(S) / ED DIAGNOSES   Final diagnoses:  Syncope, unspecified syncope type   Periorbital ecchymosis of left eye, initial encounter  Fall, initial encounter  Facial pain     Rx / DC Orders   ED Discharge Orders          Ordered    Ambulatory referral to Cardiology       Comments: If you have not heard from the Cardiology office within the next 72 hours please call [phone].   08/15/23 1732  Note:  This document was prepared using Dragon voice recognition software and may include unintentional dictation errors.    Waymond Lorelle Cummins, MD 08/15/23 [phone]

## 2023-08-19 NOTE — Progress Notes (Signed)
 Cardiology Office Note   Date:  08/20/2023  ID:  Carolyn Clements, Carolyn Clements 04-16-1970, MRN [id number] PCP: Cyrus Selinda Moose, PA-C  [facility] Providers Cardiologist:  None Cardiology APP:  Gerard Frederick, NP     History of Present Illness Carolyn Clements is a 53 y.o. female with a past medical history of GERD, GAD, bipolar disorder, breast cancer, heart murmur, migraines, history of syncope, iron deficiency anemia, MVP (nonrheumatic), dysmenorrhea, who is here today to establish care.  Patient was recently evaluated in the [facility] emergency department 08/15/2023 with a facial injury status post fall with LOC.  States that occurred around 5 AM.  She got up went to the bathroom, passed out, and then hit her face.  States that this was typical for her and that it happened 5 times in the past 8 years.  She stated that she feels lightheaded and gets tunnel vision and then passes out.  Denies any visual changes.  Does have a history of orthostatic hypotension but states that that has happened in the past.  She denied any other associated symptoms of chest pain, shortness of breath slurred speech or facial droop.  States that she has been seen by cardiology in the past and had a 30-day event monitor stated they did not know why she was passing out.  Last cardiology follow-up was prepandemic.  She had admission in June 2025 where she underwent a hysterectomy and D&C as well as an endometrial ablation that was terminated due to cavity length.  When she was seen for follow-up by OB in early July she was not having any pain.  Vital signs were stable.  Labs were unrevealing.  CT of the head and face without fracture or intracranial hemorrhage.  There was some swelling with hematoma to her left face.  She was considered stable and discharged from the emergency department with follow-up with cardiology.  She presents to clinic today after recent visit to the emergency department after syncope with  collapse.  She states she has probably had 3 of the significant syncopal episodes in the last 8 years with injury and concussions.  She states is always when she is either on her way to the bathroom in the night or on the way back to bed.  She does take sleeping medications and is wondering if that is about the time that her sleep and medications are wearing out.  She stated that she did not know if it was starting back as she had completed radiation in April 2024.  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  She states that today she does suffer from fatigue that has been going.  She denies any chest discomfort or shortness of breath stating that she typically has extremely active.  She states that she had previously had an echocardiogram and was told that she had MVP and she wore a 30-day Holter monitor that was unrevealing other than she found she was allergic to the adhesive from the monitor.  ROS: 10 point review of systems has been reviewed and considered negative except ones been listed in HPI  Studies Reviewed EKG Interpretation Date/Time:  Friday August 20 2023 09:17:31 EDT Ventricular Rate:  63 PR Interval:  148 QRS Duration:  90 QT Interval:  416 QTC Calculation: 425 R Axis:  47  Text Interpretation: Normal sinus rhythm Possible Inferior infarct , age undetermined When compared with ECG of 15-Aug-2023 14:19, No significant change since last tracing Confirmed by Gerard Frederick ([id number]) on 08/20/2023 10:45:57 AM    2D echo ([facility]) 07/24/2014 INTERPRETATION  NORMAL LEFT VENTRICULAR SYSTOLIC FUNCTION WITH AN ESTIMATED EF = 50 %  NORMAL RIGHT VENTRICULAR SYSTOLIC FUNCTION  MODERATE-TO-SEVERE MITRAL VALVE REGURGITATION WITH MILD MITRAL PROLAPSE  MILD TRICUSPID VALVE INSUFFICIENCY  NO VALVULAR STENOSIS  MILD LV  ENLARGEMENT  MILD LA ENLARGEMENT   Risk Assessment/Calculations         Physical Exam VS:  BP 117/83 (BP Location: Left Arm, Patient Position: Sitting, Cuff Size: Normal)   Pulse 63   Ht 5' 2 (1.575 m)   Wt 144 lb 6.4 oz (65.5 kg)   LMP  (Approximate)   SpO2 98%   BMI 26.41 kg/m   Orthostatic VS for the past 24 hrs (Last 3 readings):  BP- Lying Pulse- Lying BP- Sitting Pulse- Sitting BP- Standing at 0 minutes Pulse- Standing at 0 minutes BP- Standing at 3 minutes Pulse- Standing at 3 minutes  08/20/23 0932 123/79 64 123/81 64 117/73 63 112/74 70  08/20/23 0926 123/79 64 123/81 64 117/73 63 -- 70      Wt Readings from Last 3 Encounters:  08/20/23 144 lb 6.4 oz (65.5 kg)  08/15/23 141 lb (64 kg)  07/01/23 143 lb (64.9 kg)    GEN: Well nourished, well developed in no acute distress NECK: No JVD; No carotid bruits CARDIAC: RRR, I/VI systolic murmur with a click noted in the apex area, without rubs or gallops RESPIRATORY:  Clear to auscultation without rales, wheezing or rhonchi  ABDOMEN: Soft, non-tender, non-distended EXTREMITIES:  No edema; No deformity   ASSESSMENT AND PLAN Syncope with collapse, patient has a longstanding history of multiple syncopal episodes in the past with injury and concussion. (3 significant in the last 8 years but several less significant over the time span). This last episode she had gotten up to go to the restroom and fell in the bathroom.  She has no idea how long she lost this on the floor when she has at home because it is typically unwitnessed.  Prior echocardiogram revealed mild MVP and moderate to severe MR.  This study was completed in 2016.  She has been scheduled for an updated study with further conversation about referral for valve replacement/repair.  Depending on the findings of the echocardiogram.  We are unable to place her on a ZIO monitor due to allergies to the adhesive.  With multiple syncopal episodes and nothing being found she has been  referred to EP for possible ILR.  With a history of breast cancer she does have a history of lymphedema on the left side and continues to wear compression bras.  Discussed that loop recorder's do have some swelling but no immediate direct correlation with lymphedema is known but would recommend further discussion with EP when discussing possible implantation.  Abnormal EKG with possible inferior infarct with sinus rhythm with a rate of 63.  With recurrent syncopal episodes and no ischemic evaluation and EKG findings today she has been scheduled for a coronary CTA to rule out any ischemic causes.  History of breast cancer where she finished treatment in April 2024.  She has been scheduled for an updated echocardiogram to reevaluate function and structure.       Dispo: Patient to return to clinic to see MD/APP once testing is completed or  sooner if needed for further evaluation.  She was also advised during her visit that according to the [place]  Department of Transportation that she should not be driving after a syncopal episode for 6 months until the cause is found and treated or if she has not had another event in the 6 months time span.  Signed, Adler Alton, NP

## 2023-08-20 ENCOUNTER — Encounter: Payer: Self-pay | Admitting: Cardiology

## 2023-08-20 ENCOUNTER — Ambulatory Visit: Attending: Cardiology | Admitting: Cardiology

## 2023-08-20 VITALS — BP 117/83 | HR 63 | Ht 62.0 in | Wt 144.4 lb

## 2023-08-20 DIAGNOSIS — Z8679 Personal history of other diseases of the circulatory system: Secondary | ICD-10-CM | POA: Diagnosis not present

## 2023-08-20 DIAGNOSIS — Z853 Personal history of malignant neoplasm of breast: Secondary | ICD-10-CM | POA: Diagnosis not present

## 2023-08-20 DIAGNOSIS — R55 Syncope and collapse: Secondary | ICD-10-CM | POA: Diagnosis not present

## 2023-08-20 DIAGNOSIS — R9431 Abnormal electrocardiogram [ECG] [EKG]: Secondary | ICD-10-CM

## 2023-08-20 MED ORDER — METOPROLOL TARTRATE 50 MG PO TABS: 50.0000 mg | ORAL_TABLET | Freq: Once | ORAL | 0 refills | Status: DC

## 2023-08-20 NOTE — Patient Instructions (Addendum)
 Ambulatory referral to EP for ILR  Medication Instructions:  Your physician recommends that you continue on your current medications as directed. Please refer to the Current Medication list given to you today.   *If you need a refill on your cardiac medications before your next appointment, please call your pharmacy*  Lab Work: Your provider would like for you to have following labs drawn today BMP.   If you have labs (blood work) drawn today and your tests are completely normal, you will receive your results only by: MyChart Message (if you have MyChart) OR A paper copy in the mail If you have any lab test that is abnormal or we need to change your treatment, we will call you to review the results.  Testing/Procedures: Your physician has requested that you have an echocardiogram. Echocardiography is a painless test that uses sound waves to create images of your heart. It provides your doctor with information about the size and shape of your heart and how well your heart's chambers and valves are working.   You may receive an ultrasound enhancing agent through an IV if needed to better visualize your heart during the echo. This procedure takes approximately one hour.  There are no restrictions for this procedure.  This will take place at [address] [facility] [address], [place] [address]  Please note: We ask at that you not bring children with you during ultrasound (echo/ vascular) testing. Due to room size and safety concerns, children are not allowed in the ultrasound rooms during exams. Our front office staff cannot provide observation of children in our lobby area while testing is being conducted. An adult accompanying a patient to their appointment will only be allowed in the ultrasound room at the discretion of the ultrasound technician under special circumstances. We apologize for any inconvenience.     Your cardiac CT will be scheduled at one of the below locations:    [facility] [address] [place], [place] [address] [phone]  If scheduled at [facility], please arrive to the Heart and Vascular Center 15 mins early for check-in and test prep.  There is spacious parking and easy access to the radiology department from the [facility] entrance. Please enter here and check-in with the desk attendant.   Please follow these instructions carefully (unless otherwise directed):  An IV will be required for this test and Nitroglycerin will be given.  Hold all erectile dysfunction medications at least 3 days (72 hrs) prior to test. (Ie viagra, cialis, sildenafil, tadalafil, etc)   On the Night Before the Test: Be sure to Drink plenty of water . Do not consume any caffeinated/decaffeinated beverages or chocolate 12 hours prior to your test. Do not take any antihistamines 12 hours prior to your test.  On the Day of the Test: Drink plenty of water  until 1 hour prior to the test. Do not eat any food 1 hour prior to test. You may take your regular medications prior to the test.  Take metoprolol  (Lopressor ) two hours prior to test. If you take Furosemide/Hydrochlorothiazide/Spironolactone/Chlorthalidone, please HOLD on the morning of the test. Patients who wear a continuous glucose monitor MUST remove the device prior to scanning. FEMALES- please wear underwire-free bra if available, avoid dresses & tight clothing       After the Test: Drink plenty of water . After receiving IV contrast, you may experience a mild flushed feeling. This is normal. On occasion, you may experience  a mild rash up to 24 hours after the test. This is not dangerous. If this occurs, you can take Benadryl  25 mg, Zyrtec, Claritin, or Allegra and increase your fluid intake. (Patients taking Tikosyn should avoid Benadryl , and may take Zyrtec, Claritin, or Allegra) If you experience trouble breathing, this can be serious. If it  is severe call 911 IMMEDIATELY. If it is mild, please call our office.  We will call to schedule your test 2-4 weeks out understanding that some insurance companies will need an authorization prior to the service being performed.   For more information and frequently asked questions, please visit our website : [email or web address]  For non-scheduling related questions, please contact the cardiac imaging nurse navigator should you have any questions/concerns: Cardiac Imaging Nurse Navigators Direct Office Dial: [phone]   For scheduling needs, including cancellations and rescheduling, please call [place], [phone].   Follow-Up: At [facility], you and your health needs are our priority.  As part of our continuing mission to provide you with exceptional heart care, our providers are all part of one team.  This team includes your primary Cardiologist (physician) and Advanced Practice Providers or APPs (Physician Assistants and Nurse Practitioners) who all work together to provide you with the care you need, when you need it.  Your next appointment:   1 - 2 month(s)  Provider:   Tylene Lunch, NP

## 2023-08-21 ENCOUNTER — Other Ambulatory Visit: Payer: Self-pay | Admitting: Cardiology

## 2023-08-21 LAB — BASIC METABOLIC PANEL WITH GFR
BUN/Creatinine Ratio: 21 (ref 9–23)
BUN: 18 mg/dL (ref 6–24)
CO2: 24 mmol/L (ref 20–29)
Calcium: 10.1 mg/dL (ref 8.7–10.2)
Chloride: 101 mmol/L (ref 96–106)
Creatinine, Ser: 0.84 mg/dL (ref 0.57–1.00)
Glucose: 90 mg/dL (ref 70–99)
Potassium: 4.7 mmol/L (ref 3.5–5.2)
Sodium: 139 mmol/L (ref 134–144)
eGFR: 85 mL/min/1.73 (ref >59)

## 2023-08-22 ENCOUNTER — Other Ambulatory Visit: Payer: Self-pay | Admitting: Cardiology

## 2023-08-23 ENCOUNTER — Other Ambulatory Visit: Payer: Self-pay | Admitting: Cardiology

## 2023-08-23 ENCOUNTER — Ambulatory Visit: Payer: Self-pay | Admitting: Cardiology

## 2023-08-23 NOTE — Progress Notes (Signed)
 Kidney function and electrolytes are stable.  Continue with previously scheduled testing as ordered.

## 2023-08-24 ENCOUNTER — Other Ambulatory Visit: Payer: Self-pay | Admitting: Cardiology

## 2023-08-25 ENCOUNTER — Other Ambulatory Visit: Payer: Self-pay | Admitting: Cardiology

## 2023-09-08 ENCOUNTER — Encounter (HOSPITAL_COMMUNITY): Payer: Self-pay

## 2023-09-13 ENCOUNTER — Ambulatory Visit

## 2023-09-13 ENCOUNTER — Ambulatory Visit (HOSPITAL_COMMUNITY)
Admission: RE | Admit: 2023-09-13 | Discharge: 2023-09-13 | Disposition: A | Discharge status: Home / Self Care | Source: Ambulatory Visit | Attending: Cardiology | Admitting: Cardiology

## 2023-09-13 DIAGNOSIS — Z8679 Personal history of other diseases of the circulatory system: Secondary | ICD-10-CM

## 2023-09-13 DIAGNOSIS — I2510 Atherosclerotic heart disease of native coronary artery without angina pectoris: Secondary | ICD-10-CM | POA: Insufficient documentation

## 2023-09-13 DIAGNOSIS — R9431 Abnormal electrocardiogram [ECG] [EKG]: Secondary | ICD-10-CM

## 2023-09-13 DIAGNOSIS — R079 Chest pain, unspecified: Secondary | ICD-10-CM | POA: Diagnosis present

## 2023-09-13 MED ORDER — IOHEXOL 350 MG/ML SOLN
100.0000 mL | Freq: Once | INTRAVENOUS | Status: AC | PRN
  Administered 2023-09-13: 100 mL via INTRAVENOUS

## 2023-09-13 MED ORDER — NITROGLYCERIN 0.4 MG SL SUBL
0.8000 mg | SUBLINGUAL_TABLET | Freq: Once | SUBLINGUAL | Status: AC
  Administered 2023-09-13: 0.8 mg via SUBLINGUAL

## 2023-09-14 NOTE — Progress Notes (Signed)
 Coronary calcium score of 39.5.  Considered minimal nonobstructive coronary artery disease.  Recommendation is with preventative therapy and risk factor modification keeping bad cholesterol or LDL less than 70.  I recommend starting aspirin 81 mg daily.  Over read of the CT of the chest by radiology continues to be pending.

## 2023-09-23 ENCOUNTER — Ambulatory Visit: Attending: Cardiology

## 2023-09-23 DIAGNOSIS — R55 Syncope and collapse: Secondary | ICD-10-CM | POA: Diagnosis not present

## 2023-09-23 DIAGNOSIS — Z8679 Personal history of other diseases of the circulatory system: Secondary | ICD-10-CM | POA: Diagnosis not present

## 2023-09-23 DIAGNOSIS — I341 Nonrheumatic mitral (valve) prolapse: Secondary | ICD-10-CM | POA: Diagnosis not present

## 2023-09-23 LAB — ECHOCARDIOGRAM COMPLETE
AR max vel: 2.26 cm2
AV Area VTI: 2.64 cm2
AV Area mean vel: 2.28 cm2
AV Mean grad: 5 mmHg
AV Peak grad: 9.2 mmHg
Ao pk vel: 1.52 m/s
Area-P 1/2: 4.29 cm2
MV M vel: 4.27 m/s
MV Peak grad: 72.9 mmHg
Radius: 0.7 cm
S' Lateral: 4.1 cm

## 2023-09-24 NOTE — Progress Notes (Signed)
 Heart squeeze is noted to be 50-55%, this is the low normal function, there are no wall motion abnormalities that have been noted.  There is mild to moderate leakage in the mitral valve without mitral valve prolapse noticed, and mild leakage in the tricuspid valve.  No findings to suggest current symptoms overall reassuring study.  Continue to monitor with surveillance studies every 2 to 3 years.

## 2023-09-29 IMAGING — MG MM DIGITAL SCREENING BILAT W/ TOMO AND CAD
6 of 10 series · 6 of 30 positions shown · non-contrast
Comparison: None.

CLINICAL DATA: Screening.

EXAM:
DIGITAL SCREENING BILATERAL MAMMOGRAM WITH TOMOSYNTHESIS AND CAD
TECHNIQUE: Bilateral screening digital craniocaudal and mediolateral oblique
mammograms were obtained. Bilateral screening digital breast
tomosynthesis was performed. The images were evaluated with
computer-aided detection.

[R CC synth-2D (1 of 2)]
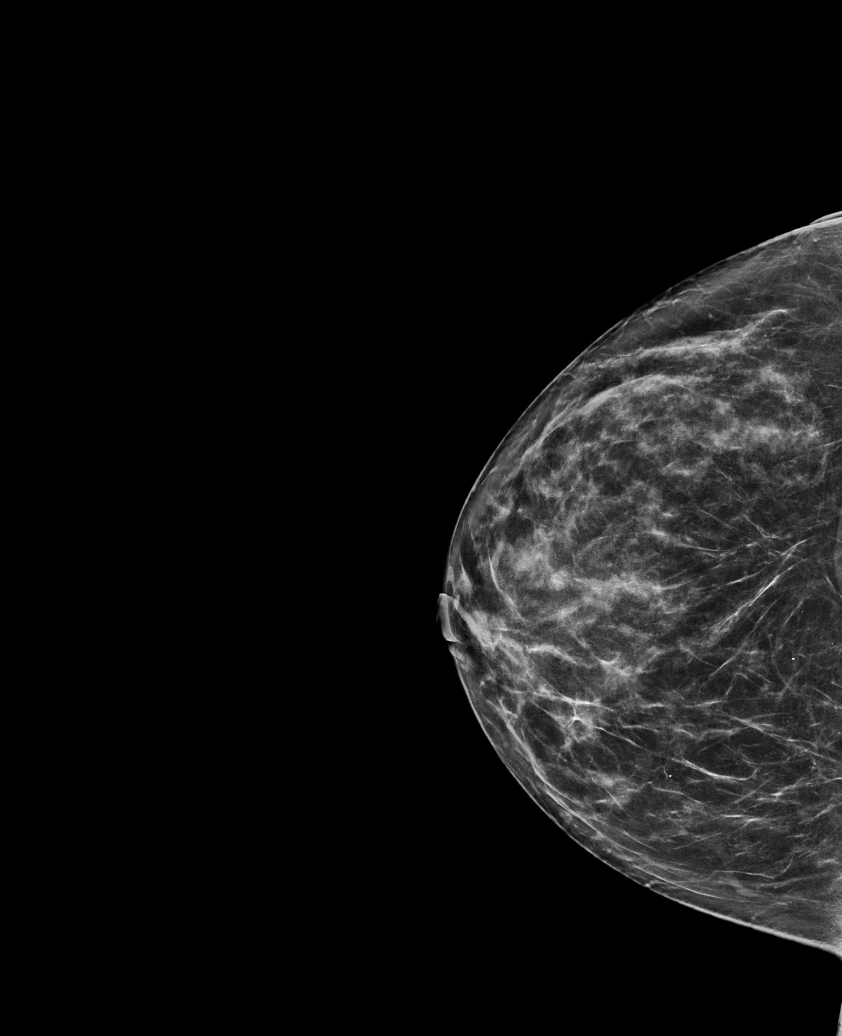

[R CC synth-2D (2 of 2)]
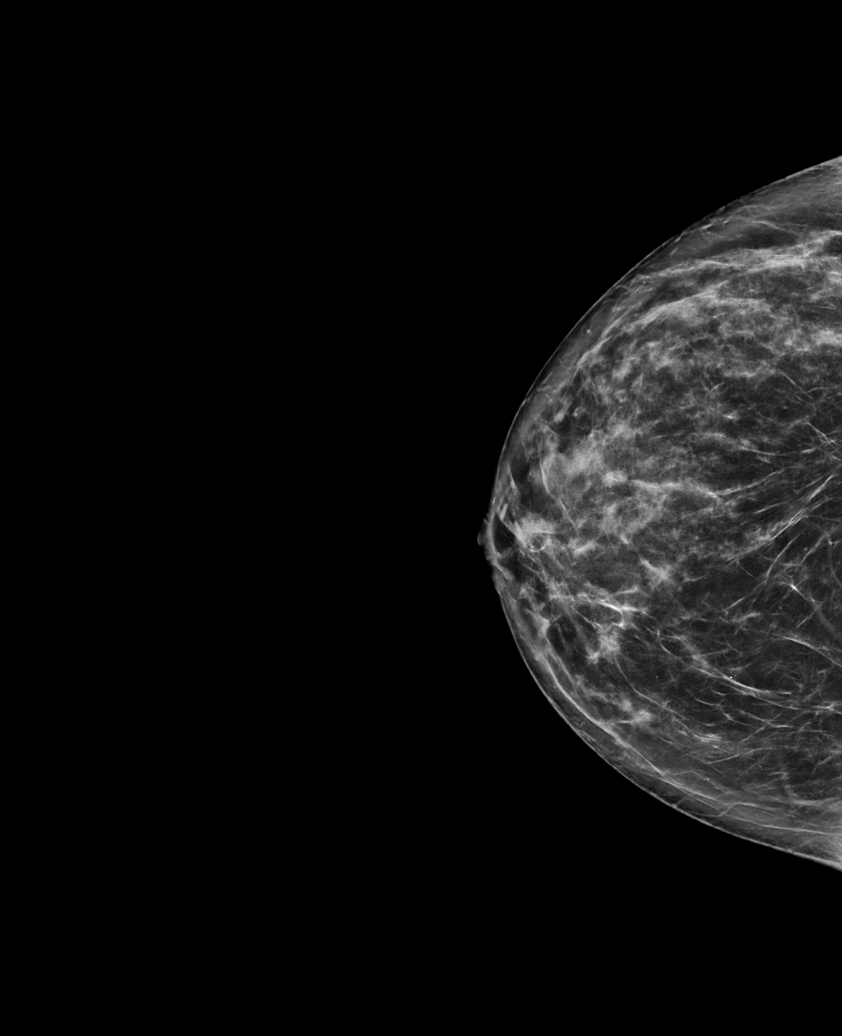

[R MLO synth-2D]
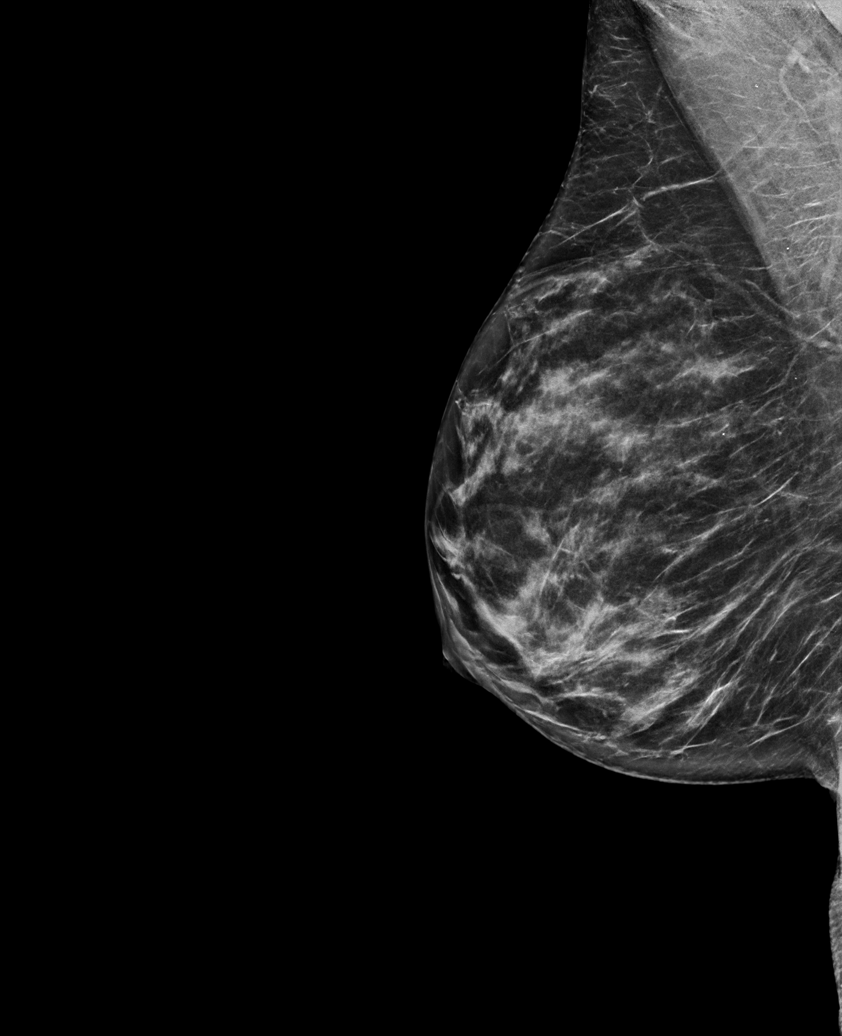

[L MLO synth-2D]
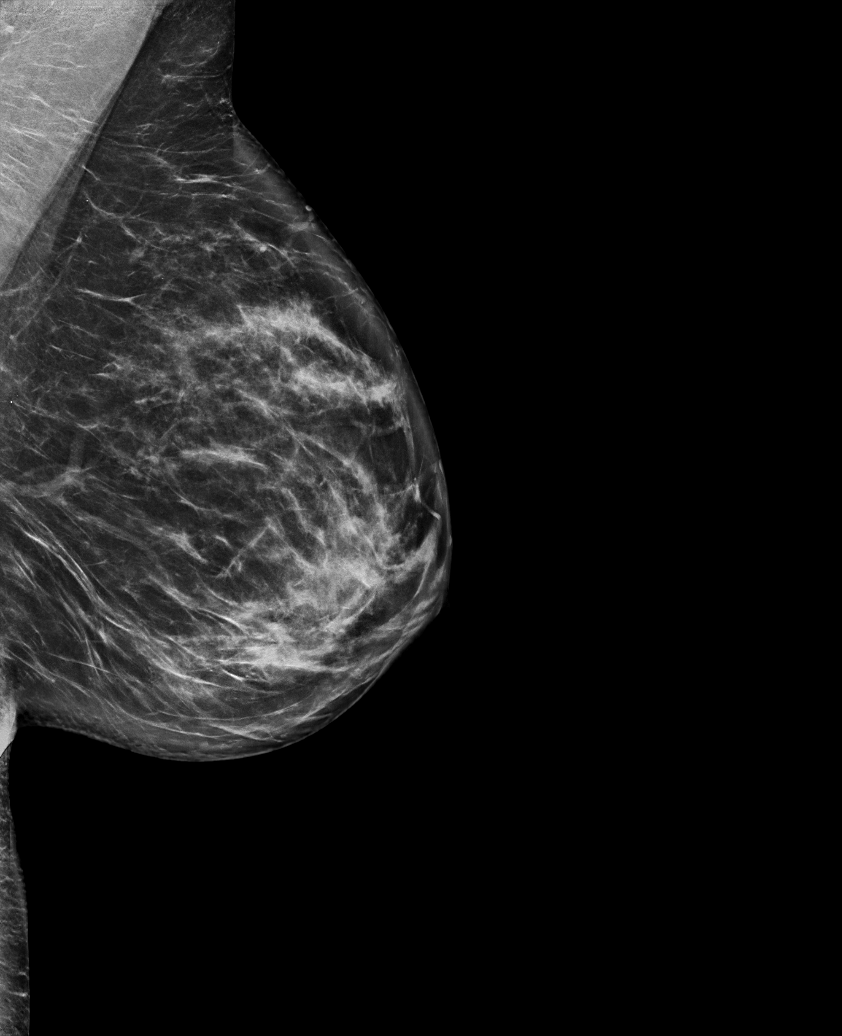

[L CC synth-2D]
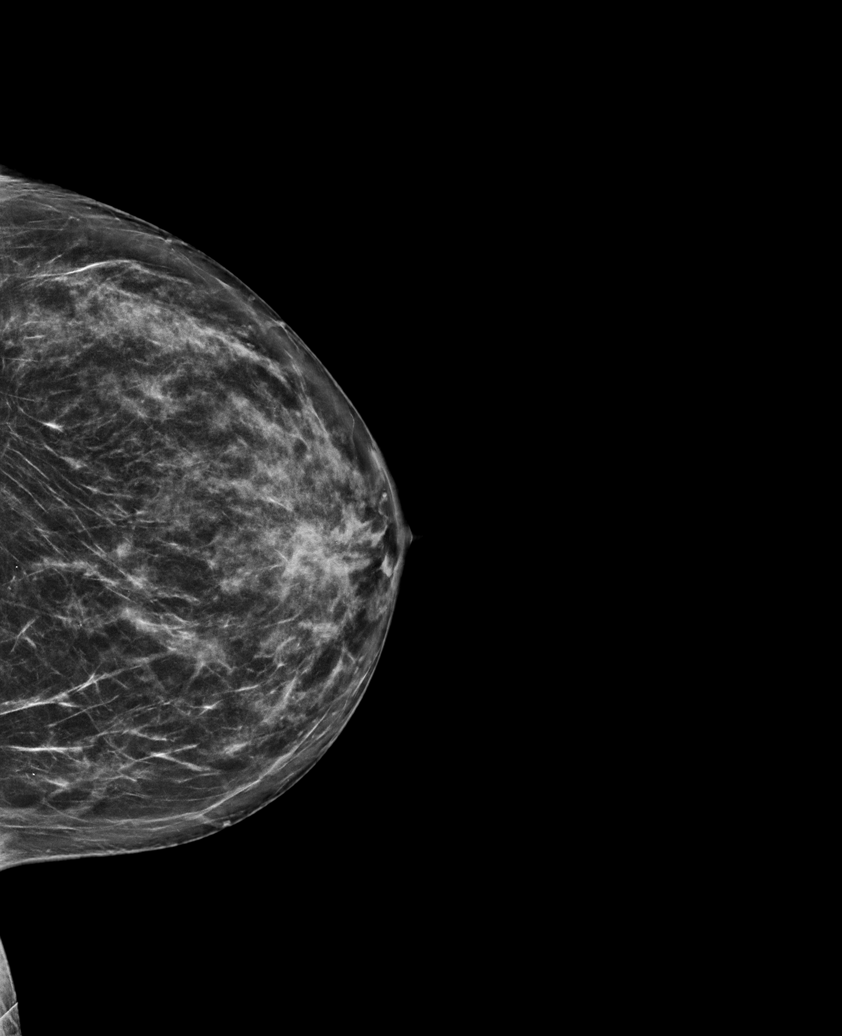

[R MLO tomo · tomo slice 35/69.0]
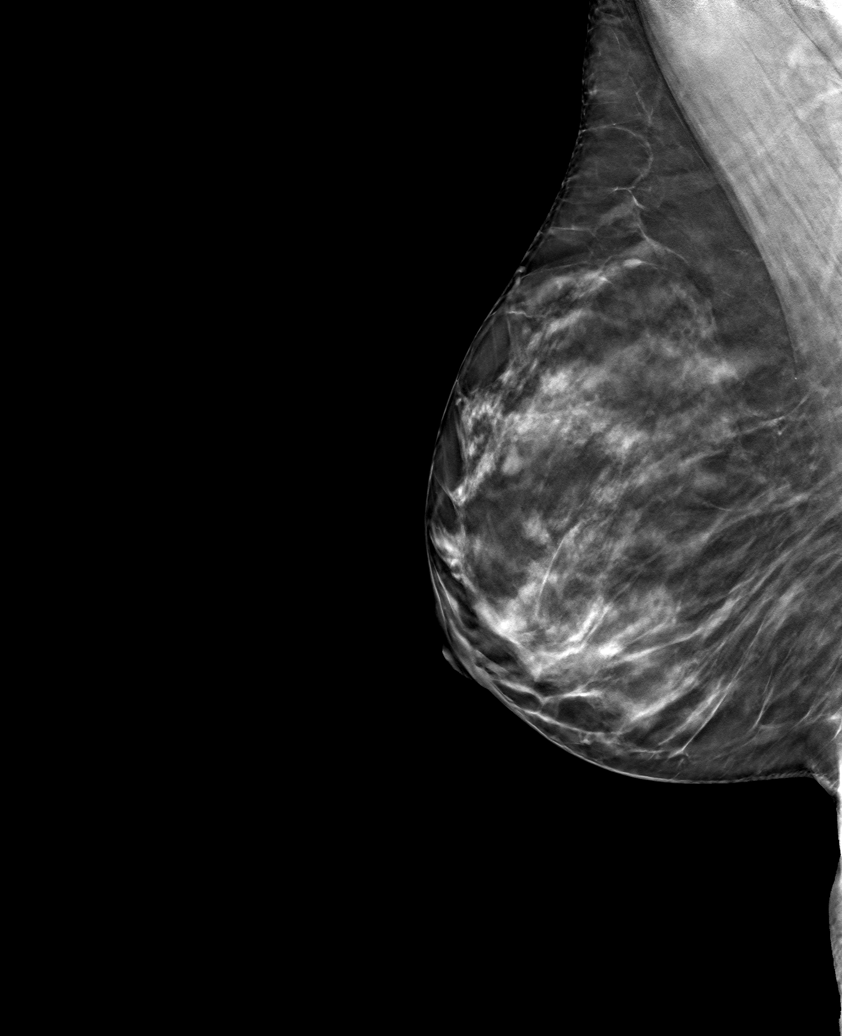

[6 of 30 positions shown; findings below may reference images not displayed]

ACR Breast Density Category c: The breast tissue is heterogeneously
dense, which may obscure small masses.
FINDINGS: In the right breast a possible mass requires further evaluation.

In the left breast a possible asymmetry requires further evaluation.
IMPRESSION: Further evaluation is suggested for possible mass in the right
breast.

Further evaluation is suggested for possible asymmetry in the left
breast.

RECOMMENDATION:
Diagnostic mammogram and possibly ultrasound of both breasts.
(Code:[id number])

The patient will be contacted regarding the findings, and additional
imaging will be scheduled.

BI-RADS CATEGORY  0: Incomplete. Need additional imaging evaluation
and/or prior mammograms for comparison.

## 2023-10-13 ENCOUNTER — Ambulatory Visit: Admitting: Cardiology

## 2023-10-20 ENCOUNTER — Encounter: Payer: Self-pay | Admitting: Cardiology

## 2023-10-20 ENCOUNTER — Ambulatory Visit: Attending: Cardiology | Admitting: Cardiology

## 2023-10-20 VITALS — BP 108/70 | HR 72 | Ht 62.0 in | Wt 144.8 lb

## 2023-10-20 DIAGNOSIS — G4730 Sleep apnea, unspecified: Secondary | ICD-10-CM | POA: Diagnosis not present

## 2023-10-20 DIAGNOSIS — I340 Nonrheumatic mitral (valve) insufficiency: Secondary | ICD-10-CM

## 2023-10-20 DIAGNOSIS — G43909 Migraine, unspecified, not intractable, without status migrainosus: Secondary | ICD-10-CM

## 2023-10-20 DIAGNOSIS — Z853 Personal history of malignant neoplasm of breast: Secondary | ICD-10-CM

## 2023-10-20 DIAGNOSIS — R55 Syncope and collapse: Secondary | ICD-10-CM

## 2023-10-20 DIAGNOSIS — I2510 Atherosclerotic heart disease of native coronary artery without angina pectoris: Secondary | ICD-10-CM

## 2023-10-20 NOTE — Progress Notes (Signed)
 Cardiology Office Note   Date:  10/20/2023  ID:  [name], [name] 1970-03-20, MRN [id number] PCP: Cyrus Selinda Moose, PA-C  [facility] Providers Cardiologist:  None Cardiology APP:  Gerard Frederick, NP  Electrophysiologist:  OLE ONEIDA HOLTS, MD     History of Present Illness Carolyn Clements is a 53 y.o. female with a past medical history of syncope with collapse, GERD, GAD, bipolar disorder, breast cancer, heart murmur, migraines, iron deficiency anemia, MVP (nonrheumatic), dysmenorrhea, who is here today to follow-up.   Patient was recently evaluated in the [facility] emergency department 08/15/2023 with a facial injury status post fall with LOC. States that occurred around 5 AM. She got up went to the bathroom, passed out, and then hit her face. States that this was typical for her and that it happened 5 times in the past 8 years. She stated that she feels lightheaded and gets tunnel vision and then passes out. Denies any visual changes. Does have a history of orthostatic hypotension but states that that has happened in the past. She denied any other associated symptoms of chest pain, shortness of breath slurred speech or facial droop. States that she has been seen by cardiology in the past and had a 30-day event monitor stated they did not know why she was passing out. Last cardiology follow-up was prepandemic. She had admission in June 2025 where she underwent a hysterectomy and D&C as well as an endometrial ablation that was terminated due to cavity length. When she was seen for follow-up by OB in early July she was not having any pain. Vital signs were stable. Labs were unrevealing. CT of the head and face without fracture or intracranial hemorrhage. There was some swelling with hematoma to her left face. She was considered stable and discharged from the emergency department with follow-up with cardiology.    She was last seen in clinic 08/20/2023 after recent visit to the  emergency department for syncope with collapse.  Patient started getting up to go to the bathroom, passed out, hit her face.  States that this is typical for her and happened approximately 5 times in the past 8 years.  She stated that she feels lightheaded and gets tunnel vision and passed out.  She denied any visual changes.  She does have a history of both static hypotension but states that at 7 in the past.  She denies any other associated symptoms.  States that previously cardiology in the past ablation a 30-day event monitor but did not know while she was passing out.  Last cardiology follow-up was prepandemic.  She had admission in June 2025 where she underwent hysterectomy and D&C as well as an endometrial ablation that was terminated due to cavity length.  When she was seen for follow-up by OB in early July she was not having any pain.  Vital signs were stable.  Labs were unrevealing.  CT of the head and face were without fracture or intracranial hemorrhage.  There was some swelling hematoma to the left side of her face but she was considered stable for discharge.  She said at that time with her episode that she had recently had she was on her way to the bathroom in the night on her way back to bed.  She states that she was taking sleeping medication was wondering if it was about the time she was asleep and the medication was wearing off.  She states she does not know if she had started back  as she would get lightheaded and tunnel vision before everything blocks that she would need this fall backwards.  She continues to suffer from fatigue and fatigue that was ongoing since the episode.  She was scheduled for an updated echocardiogram to evaluate her MVP and moderate to severe MR as well as a referral to EP for possible implantation of a loop recorder.  She was also scheduled for coronary CTA to rule out any ischemic cause.  She returns to clinic today stating that overall from a cardiac perspective she  has been doing better.  She denies any current chest pain or shortness of breath, she has not had any recurrent syncopal or near syncopal episodes.  She has followed up with neurology and is undergoing treatment for migraines.  She did had outpatient ischemic workup done with coronary CTA and an echocardiogram to look for functional structural abnormalities.  She is still awaiting follow-up with the EP for possible ILR.  She states that she has been compliant with her current medication regimen.  She states that she does not sleep or she does not sleep well she wakes she does not feel rested, she is extremely tired throughout the day, she keeps a headache pretty much all day, she takes a large quantity of medications to try to fall asleep but is unable to stay asleep and believes that is playing a role as well.  She continues to try to stay active.  Denies any recent hospitalizations or visits to the emergency department  ROS: 10 point review of system has been reviewed and considered negative except what is listed in the HPI  Studies Reviewed     2d echo 09/23/2023 1. Left ventricular ejection fraction, by estimation, is 50 to 55%. The  left ventricle has low normal function. The left ventricle has no regional  wall motion abnormalities. The left ventricular internal cavity size was  mildly dilated. Left ventricular  diastolic parameters were normal. The average left ventricular global  longitudinal strain is -22.5 %. The global longitudinal strain is normal.   2. Right ventricular systolic function is normal. The right ventricular  size is normal.   3. Left atrial size was mild to moderately dilated.   4. The mitral valve is mildly thickened and moderately calcified. Mild to  moderate mitral valve regurgitation. No evidence of mitral stenosis.   5. The tricuspid valve is myxomatous.   6. The aortic valve is normal in structure. Aortic valve regurgitation is  not visualized. No aortic stenosis is  present.   7. The inferior vena cava is normal in size with greater than 50%  respiratory variability, suggesting right atrial pressure of 3 mmHg.   2D echo ([facility]) 07/24/2014 INTERPRETATION  NORMAL LEFT VENTRICULAR SYSTOLIC FUNCTION WITH AN ESTIMATED EF = 50 %  NORMAL RIGHT VENTRICULAR SYSTOLIC FUNCTION  MODERATE-TO-SEVERE MITRAL VALVE REGURGITATION WITH MILD MITRAL PROLAPSE  MILD TRICUSPID VALVE INSUFFICIENCY  NO VALVULAR STENOSIS  MILD LV ENLARGEMENT  MILD LA ENLARGEMENT   Risk Assessment/Calculations       DOB: 09-Nov-1970       Height:  5' 2 (1.575 m)     Weight: 144 lb 12.8 oz (65.7 kg)  BMI: Body mass index is 26.48 kg/m.    STOP-BANG RISK ASSESSMENT       10/20/2023    4:16 PM  STOP-BANG  Do you snore loudly? No  Do you often feel tired, fatigued, or sleepy during the daytime? Yes  Has anyone observed you stop breathing  during sleep? Yes  Do you have (or are you being treated for) high blood pressure? No  Recent BMI (Calculated) 26.48  Is BMI greater than 35 kg/m2? 0=No  Age older than 51 years old? 0=No  Has large neck size > 40 cm (15.7 in, large female shirt size, large female collar size > 16) No  Gender - Female 0=No  STOP-Bang Total Score 2      If STOP-BANG Score >=3 OR two clinical symptoms - patient qualifies for WatchPAT (CPT 95800)      Sleep study ordered due to two (2) of the following clinical symptoms/diagnoses:  Excessive daytime sleepiness G47.10  Gastroesophageal reflux K21.9  Nocturia R35.1  Morning Headaches G44.221  Difficulty concentrating R41.840  Memory problems or poor judgment G31.84  Personality changes or irritability R45.4  Loud snoring R06.83  Depression F32.9  Unrefreshed by sleep G47.8  Impotence N52.9  History of high blood pressure R03.0  Insomnia G47.00  Sleep Disordered Breathing or Sleep Apnea ICD G47.33         STOP-Bang Score:  2      Physical Exam VS:  BP 108/70   Pulse 72   Ht 5' 2 (1.575 m)   Wt 144 lb 12.8  oz (65.7 kg)   SpO2 97%   BMI 26.48 kg/m        Wt Readings from Last 3 Encounters:  10/20/23 144 lb 12.8 oz (65.7 kg)  08/20/23 144 lb 6.4 oz (65.5 kg)  08/15/23 141 lb (64 kg)    GEN: Well nourished, well developed in no acute distress NECK: No JVD; No carotid bruits CARDIAC: RRR, I/VI systolic murmur with a mid systolic click noted at the apex area without rubs or gallops RESPIRATORY:  Clear to auscultation without rales, wheezing or rhonchi  ABDOMEN: Soft, non-tender, non-distended EXTREMITIES:  No edema; No deformity   ASSESSMENT AND PLAN Syncope with collapse with longstanding history of multiple syncopal episodes in the past with injury and concussion.  She has not had any reoccurrence since her last visit to clinic.  She has followed up with neurology and has been started on medications for migraines.  She is still awaiting appointment with EP.  Recent coronary CTA revealed a calcium score of 39.5.  Echo was also completed which revealed an LVEF of 50-55% which was low normal function with no wall motion abnormalities, mild to moderate leakage of the mitral valve without MVP noted and mild leakage noted in the tricuspid valve.  No causative factors to suggest syncope.  Nonobstructive coronary artery disease with recent coronary CTA completed with a calcium score of 39.5 which is in the 95th percentile for age and sex matched controls with less than 25 presented in the proximal LAD it was recommended to consider nonatherosclerotic causes of chest pain and consider preventative therapy and risk factor modification.  Repeat lipid panel for further risk stratification on return.  Mitral valve insufficiency with mild to moderate mitral regurgitation noted on recent echocardiogram.  Will continue to monitor with surveillance echocardiograms.  History of breast cancer where she finished treatment in April 2024.  Ongoing management per oncology.  Chronic migraines which she has recently  been evaluated by neurology.  Headaches have been ongoing since childhood.  She is continued on Ajovy and is waiting for insurance approval for Nurtec.  Ongoing management per neurology.  Sleep altered breathing with a STOP-BANG score of only 2 but she has excessive daytime sleepiness, morning headaches, unrefreshed by sleep, and a  history of sleep disordered breathing with dropping her oxygen saturations into the low 80s during the night without treatment in the past.  She is being ordered a WatchPAT to determine if she does have sleep apnea.  There was also concerns for dilated left atrium noted on echocardiogram and that is concerning later to set her up for arrhythmias like atrial fibrillation and atrial flutter that can also be triggered by sleep apnea as well.       Dispo: Patient to return to clinic to see MD/APP in 3 months or sooner if needed for further evaluation.  Signed, Carolyn Bramer, NP

## 2023-10-20 NOTE — Patient Instructions (Signed)
 Medication Instructions:  Your physician recommends that you continue on your current medications as directed. Please refer to the Current Medication list given to you today.   *If you need a refill on your cardiac medications before your next appointment, please call your pharmacy*  Lab Work: No labs ordered today  If you have labs (blood work) drawn today and your tests are completely normal, you will receive your results only by: MyChart Message (if you have MyChart) OR A paper copy in the mail If you have any lab test that is abnormal or we need to change your treatment, we will call you to review the results.  Testing/[email or web address] Patient agreement reviewed and signed on 10/20/2023. Provide a copy to the patient and send the original to HIM for scanning. STOP-BANG score documented in patient's chart (can document in an existing OV encounter if captured at time of visit, or add STOPBANG SmartPhrase to this encounter).  Ensure [facility] order [phone]) is entered as future and signed. Route encounter to CV DIV SLEEP STUDIES pool.  Instructions for Sleep Team: Obtain prior authorization (use BURLPAWATCHPAT SmartPhrase) and route encounter back to covering team member. Covering team member is responsible for calling patient to notify of PA approval and PIN, and to arrange for device pickup and education.   Follow-Up: At [facility], you and your health needs are our priority.  As part of our continuing mission to provide you with exceptional heart care, our providers are all part of one team.  This team includes your primary Cardiologist (physician) and Advanced Practice Providers or APPs (Physician Assistants and Nurse Practitioners) who all work together to provide you with the care you need, when you need it.  Your next appointment:   3 month(s)  Provider:   You may see one of the following Advanced Practice Providers on your designated Care Team:   Tylene Lunch,  NP

## 2023-10-26 ENCOUNTER — Institutional Professional Consult (permissible substitution): Admitting: Cardiology

## 2023-11-08 ENCOUNTER — Encounter: Payer: Self-pay | Admitting: *Deleted

## 2023-11-16 ENCOUNTER — Encounter: Payer: Self-pay | Admitting: Cardiology

## 2023-11-16 ENCOUNTER — Ambulatory Visit: Attending: Cardiology | Admitting: Cardiology

## 2023-11-16 VITALS — BP 100/68 | HR 72 | Ht 62.0 in | Wt 146.8 lb

## 2023-11-16 DIAGNOSIS — R55 Syncope and collapse: Secondary | ICD-10-CM | POA: Diagnosis not present

## 2023-11-16 NOTE — Patient Instructions (Signed)
 Medication Instructions:  Your physician recommends that you continue on your current medications as directed. Please refer to the Current Medication list given to you today.  *If you need a refill on your cardiac medications before your next appointment, please call your pharmacy*  Follow-Up: At [facility], you and your health needs are our priority.  As part of our continuing mission to provide you with exceptional heart care, our providers are all part of one team.  This team includes your primary Cardiologist (physician) and Advanced Practice Providers or APPs (Physician Assistants and Nurse Practitioners) who all work together to provide you with the care you need, when you need it.  Your next appointment:   Call back if you decide to proceed with a loop recorder implant

## 2023-11-16 NOTE — Progress Notes (Signed)
 Electrophysiology Office Note:   Date:  11/16/2023  ID:  Carolyn Clements, DOB 01-25-1970, MRN [id number]  Primary Cardiologist: None Electrophysiologist: Fonda Kitty, MD      History of Present Illness:   Carolyn Clements is a 53 y.o. female with h/o syncope with collapse, GERD, GAD, bipolar disorder, breast cancer, migraines, iron deficiency anemia, MVP (nonrheumatic), dysmenorrhea who is being seen today for evaluation for loop recorder implant.   Discussed the use of AI scribe software for clinical note transcription with the patient, who gave verbal consent to proceed.  History of Present Illness Carolyn Clements is a 53 year old female who presents with abnormal heart rhythms and passing out episodes. She was referred by a partner of Dr. Kitty for consideration of a loop recorder to evaluate passing out episodes.  She has experienced passing out episodes over the past ten years, with the most recent episode occurring six weeks ago. These episodes are sometimes preceded by dizziness or vision going black, allowing her to sit or lean on something to prevent a fall. However, she has had three to four severe episodes resulting in injury. These episodes typically occur when getting up to go to the bathroom at night.  A 30-day heart monitor test conducted a couple of years ago did not capture any episodes. She wears a watch that tracks her heart rate but notes it does not provide continuous monitoring like a loop recorder would.  She has a history of sleep disturbances, reporting that she has not slept well most of her life. She is on sleeping medications but only manages about three hours of sleep at the beginning of the night, followed by frequent tossing and turning. A past sleep study revealed oxygen desaturation to 87%, attributed to her tonsils, which were removed at age 89. However, this did not improve her sleep issues. She is awaiting a new sleep study.  She experiences migraines,  which have recently worsened.  She has lymphedema in the chest area but does not have breast implants. She has a history of dental issues related to jaw alignment, which may be affecting her sleep.  Review of systems complete and found to be negative unless listed in HPI.   EP Information / Studies Reviewed:    EKG is ordered today. Personal review as below.  EKG Interpretation Date/Time:  Tuesday November 16 2023 13:23:19 EST Ventricular Rate:  72 PR Interval:  142 QRS Duration:  88 QT Interval:  398 QTC Calculation: 435 R Axis:   21  Text Interpretation: Normal sinus rhythm Possible Lateral infarct , age undetermined When compared with ECG of 20-Aug-2023 09:17, No significant change since Confirmed by Kitty Fonda [phone] on 11/16/2023 1:35:16 PM   Echo 09/2023:   1. Left ventricular ejection fraction, by estimation, is 50 to 55%. The  left ventricle has low normal function. The left ventricle has no regional  wall motion abnormalities. The left ventricular internal cavity size was  mildly dilated. Left ventricular  diastolic parameters were normal. The average left ventricular global  longitudinal strain is -22.5 %. The global longitudinal strain is normal.   2. Right ventricular systolic function is normal. The right ventricular  size is normal.   3. Left atrial size was mild to moderately dilated.   4. The mitral valve is mildly thickened and moderately calcified. Mild to  moderate mitral valve regurgitation. No evidence of mitral stenosis.   5. The tricuspid valve is myxomatous.   6. The aortic valve  is normal in structure. Aortic valve regurgitation is  not visualized. No aortic stenosis is present.   7. The inferior vena cava is normal in size with greater than 50%  respiratory variability, suggesting right atrial pressure of 3 mmHg.   Coronary CTA 09/2023:  IMPRESSION: 1. Coronary calcium score of 39.5. This was 95th percentile for age-, sex, and race-matched  controls.   2. Normal coronary origin with right dominance.   3. Minimal calcified plaque in the proximal LAD (<25%).  Physical Exam:   VS:  BP 100/68 (BP Location: Left Arm, Patient Position: Sitting, Cuff Size: Normal)   Pulse 72   Ht 5' 2 (1.575 m)   Wt 146 lb 12.8 oz (66.6 kg)   SpO2 99%   BMI 26.85 kg/m    Wt Readings from Last 3 Encounters:  11/16/23 146 lb 12.8 oz (66.6 kg)  10/20/23 144 lb 12.8 oz (65.7 kg)  08/20/23 144 lb 6.4 oz (65.5 kg)     General: Well developed, in no acute distress.  Neck: No JVD.  Cardiac: Normal rate, regular rhythm.  Resp: Normal work of breathing.  Ext: No edema.  Neuro: No gross focal deficits.  Psych: Normal affect.   ASSESSMENT AND PLAN:    #Syncope: Sounds vagally mediated given it always occurs with using bathroom in the middle of the night - after awaking from sleep. Discussed role of loop recorder in monitoring for an arrhythmogenic or conduction disease related cause of syncope. Patient voiced understanding. Discussed risks and benefits of loop recorder implantation. Patient voiced understanding. She would like to pursue sleep study prior to ILR implant. She wears Apple Watch in the interim to monitor heart rates.  She will reach back out to us  if she elects to proceed.   Follow up with EP Team as needed.   Signed, Fonda Kitty, MD

## 2023-12-02 ENCOUNTER — Encounter: Payer: Self-pay | Admitting: Cardiology

## 2023-12-02 DIAGNOSIS — G4730 Sleep apnea, unspecified: Secondary | ICD-10-CM

## 2024-01-20 ENCOUNTER — Ambulatory Visit: Admitting: Cardiology

## 2024-01-24 ENCOUNTER — Telehealth: Payer: Self-pay | Admitting: Cardiology

## 2024-01-24 ENCOUNTER — Encounter: Payer: Self-pay | Admitting: Pulmonary Disease

## 2024-01-24 ENCOUNTER — Ambulatory Visit: Admitting: Pulmonary Disease

## 2024-01-24 VITALS — BP 117/77 | HR 63 | Temp 97.8°F | Ht 62.0 in | Wt 147.0 lb

## 2024-01-24 DIAGNOSIS — G4719 Other hypersomnia: Secondary | ICD-10-CM

## 2024-01-24 DIAGNOSIS — Z8659 Personal history of other mental and behavioral disorders: Secondary | ICD-10-CM | POA: Diagnosis not present

## 2024-01-24 DIAGNOSIS — F5104 Psychophysiologic insomnia: Secondary | ICD-10-CM | POA: Diagnosis not present

## 2024-01-24 DIAGNOSIS — G4710 Hypersomnia, unspecified: Secondary | ICD-10-CM

## 2024-01-24 NOTE — Telephone Encounter (Signed)
 Pt is requesting a callback from [facility] regarding her wanting to know when she should schedule her next appt. She stated she cancel the one on 01/28/24 since she's assuming it was supposed to be after her sleep study, but her sleep study hasn't been scheduled yet. She stated she just wants to make sure she's doing this in the right way. Please advise

## 2024-01-24 NOTE — Progress Notes (Signed)
 "              Carolyn Clements    [id number]    11-09-1970  Primary Care Physician:Whitaker, Selinda Moose, PA-C  Referring Physician: Gerard Frederick, NP [address], [address] [place],  [place] [address]  Chief complaint:   Patient being seen for daytime sleepiness, fatigue, nonrestorative sleep  Discussed the use of AI scribe software for clinical note transcription with the patient, who gave verbal consent to proceed.  History of Present Illness Carolyn Clements is a 54 year old female with sleep apnea and insomnia who presents for evaluation of persistent sleep disturbances. She was referred by her cardiologist for a sleep study.  She has a long-standing history of sleep disturbances, initially evaluated with a sleep study approximately 10 to 11 years ago. At that time, she experienced excessive daytime sleepiness, falling asleep at stop signs, and significant fatigue impacting her ability to work. A sleep study revealed her oxygen levels dropped to 87%, leading to a tonsillectomy after which she reported some improvement in her symptoms.  Currently, she experiences significant insomnia, describing that although she is in bed from 9 PM to 6 AM, her watch indicates she only achieves about 3 hours and 47 minutes of sleep. She wakes frequently throughout the night, approximately six to seven times, but is able to return to sleep unless her alarm goes off. She is currently taking trazodone, gabapentin , and Lamictal to aid sleep, but continues to feel unrested. No snoring is reported, and she experiences daily headaches and worsening migraines. 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  She has experienced episodes of syncope over the past ten years, with the most recent episode occurring in June or July of the previous year, leading to an ER visit where a CT and EKG were performed. The EKG was noted to  be abnormal, and she was previously diagnosed with mitral valve prolapse approximately 20 years ago.  She reports a history of chemotherapy and is currently in menopause, which she feels contributes to her memory issues.  Usually goes to bed between 9 and 10 PM Falls asleep in 20 to 30 minutes 5-6 awakenings Wakes up at 6 AM with an alarm  Did have some weight gain  Had a sleep study done in [place] 10 to 11 years ago Does not remember how severe the sleep apnea was about did remember having sats as low as 87% Had her tonsils out Symptoms did stabilize after that  Daytime symptoms, daytime sleepiness, syncopal episodes  Has seen multiple providers  Recent discussions about having a home sleep test done over 3 days currently using trazodone, gabapentin , Lamictal to help with sleep She does have a provider managing the sleep aids  She recently saw cardiology as well  Denies any significant dryness of the mouth in the morning She does have daily headaches, migraines Memory is fair but can get forgetful  Outpatient Encounter Medications as of 01/24/2024  Medication Sig   buPROPion  (WELLBUTRIN ) 100 MG tablet Take 100 mg by mouth 2 (two) times daily.   gabapentin  (NEURONTIN ) 300 MG capsule Take 300 mg by mouth at bedtime.   lamoTRIgine (LAMICTAL) 200 MG tablet Take 200 mg by mouth 2 (two) times daily.   meloxicam (MOBIC) 15 MG tablet Take 15 mg by mouth daily.   omeprazole  (PRILOSEC) 20 MG capsule Take 1 capsule (20 mg total) by mouth  every morning.   Probiotic Product (PROBIOTIC FORMULA PO) Take 1 capsule by mouth daily.   traZODone (DESYREL) 100 MG tablet Take 200 mg by mouth at bedtime.   triamcinolone (NASACORT ALLERGY 24HR) 55 MCG/ACT AERO nasal inhaler Place 2 sprays into the nose at bedtime. (Patient taking differently: Place 2 sprays into the nose at bedtime. PRN)   No facility-administered encounter medications on file as of 01/24/2024.    Allergies as of 01/24/2024 -  Review Complete 01/24/2024  Allergen Reaction Noted   Codeine sulfate Itching and Nausea And Vomiting 05/03/2013   Sulfa antibiotics Hives 11/24/2010   Tape Itching 05/07/2015   Latex Rash 11/24/2010    Past Medical History:  Diagnosis Date   Anxiety    Bipolar 1 disorder (HCC)    Dyspareunia in female    Enchondroma, multiple    GERD (gastroesophageal reflux disease)    Heart murmur    History of chemotherapy    History of methicillin resistant staphylococcus aureus (MRSA) 2010   History of radiation therapy    IDA (iron deficiency anemia)    IUD    [facility]  Dr Fredirick   Lymphedema    to left breast area-not left arm   Menorrhagia with irregular cycle    Migraine headache    Motion sickness    cars - long trips   MVP (mitral valve prolapse)    Saw cardiology for syncope 7/16-since then does not follow with cardiology   PONV (postoperative nausea and vomiting)    Pyloric stenosis infant   Rectal polyp    Screening for cervical cancer 04/2010   normal PAP    Triple negative breast cancer (HCC) 2023   Unilateral inguinal hernia in newborn    Wears contact lenses     Past Surgical History:  Procedure Laterality Date   ARTHROSCOPIC REPAIR ACL Left    torn/repaired ACL, followed by patellar fractuer, [employer]   BREAST BIOPSY Left 07/25/2021   Us  Core BX 12:00 Ribbon clip - path pending   CESAREAN SECTION     2003 and 2004   COLONOSCOPY WITH PROPOFOL  N/A 06/27/2021   Procedure: COLONOSCOPY WITH PROPOFOL ;  Surgeon: Jinny Carmine, MD;  Location: [facility];  Service: Endoscopy;  Laterality: N/A;  Latex   HERNIA REPAIR  01/05/1974   HYSTEROSCOPY WITH D & C N/A 05/07/2023   Procedure: HYSTEROSCOPY;  Surgeon: Leonce Garnette BIRCH, MD;  Location: [facility];  Service: Gynecology;  Laterality: N/A;   HYSTEROSCOPY WITH D & C  07/01/2023   Procedure: DILATATION AND CURETTAGE /HYSTEROSCOPY with myosure;  Surgeon: Leonce Garnette BIRCH, MD;   Location: [facility];  Service: Gynecology;;   IUD REMOVAL N/A 05/17/2013   Procedure: INTRAUTERINE DEVICE (IUD) REMOVAL;  Surgeon: Glenys GORMAN Fredirick, MD;  Location: [facility];  Service: Gynecology;  Laterality: N/A;  Replacement of IUD   IUD REMOVAL  05/07/2023   Procedure: REMOVAL, INTRAUTERINE DEVICE;  Surgeon: Leonce Garnette BIRCH, MD;  Location: [facility];  Service: Gynecology;;   LYMPH NODE BIOPSY     MASTECTOMY PARTIAL / LUMPECTOMY Left 2023   NASAL SINUS SURGERY     x2   OPERATIVE ULTRASOUND N/A 07/01/2023   Procedure: US  INTRAOPERATIVE;  Surgeon: Leonce Garnette BIRCH, MD;  Location: [facility];  Service: Gynecology;  Laterality: N/A;   PYLOROPLASTY  1974   TOE SURGERY Left 01/05/2002   Enchondroma removal - great toe   TONSILLECTOMY Bilateral 05/16/2015   Procedure: TONSILLECTOMY;  Surgeon:  Deward Argue, MD;  Location: [facility];  Service: ENT;  Laterality: Bilateral;  Latex sensitivity   trigger thumb surgery Right 06/2023   TUBAL LIGATION     TUMOR EXCISION N/A 04/01/2023   Procedure: TUMOR EXCISION RECTAL, polyp;  Surgeon: Jordis Laneta FALCON, MD;  Location: [facility];  Service: General;  Laterality: N/A;   XI ROBOTIC ASSISTED SALPINGECTOMY Bilateral 05/07/2023   Procedure: SALPINGECTOMY, ROBOT-ASSISTED;  Surgeon: Leonce Garnette BIRCH, MD;  Location: [facility];  Service: Gynecology;  Laterality: Bilateral;    Family History  Problem Relation Age of Onset   COPD Mother    Diabetes Mother    Hypertension Mother    Cancer Father        adenocarcinoma, nonsmoker   Cancer Maternal Grandfather        lung   Breast cancer Paternal Grandmother    Cancer Paternal Grandfather        lung    Social History   Socioeconomic History   Marital status: Married    Spouse name: Ebanks,Ted (Spouse)   Number of children: 2   Years of education: Not on file   Highest education level: Not on file  Occupational History   Occupation: physical therapist    Employer: [employer]    Comment: Physical  Therapist  Tobacco Use   Smoking status: Never    Passive exposure: Never   Smokeless tobacco: Never  Vaping Use   Vaping status: Never Used  Substance and Sexual Activity   Alcohol use: Yes    Comment: 2 drinks on weekends   Drug use: Not Currently    Types: Marijuana   Sexual activity: Yes    Birth control/protection: I.U.D.  Other Topics Concern   Not on file  Social History Narrative   Not on file   Social Drivers of Health   Tobacco Use: Low Risk (01/24/2024)   Patient History    Smoking Tobacco Use: Never    Smokeless Tobacco Use: Never    Passive Exposure: Never  Financial Resource Strain: Low Risk  (10/07/2023)   Received from [facility]   Overall Financial Resource Strain (CARDIA)    Difficulty of Paying Living Expenses: Not hard at all  Food Insecurity: No Food Insecurity (10/07/2023)   Received from [facility]   Epic    Within the past 12 months, you worried that your food would run out before you got the money to buy more.: Never true    Within the past 12 months, the food you bought just didn't last and you didn't have money to get more.: Never true  Transportation Needs: No Transportation Needs (10/07/2023)   Received from [facility] - Transportation    In the past 12 months, has lack of transportation kept you from medical appointments or from getting medications?: No    Lack of Transportation (Non-Medical): No  Physical Activity: Not on file  Stress: Not on file  Social Connections: Not on file  Intimate Partner Violence: Not on file  Depression (EYV7-0): Not on file  Alcohol Screen: Not on file  Housing: Unknown (10/07/2023)   Received from [facility]   Epic    In the last 12 months, was there a time when you were not able to pay the mortgage or rent on time?: No    Number of Times Moved in the Last Year: Not on file    At any time in the  past 12 months, were you  homeless or living in a shelter (including now)?: No  Utilities: Not At Risk (10/07/2023)   Received from [facility]   Epic    In the past 12 months has the electric, gas, oil, or water  company threatened to shut off services in your home?: No  Health Literacy: Not on file    Review of Systems  Respiratory:  Positive for cough.   Psychiatric/Behavioral:  Positive for sleep disturbance.     Vitals:   01/24/24 0822  BP: 117/77  Pulse: 63  Temp: 97.8 F (36.6 C)  SpO2: 99%     Physical Exam Constitutional:      Appearance: Normal appearance.  HENT:     Head: Normocephalic.     Nose: Nose normal.     Mouth/Throat:     Mouth: Mucous membranes are moist.  Eyes:     General: No scleral icterus. Cardiovascular:     Rate and Rhythm: Normal rate and regular rhythm.     Heart sounds: No murmur heard.    No friction rub.  Pulmonary:     Effort: No respiratory distress.     Breath sounds: No stridor. No wheezing or rhonchi.  Musculoskeletal:     Cervical back: No rigidity or tenderness.  Skin:    General: Skin is warm.  Neurological:     General: No focal deficit present.     Mental Status: She is alert.  Psychiatric:        Mood and Affect: Mood normal.    Epworth Sleepiness Scale of 10  Data Reviewed: Previous sleep study not available to be reviewed  Assessment and Plan Assessment & Plan Insomnia Chronic insomnia with difficulty maintaining sleep, waking up multiple times per night, and not feeling rested. Currently on trazodone, gabapentin , and Lamictal. Trazodone may contribute to daytime grogginess. Cognitive behavioral therapy (CBT) for insomnia is recommended to improve sleep quality without medication side effects. Medications primarily help with sleep onset and maintenance and may not necessarily increase total sleep duration significantly enough, one also has to contend with the side effects  - Referred to cognitive behavioral therapist for  insomnia management - Continue current medications with neuropsychiatrist oversight - May need medication adjustments to get better sleep  Suspected sleep apnea Previous sleep study 10-11 years ago showed oxygen desaturation to 87%. Tonsillectomy performed due to enlarged tonsils. Current symptoms include daytime sleepiness and frequent awakenings at night. Differential includes residual sleep apnea or other sleep disorders. In-lab sleep study is preferred over home sleep study for accurate assessment due to variability in home study results and potential suboptimal data capture. - Scheduled in-lab sleep study to assess for sleep apnea - Will go ahead and cancel home sleep study - From what she described, previous home sleep study may have been mild with oxygen desaturations and she did have an intervention for it already - An in-lab study would be the most appropriate study in this situation especially with a noncontinuous, nonrestorative sleep    Most significant impact would be seen a cognitive behavioral therapist for insomnia as this will have long-lasting impact on her quality of sleep  History of migraines History of generalized anxiety disorder History of bipolar disorder -Inadequate nonrestorative sleep may make above more symptomatic   Orders Placed This Encounter  Procedures   Polysomnography 4 or more parameters (NPSG)    Standing Status:   Future    Expiration Date:   01/23/2025  Where should this test be performed::   [facility]     Jennet Epley MD [facility] 01/24/2024, 8:54 AM  CC: Gerard Frederick, NP   "

## 2024-01-24 NOTE — Patient Instructions (Addendum)
 Nonrestorative sleep Excessive daytime sleepiness Insomnia  Important to make sure you do not have sleep apnea-with having an abnormal study in the past that led to your tonsils been taken out -Best way to figure this out is by doing an in-lab study-most accurate study to figure this out -Home sleep study would not be the appropriate study in your situation  Insomnia -Following up with a cognitive behavioral therapist will help you the most -No medications, less worry about side effects of medications, long-lasting effects  - Short-term, medications can be adjusted to help you get better sleep  Follow-up in about 6 to 8 weeks  We will schedule you for an in-lab study  Call us  with significant concerns

## 2024-01-24 NOTE — Telephone Encounter (Signed)
 Called patient - left message on identified voicemail that she was correct in canceling OV, reached out to [place] office to find out status of watchpat - advised patient to reschedule once watchpat was completed

## 2024-01-25 NOTE — Telephone Encounter (Signed)
 Patient calling back to say the pulmonary dr kerrin want her to do the watch man. Instead to do the full sleep study with him. Please advise

## 2024-01-25 NOTE — Telephone Encounter (Signed)
 If pulmonary is requesting a full study she would get more benefit from that versus the [place]. Please cancel the Watchpat order and have her continue with the pulmonary recommendations. Thanks, [employer]

## 2024-01-25 NOTE — Telephone Encounter (Signed)
 Thanks

## 2024-01-28 ENCOUNTER — Ambulatory Visit: Admitting: Cardiology

## 2024-03-24 ENCOUNTER — Ambulatory Visit (HOSPITAL_BASED_OUTPATIENT_CLINIC_OR_DEPARTMENT_OTHER): Admitting: Pulmonary Disease

## 2024-04-05 ENCOUNTER — Ambulatory Visit: Admitting: Cardiology

## 2024-04-27 ENCOUNTER — Ambulatory Visit: Admitting: Pulmonary Disease
